# Patient Record
Sex: Female | Born: 1995 | Race: Black or African American | Hispanic: No | Marital: Single | Smoking: Current every day smoker
Health system: Southern US, Community
[De-identification: ages and names within clinical notes are randomized; demographics above are authoritative.]

## PROBLEM LIST (undated history)

## (undated) ENCOUNTER — Inpatient Hospital Stay (HOSPITAL_COMMUNITY): Payer: Self-pay

## (undated) DIAGNOSIS — B999 Unspecified infectious disease: Secondary | ICD-10-CM

## (undated) DIAGNOSIS — J45909 Unspecified asthma, uncomplicated: Secondary | ICD-10-CM

## (undated) DIAGNOSIS — A749 Chlamydial infection, unspecified: Secondary | ICD-10-CM

## (undated) HISTORY — PX: NO PAST SURGERIES: SHX2092

---
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
## 2014-01-07 ENCOUNTER — Encounter: Payer: Self-pay | Admitting: Family Medicine

## 2014-01-07 ENCOUNTER — Ambulatory Visit (INDEPENDENT_AMBULATORY_CARE_PROVIDER_SITE_OTHER): Payer: Medicaid Other | Admitting: Family Medicine

## 2014-01-07 VITALS — BP 109/64 | HR 99 | Temp 97.8°F | Wt 183.9 lb

## 2014-01-07 DIAGNOSIS — F1920 Other psychoactive substance dependence, uncomplicated: Secondary | ICD-10-CM

## 2014-01-07 DIAGNOSIS — Z3480 Encounter for supervision of other normal pregnancy, unspecified trimester: Secondary | ICD-10-CM

## 2014-01-07 DIAGNOSIS — O99320 Drug use complicating pregnancy, unspecified trimester: Secondary | ICD-10-CM

## 2014-01-07 DIAGNOSIS — F1210 Cannabis abuse, uncomplicated: Secondary | ICD-10-CM

## 2014-01-07 DIAGNOSIS — Z3491 Encounter for supervision of normal pregnancy, unspecified, first trimester: Secondary | ICD-10-CM

## 2014-01-07 LAB — POCT URINALYSIS DIP (DEVICE)
Bilirubin Urine: NEGATIVE
GLUCOSE, UA: NEGATIVE mg/dL
HGB URINE DIPSTICK: NEGATIVE
Ketones, ur: NEGATIVE mg/dL
Nitrite: NEGATIVE
Protein, ur: 30 mg/dL — AB
Specific Gravity, Urine: 1.02 (ref 1.005–1.030)
UROBILINOGEN UA: 1 mg/dL (ref 0.0–1.0)
pH: 5.5 (ref 5.0–8.0)

## 2014-01-07 NOTE — Progress Notes (Signed)
S: 18 yo G1 @ 9883w2d here for ROBV - reviewed US report- complete and normal - no vb, lof, ctx. +FM  O: see flowsheet  A/P - doing well  - bleeding and PTL precautions discussed - f/u 4weeks for 28 week visit and labs.

## 2014-01-07 NOTE — Patient Instructions (Signed)
Second Trimester of Pregnancy The second trimester is from week 13 through week 28, months 4 through 6. The second trimester is often a time when you feel your best. Your body has also adjusted to being pregnant, and you begin to feel better physically. Usually, morning sickness has lessened or quit completely, you may have more energy, and you may have an increase in appetite. The second trimester is also a time when the fetus is growing rapidly. At the end of the sixth month, the fetus is about 9 inches long and weighs about 1 pounds. You will likely begin to feel the baby move (quickening) between 18 and 20 weeks of the pregnancy. BODY CHANGES Your body goes through many changes during pregnancy. The changes vary from woman to woman.   Your weight will continue to increase. You will notice your lower abdomen bulging out.  You may begin to get stretch marks on your hips, abdomen, and breasts.  You may develop headaches that can be relieved by medicines approved by your caregiver.  You may urinate more often because the fetus is pressing on your bladder.  You may develop or continue to have heartburn as a result of your pregnancy.  You may develop constipation because certain hormones are causing the muscles that push waste through your intestines to slow down.  You may develop hemorrhoids or swollen, bulging veins (varicose veins).  You may have back pain because of the weight gain and pregnancy hormones relaxing your joints between the bones in your pelvis and as a result of a shift in weight and the muscles that support your balance.  Your breasts will continue to grow and be tender.  Your gums may bleed and may be sensitive to brushing and flossing.  Dark spots or blotches (chloasma, mask of pregnancy) may develop on your face. This will likely fade after the baby is born.  A dark line from your belly button to the pubic area (linea nigra) may appear. This will likely fade after the  baby is born. WHAT TO EXPECT AT YOUR PRENATAL VISITS During a routine prenatal visit:  You will be weighed to make sure you and the fetus are growing normally.  Your blood pressure will be taken.  Your abdomen will be measured to track your baby's growth.  The fetal heartbeat will be listened to.  Any test results from the previous visit will be discussed. Your caregiver may ask you:  How you are feeling.  If you are feeling the baby move.  If you have had any abnormal symptoms, such as leaking fluid, bleeding, severe headaches, or abdominal cramping.  If you have any questions. Other tests that may be performed during your second trimester include:  Blood tests that check for:  Low iron levels (anemia).  Gestational diabetes (between 24 and 28 weeks).  Rh antibodies.  Urine tests to check for infections, diabetes, or protein in the urine.  An ultrasound to confirm the proper growth and development of the baby.  An amniocentesis to check for possible genetic problems.  Fetal screens for spina bifida and Down syndrome. HOME CARE INSTRUCTIONS   Avoid all smoking, herbs, alcohol, and unprescribed drugs. These chemicals affect the formation and growth of the baby.  Follow your caregiver's instructions regarding medicine use. There are medicines that are either safe or unsafe to take during pregnancy.  Exercise only as directed by your caregiver. Experiencing uterine cramps is a good sign to stop exercising.  Continue to eat regular,   healthy meals.  Wear a good support bra for breast tenderness.  Do not use hot tubs, steam rooms, or saunas.  Wear your seat belt at all times when driving.  Avoid raw meat, uncooked cheese, cat litter boxes, and soil used by cats. These carry germs that can cause birth defects in the baby.  Take your prenatal vitamins.  Try taking a stool softener (if your caregiver approves) if you develop constipation. Eat more high-fiber foods,  such as fresh vegetables or fruit and whole grains. Drink plenty of fluids to keep your urine clear or pale yellow.  Take warm sitz baths to soothe any pain or discomfort caused by hemorrhoids. Use hemorrhoid cream if your caregiver approves.  If you develop varicose veins, wear support hose. Elevate your feet for 15 minutes, 3 4 times a day. Limit salt in your diet.  Avoid heavy lifting, wear low heel shoes, and practice good posture.  Rest with your legs elevated if you have leg cramps or low back pain.  Visit your dentist if you have not gone yet during your pregnancy. Use a soft toothbrush to brush your teeth and be gentle when you floss.  A sexual relationship may be continued unless your caregiver directs you otherwise.  Continue to go to all your prenatal visits as directed by your caregiver. SEEK MEDICAL CARE IF:   You have dizziness.  You have mild pelvic cramps, pelvic pressure, or nagging pain in the abdominal area.  You have persistent nausea, vomiting, or diarrhea.  You have a bad smelling vaginal discharge.  You have pain with urination. SEEK IMMEDIATE MEDICAL CARE IF:   You have a fever.  You are leaking fluid from your vagina.  You have spotting or bleeding from your vagina.  You have severe abdominal cramping or pain.  You have rapid weight gain or loss.  You have shortness of breath with chest pain.  You notice sudden or extreme swelling of your face, hands, ankles, feet, or legs.  You have not felt your baby move in over an hour.  You have severe headaches that do not go away with medicine.  You have vision changes. Document Released: 08/15/2001 Document Revised: 04/23/2013 Document Reviewed: 10/22/2012 [employer] Patient Information 2014 [employer].  

## 2014-01-13 ENCOUNTER — Other Ambulatory Visit: Payer: Medicaid Other

## 2014-01-13 ENCOUNTER — Telehealth: Payer: Self-pay | Admitting: Obstetrics & Gynecology

## 2014-01-13 NOTE — Telephone Encounter (Signed)
Called patient and left message about her appointment she missed, and that she should call us back to reschedule.

## 2014-01-17 ENCOUNTER — Encounter: Payer: Self-pay | Admitting: *Deleted

## 2014-02-04 ENCOUNTER — Encounter: Payer: Self-pay | Admitting: Family Medicine

## 2014-02-04 ENCOUNTER — Ambulatory Visit (INDEPENDENT_AMBULATORY_CARE_PROVIDER_SITE_OTHER): Payer: Medicaid Other | Admitting: Family Medicine

## 2014-02-04 VITALS — BP 118/69 | HR 96 | Temp 97.0°F | Wt 190.4 lb

## 2014-02-04 DIAGNOSIS — Z3480 Encounter for supervision of other normal pregnancy, unspecified trimester: Secondary | ICD-10-CM

## 2014-02-04 DIAGNOSIS — Z3491 Encounter for supervision of normal pregnancy, unspecified, first trimester: Secondary | ICD-10-CM

## 2014-02-04 DIAGNOSIS — Z3490 Encounter for supervision of normal pregnancy, unspecified, unspecified trimester: Secondary | ICD-10-CM

## 2014-02-04 DIAGNOSIS — Z23 Encounter for immunization: Secondary | ICD-10-CM

## 2014-02-04 MED ORDER — TETANUS-DIPHTH-ACELL PERTUSSIS 5-2.5-18.5 LF-MCG/0.5 IM SUSP: 0.5000 mL | Freq: Once | INTRAMUSCULAR | Status: DC

## 2014-02-04 MED ORDER — BENZOYL PEROXIDE 2.5 % EX CREA: 1.0000 "application " | TOPICAL_CREAM | Freq: Every day | CUTANEOUS | Status: DC

## 2014-02-04 NOTE — Progress Notes (Signed)
+  FM, no LOF, no vb, no ctx Back and abdominal soreness, not contx. Pt attributes to sleeping position, improves with movement  Carla Rollins is a 18 y.o. G1P0000 at [redacted]w[redacted]d by early [place] here for ROB visit.  Discussed with Patient:  -Plans to breast feed.  All questions answered. -Continue prenatal vitamins. - Reviewed genetics screen (Quad screen  -Reviewed fetal kick counts (Pt to perform daily at a time when the baby is active, lie laterally with both hands on belly in quiet room and count all movements (hiccups, shoulder rolls, obvious kicks, etc); pt is to report to clinic or MAU for less than 10 movements felt in a one hour time period-pt told as soon as she counts 10 movements the count is complete.)  - Routine precautions discussed (depression, infection s/s).   Patient provided with all pertinent phone numbers for emergencies. - RTC for any VB, regular, painful cramps/ctxs occurring at a rate of >2/10 min, fever (100.5 or higher), n/v/d, any pain that is unresolving or worsening, LOF, decreased fetal movement, CP, SOB, edema  Problems: Patient Active Problem List   Diagnosis Date Noted  . Marijuana abuse 10/17/2013  . Supervision of normal pregnancy in first trimester 10/09/2013  . PYELONEPHRITIS 12/08/2009  . OVERWEIGHT 07/27/2009  . 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 ALLERGIC RHINITIS WITH CONJUNCTIVITIS 07/27/2009  . ASTHMA 07/27/2009  . MENORRHAGIA 07/27/2009  . ECZEMA 07/27/2009    To Do: 1. Glucose tolerance test ordered.  Patient will draw in clinic.  Will f/u test and amend plan based on results. 2. CBC, labs and antibody screen ordered. 3.  Benzyl Peroxide for Acne  [ ]  Vaccines: recd [ ]  BCM: mirena  Edu: [x ] PTL precautions; [ ]  BF class; [ ]  childbirth class; [ ]   BF counseling;

## 2014-02-04 NOTE — Progress Notes (Signed)
Pt reports pain in abd/back for the past 3 days

## 2014-02-04 NOTE — Patient Instructions (Signed)
Third Trimester of Pregnancy The third trimester is from week 29 through week 42, months 7 through 9. The third trimester is a time when the fetus is growing rapidly. At the end of the ninth month, the fetus is about 20 inches in length and weighs 6 10 pounds.  BODY CHANGES Your body goes through many changes during pregnancy. The changes vary from woman to woman.   Your weight will continue to increase. You can expect to gain 25 35 pounds (11 16 kg) by the end of the pregnancy.  You may begin to get stretch marks on your hips, abdomen, and breasts.  You may urinate more often because the fetus is moving lower into your pelvis and pressing on your bladder.  You may develop or continue to have heartburn as a result of your pregnancy.  You may develop constipation because certain hormones are causing the muscles that push waste through your intestines to slow down.  You may develop hemorrhoids or swollen, bulging veins (varicose veins).  You may have pelvic pain because of the weight gain and pregnancy hormones relaxing your joints between the bones in your pelvis. Back aches may result from over exertion of the muscles supporting your posture.  Your breasts will continue to grow and be tender. A yellow discharge may leak from your breasts called colostrum.  Your belly button may stick out.  You may feel short of breath because of your expanding uterus.  You may notice the fetus "dropping," or moving lower in your abdomen.  You may have a bloody mucus discharge. This usually occurs a few days to a week before labor begins.  Your cervix becomes thin and soft (effaced) near your due date. WHAT TO EXPECT AT YOUR PRENATAL EXAMS  You will have prenatal exams every 2 weeks until week 36. Then, you will have weekly prenatal exams. During a routine prenatal visit:  You will be weighed to make sure you and the fetus are growing normally.  Your blood pressure is taken.  Your abdomen will be  measured to track your baby's growth.  The fetal heartbeat will be listened to.  Any test results from the previous visit will be discussed.  You may have a cervical check near your due date to see if you have effaced. At around 36 weeks, your caregiver will check your cervix. At the same time, your caregiver will also perform a test on the secretions of the vaginal tissue. This test is to determine if a type of bacteria, Group B streptococcus, is present. Your caregiver will explain this further. Your caregiver may ask you:  What your birth plan is.  How you are feeling.  If you are feeling the baby move.  If you have had any abnormal symptoms, such as leaking fluid, bleeding, severe headaches, or abdominal cramping.  If you have any questions. Other tests or screenings that may be performed during your third trimester include:  Blood tests that check for low iron levels (anemia).  Fetal testing to check the health, activity level, and growth of the fetus. Testing is done if you have certain medical conditions or if there are problems during the pregnancy. FALSE LABOR You may feel small, irregular contractions that eventually go away. These are called Braxton Hicks contractions, or false labor. Contractions may last for hours, days, or even weeks before true labor sets in. If contractions come at regular intervals, intensify, or become painful, it is best to be seen by your caregiver.  SIGNS OF LABOR   Menstrual-like cramps.  Contractions that are 5 minutes apart or less.  Contractions that start on the top of the uterus and spread down to the lower abdomen and back.  A sense of increased pelvic pressure or back pain.  A watery or bloody mucus discharge that comes from the vagina. If you have any of these signs before the 37th week of pregnancy, call your caregiver right away. You need to go to the hospital to get checked immediately. HOME CARE INSTRUCTIONS   Avoid all  smoking, herbs, alcohol, and unprescribed drugs. These chemicals affect the formation and growth of the baby.  Follow your caregiver's instructions regarding medicine use. There are medicines that are either safe or unsafe to take during pregnancy.  Exercise only as directed by your caregiver. Experiencing uterine cramps is a good sign to stop exercising.  Continue to eat regular, healthy meals.  Wear a good support bra for breast tenderness.  Do not use hot tubs, steam rooms, or saunas.  Wear your seat belt at all times when driving.  Avoid raw meat, uncooked cheese, cat litter boxes, and soil used by cats. These carry germs that can cause birth defects in the baby.  Take your prenatal vitamins.  Try taking a stool softener (if your caregiver approves) if you develop constipation. Eat more high-fiber foods, such as fresh vegetables or fruit and whole grains. Drink plenty of fluids to keep your urine clear or pale yellow.  Take warm sitz baths to soothe any pain or discomfort caused by hemorrhoids. Use hemorrhoid cream if your caregiver approves.  If you develop varicose veins, wear support hose. Elevate your feet for 15 minutes, 3 4 times a day. Limit salt in your diet.  Avoid heavy lifting, wear low heal shoes, and practice good posture.  Rest a lot with your legs elevated if you have leg cramps or low back pain.  Visit your dentist if you have not gone during your pregnancy. Use a soft toothbrush to brush your teeth and be gentle when you floss.  A sexual relationship may be continued unless your caregiver directs you otherwise.  Do not travel far distances unless it is absolutely necessary and only with the approval of your caregiver.  Take prenatal classes to understand, practice, and ask questions about the labor and delivery.  Make a trial run to the hospital.  Pack your hospital bag.  Prepare the baby's nursery.  Continue to go to all your prenatal visits as directed  by your caregiver. SEEK MEDICAL CARE IF:  You are unsure if you are in labor or if your water has broken.  You have dizziness.  You have mild pelvic cramps, pelvic pressure, or nagging pain in your abdominal area.  You have persistent nausea, vomiting, or diarrhea.  You have a bad smelling vaginal discharge.  You have pain with urination. SEEK IMMEDIATE MEDICAL CARE IF:   You have a fever.  You are leaking fluid from your vagina.  You have spotting or bleeding from your vagina.  You have severe abdominal cramping or pain.  You have rapid weight loss or gain.  You have shortness of breath with chest pain.  You notice sudden or extreme swelling of your face, hands, ankles, feet, or legs.  You have not felt your baby move in over an hour.  You have severe headaches that do not go away with medicine.  You have vision changes. Document Released: 08/15/2001 Document Revised: 04/23/2013 Document Reviewed:  10/22/2012 Exit[employer] Patient Information 2014 [employer]. Benzoyl Peroxide skin cream, gel or lotion What is this medicine? BENZOYL PEROXIDE (BEN zoe ill per OX ide) is used on the skin to treat mild to moderate acne. This medicine may be used for other purposes; ask your health care provider or pharmacist if you have questions. COMMON BRAND NAME(S): Acne Medication, Acne-10, Acneclear, Benprox , Benzac AC, Benzac W, Benzac, Benzagel, BenzaShave, BenzEFoam Ultra , BenzEFoam, BenzePrO, Benziq LS, Benziq, BP Cleansing Lotion, BP Gel, BPO, Brevoxyl-4, Brevoxyl-8, Clearasil Ultra, Clearasil Vanishing, Clearasil, Clearplex , Clearplex X, Clearskin, Clinac BPO, [place], [place], [place], [place], [place], [place] Acne Wash Kit, [place]-8 Acne Wash Kit, NeoBenz Micro Cream Plus Pack, NeoBenz Micro SD, [employer], NeoBenz, OC8, [place], PanOxyl AQ, PanOxyl Aqua, PanOxyl, RE Benzoyl Peroxide , Riax, Seba, Seba-Gel, Soluclenz Rx , Theroxide, Triaz, Zoderm What should I tell my  health care provider before I take this medicine? They need to know if you have any of these conditions: -asthma -skin disease, abrasions, irritation or infection -sunburn -an unusual or allergic reaction to benzoic acid, cinnamon, parabens, sulfites, other medicines, foods, dyes, or preservatives -pregnant or trying to get pregnant -breast-feeding How should I use this medicine? This medicine is for external use only. Do not take by mouth. Follow the directions on the prescription label. Before using, wash affected area with a gentle cleanser and pat dry. Do not apply to raw or irritated skin. Apply enough medicine to cover the area and rub in gently. Avoid getting medicine in your eyes, lips, nose, mouth, or other sensitive areas. Do not wash treated areas of skin for at least 1 hour after using the medicine. If you experience very dry and peeling skin or skin irritation, talk to your doctor or health care professional. Talk to your pediatrician or health care professional regarding the use of this medicine in children. Special care may be needed. Overdosage: If you think you have taken too much of this medicine contact a poison control center or emergency room at once. NOTE: This medicine is only for you. Do not share this medicine with others. What if I miss a dose? If you miss a dose, use it as soon as you can. If it is almost time for your next dose, use only that dose. Do not use double or extra doses. What may interact with this medicine? -adapalene -isotretinoin -salicylic acid or sulfur containing products -topical antibiotics such as clindamycin or erythromycin -tretinoin This list may not describe all possible interactions. Give your health care provider a list of all the medicines, herbs, non-prescription drugs, or dietary supplements you use. Also tell them if you smoke, drink alcohol, or use illegal drugs. Some items may interact with your medicine. What should I watch for while  using this medicine? Your acne may get worse during the first few weeks of treatment, and then start to get better. It may take 8 to 12 weeks before you see the full effect. If you do not see any improvement within 4 to 6 weeks, call your doctor or health care professional. Once you see a decrease in your acne, you may need to continue to use this medicine to control it. Do not use products that may dry the skin like medicated cosmetics, products that contain alcohol, or abrasive soaps or cleaners. Do not use other acne or skin treatment on the same area that you use this medicine unless your doctor or health care professional tells you to. If you use  these together they can cause severe skin irritation. This medicine can make you more sensitive to the sun. Keep out of the sun. If you cannot avoid being in the sun, wear protective clothing and use sunscreen. Do not use sun lamps or tanning beds/booths. This medicine may bleach hair or colored fabrics. Avoid getting the medicine on your clothes. What side effects may I notice from receiving this medicine? Side effects that you should report to your doctor or health care professional as soon as possible: -allergic reactions like skin rash, itching or hives, swelling of the face, lips, or tongue -severe burning, itching, reddening, crusting, or swelling of the treated areas Side effects that usually do not require medical attention (report to your doctor or health care professional if they continue or are bothersome): -increased sensitivity to the sun -mild burning or stinging of the treated areas -red, inflamed, and irritated skin This list may not describe all possible side effects. Call your doctor for medical advice about side effects. You may report side effects to FDA at 1-800-FDA-1088. Where should I keep my medicine? Keep out of the reach of children. Store at room temperature between 15 and 30 degrees C (59 and 86 degrees F). Throw away any  unused medication after the expiration date. NOTE: This sheet is a summary. It may not cover all possible information. If you have questions about this medicine, talk to your doctor, pharmacist, or health care provider.  2014, [employer]. (2007-11-20 16:11:05)

## 2014-02-05 LAB — CBC
HCT: 28.5 % — ABNORMAL LOW (ref 36.0–46.0)
Hemoglobin: 9.7 g/dL — ABNORMAL LOW (ref 12.0–15.0)
MCH: 29.4 pg (ref 26.0–34.0)
MCHC: 34 g/dL (ref 30.0–36.0)
MCV: 86.4 fL (ref 78.0–100.0)
PLATELETS: 256 10*3/uL (ref 150–400)
RBC: 3.3 MIL/uL — ABNORMAL LOW (ref 3.87–5.11)
RDW: 13.1 % (ref 11.5–15.5)
WBC: 10.2 10*3/uL (ref 4.0–10.5)

## 2014-02-05 LAB — POCT URINALYSIS DIP (DEVICE)
Bilirubin Urine: NEGATIVE
Glucose, UA: NEGATIVE mg/dL
Hgb urine dipstick: NEGATIVE
Ketones, ur: NEGATIVE mg/dL
NITRITE: NEGATIVE
Protein, ur: NEGATIVE mg/dL
Specific Gravity, Urine: 1.02 (ref 1.005–1.030)
Urobilinogen, UA: 0.2 mg/dL (ref 0.0–1.0)
pH: 7 (ref 5.0–8.0)

## 2014-02-05 LAB — RPR

## 2014-02-05 LAB — HIV ANTIBODY (ROUTINE TESTING W REFLEX): HIV 1&2 Ab, 4th Generation: NONREACTIVE

## 2014-02-05 LAB — GLUCOSE TOLERANCE, 1 HOUR (50G) W/O FASTING: Glucose, 1 Hour GTT: 78 mg/dL (ref 70–140)

## 2014-02-17 ENCOUNTER — Encounter: Payer: Self-pay | Admitting: Obstetrics and Gynecology

## 2014-02-17 ENCOUNTER — Ambulatory Visit (INDEPENDENT_AMBULATORY_CARE_PROVIDER_SITE_OTHER): Payer: Medicaid Other | Admitting: Obstetrics and Gynecology

## 2014-02-17 VITALS — BP 124/64 | HR 100 | Temp 97.6°F | Wt 190.9 lb

## 2014-02-17 DIAGNOSIS — Z23 Encounter for immunization: Secondary | ICD-10-CM

## 2014-02-17 DIAGNOSIS — Z3480 Encounter for supervision of other normal pregnancy, unspecified trimester: Secondary | ICD-10-CM

## 2014-02-17 DIAGNOSIS — Z3491 Encounter for supervision of normal pregnancy, unspecified, first trimester: Secondary | ICD-10-CM

## 2014-02-17 LAB — POCT URINALYSIS DIP (DEVICE)
Glucose, UA: NEGATIVE mg/dL
Hgb urine dipstick: NEGATIVE
Ketones, ur: NEGATIVE mg/dL
LEUKOCYTES UA: NEGATIVE
Nitrite: NEGATIVE
PROTEIN: 30 mg/dL — AB
Specific Gravity, Urine: 1.025 (ref 1.005–1.030)
Urobilinogen, UA: 1 mg/dL (ref 0.0–1.0)
pH: 7 (ref 5.0–8.0)

## 2014-02-17 NOTE — Progress Notes (Signed)
Has ice PICA. Takes Gummy vits. hgb 9.7, MCV 86> add iron supplement bid. Proteinuria> No UTI sx; check urine C&S Has quit work. No concerns.  Discussed Nexplanon, breastfeeding, circ.  UDS done

## 2014-02-17 NOTE — Patient Instructions (Signed)
Breastfeeding Deciding to breastfeed is one of the best choices you can make for you and your baby. A change in hormones during pregnancy causes your breast tissue to grow and increases the number and size of your milk ducts. These hormones also allow proteins, sugars, and fats from your blood supply to make breast milk in your milk-producing glands. Hormones prevent breast milk from being released before your baby is born as well as prompt milk flow after birth. Once breastfeeding has begun, thoughts of your baby, as well as his or her sucking or crying, can stimulate the release of milk from your milk-producing glands.  BENEFITS OF BREASTFEEDING For Your Baby  Your first milk (colostrum) helps your baby's digestive system function better.   There are antibodies in your milk that help your baby fight off infections.   Your baby has a lower incidence of asthma, allergies, and sudden infant death syndrome.   The nutrients in breast milk are better for your baby than infant formulas and are designed uniquely for your baby's needs.   Breast milk improves your baby's brain development.   Your baby is less likely to develop other conditions, such as childhood obesity, asthma, or type 2 diabetes mellitus.  For You   Breastfeeding helps to create a very special bond between you and your baby.   Breastfeeding is convenient. Breast milk is always available at the correct temperature and costs nothing.   Breastfeeding helps to burn calories and helps you lose the weight gained during pregnancy.   Breastfeeding makes your uterus contract to its prepregnancy size faster and slows bleeding (lochia) after you give birth.   Breastfeeding helps to lower your risk of developing type 2 diabetes mellitus, osteoporosis, and breast or ovarian cancer later in life. SIGNS THAT YOUR BABY IS HUNGRY Early Signs of Hunger  Increased alertness or activity.  Stretching.  Movement of the head from  side to side.  Movement of the head and opening of the mouth when the corner of the mouth or cheek is stroked (rooting).  Increased sucking sounds, smacking lips, cooing, sighing, or squeaking.  Hand-to-mouth movements.  Increased sucking of fingers or hands. Late Signs of Hunger  Fussing.  Intermittent crying. Extreme Signs of Hunger Signs of extreme hunger will require calming and consoling before your baby will be able to breastfeed successfully. Do not wait for the following signs of extreme hunger to occur before you initiate breastfeeding:   Restlessness.  A loud, strong cry.   Screaming. BREASTFEEDING BASICS Breastfeeding Initiation  Find a comfortable place to sit or lie down, with your neck and back well supported.  Place a pillow or rolled up blanket under your baby to bring him or her to the level of your breast (if you are seated). Nursing pillows are specially designed to help support your arms and your baby while you breastfeed.  Make sure that your baby's abdomen is facing your abdomen.   Gently massage your breast. With your fingertips, massage from your chest wall toward your nipple in a circular motion. This encourages milk flow. You may need to continue this action during the feeding if your milk flows slowly.  Support your breast with 4 fingers underneath and your thumb above your nipple. Make sure your fingers are well away from your nipple and your baby's mouth.   Stroke your baby's lips gently with your finger or nipple.   When your baby's mouth is open wide enough, quickly bring your baby to your   breast, placing your entire nipple and as much of the colored area around your nipple (areola) as possible into your baby's mouth.   More areola should be visible above your baby's upper lip than below the lower lip.   Your baby's tongue should be between his or her lower gum and your breast.   Ensure that your baby's mouth is correctly positioned  around your nipple (latched). Your baby's lips should create a seal on your breast and be turned out (everted).  It is common for your baby to suck about 2 3 minutes in order to start the flow of breast milk. Latching Teaching your baby how to latch on to your breast properly is very important. An improper latch can cause nipple pain and decreased milk supply for you and poor weight gain in your baby. Also, if your baby is not latched onto your nipple properly, he or she may swallow some air during feeding. This can make your baby fussy. Burping your baby when you switch breasts during the feeding can help to get rid of the air. However, teaching your baby to latch on properly is still the best way to prevent fussiness from swallowing air while breastfeeding. Signs that your baby has successfully latched on to your nipple:    Silent tugging or silent sucking, without causing you pain.   Swallowing heard between every 3 4 sucks.    Muscle movement above and in front of his or her ears while sucking.  Signs that your baby has not successfully latched on to nipple:   Sucking sounds or smacking sounds from your baby while breastfeeding.  Nipple pain. If you think your baby has not latched on correctly, slip your finger into the corner of your baby's mouth to break the suction and place it between your baby's gums. Attempt breastfeeding initiation again. Signs of Successful Breastfeeding Signs from your baby:   A gradual decrease in the number of sucks or complete cessation of sucking.   Falling asleep.   Relaxation of his or her body.   Retention of a small amount of milk in his or her mouth.   Letting go of your breast by himself or herself. Signs from you:  Breasts that have increased in firmness, weight, and size 1 3 hours after feeding.   Breasts that are softer immediately after breastfeeding.  Increased milk volume, as well as a change in milk consistency and color by  the 5th day of breastfeeding.   Nipples that are not sore, cracked, or bleeding. Signs That Your Randel Books is Getting Enough Milk  Wetting at least 3 diapers in a 24-hour period. The urine should be clear and pale yellow by age 64411 days.  At least 3 stools in a 24-hour period by age 64411 days. The stool should be soft and yellow.  At least 3 stools in a 24-hour period by age 644 days. The stool should be seedy and yellow.  No loss of weight greater than 10% of birth weight during the first 22 days of age.  Average weight gain of 4 7 ounces (120 210 mL) per week after age 64 days.  Consistent daily weight gain by age 60 days, without weight loss after the age of 2 weeks. After a feeding, your baby may spit up a small amount. This is common. BREASTFEEDING FREQUENCY AND DURATION Frequent feeding will help you make more milk and can prevent sore nipples and breast engorgement. Breastfeed when you feel the need to reduce  the fullness of your breasts or when your baby shows signs of hunger. This is called "breastfeeding on demand." Avoid introducing a pacifier to your baby while you are working to establish breastfeeding (the first 4 6 weeks after your baby is born). After this time you may choose to use a pacifier. Research has shown that pacifier use during the first year of a baby's life decreases the risk of sudden infant death syndrome (SIDS). Allow your baby to feed on each breast as long as he or she wants. Breastfeed until your baby is finished feeding. When your baby unlatches or falls asleep while feeding from the first breast, offer the second breast. Because newborns are often sleepy in the first few weeks of life, you may need to awaken your baby to get him or her to feed. Breastfeeding times will vary from baby to baby. However, the following rules can serve as a guide to help you ensure that your baby is properly fed:  Newborns (babies 4 weeks of age or younger) may breastfeed every 1 3  hours.  Newborns should not go longer than 3 hours during the day or 5 hours during the night without breastfeeding.  You should breastfeed your baby a minimum of 8 times in a 24-hour period until you begin to introduce solid foods to your baby at around 6 months of age. BREAST MILK PUMPING Pumping and storing breast milk allows you to ensure that your baby is exclusively fed your breast milk, even at times when you are unable to breastfeed. This is especially important if you are going back to work while you are still breastfeeding or when you are not able to be present during feedings. Your lactation consultant can give you guidelines on how long it is safe to store breast milk.  A breast pump is a machine that allows you to pump milk from your breast into a sterile bottle. The pumped breast milk can then be stored in a refrigerator or freezer. Some breast pumps are operated by hand, while others use electricity. Ask your lactation consultant which type will work best for you. Breast pumps can be purchased, but some hospitals and breastfeeding support groups lease breast pumps on a monthly basis. A lactation consultant can teach you how to hand express breast milk, if you prefer not to use a pump.  CARING FOR YOUR BREASTS WHILE YOU BREASTFEED Nipples can become dry, cracked, and sore while breastfeeding. The following recommendations can help keep your breasts moisturized and healthy:  Avoid using soap on your nipples.   Wear a supportive bra. Although not required, special nursing bras and tank tops are designed to allow access to your breasts for breastfeeding without taking off your entire bra or top. Avoid wearing underwire style bras or extremely tight bras.  Air dry your nipples for 3 4minutes after each feeding.   Use only cotton bra pads to absorb leaked breast milk. Leaking of breast milk between feedings is normal.   Use lanolin on your nipples after breastfeeding. Lanolin helps to  maintain your skin's normal moisture barrier. If you use pure lanolin you do not need to wash it off before feeding your baby again. Pure lanolin is not toxic to your baby. You may also hand express a few drops of breast milk and gently massage that milk into your nipples and allow the milk to air dry. In the first few weeks after giving birth, some women experience extremely full breasts (engorgement). Engorgement can make   your breasts feel heavy, warm, and tender to the touch. Engorgement peaks within 3 5 days after you give birth. The following recommendations can help ease engorgement:  Completely empty your breasts while breastfeeding or pumping. You may want to start by applying warm, moist heat (in the shower or with warm water-soaked hand towels) just before feeding or pumping. This increases circulation and helps the milk flow. If your baby does not completely empty your breasts while breastfeeding, pump any extra milk after he or she is finished.  Wear a snug bra (nursing or regular) or tank top for 1 2 days to signal your body to slightly decrease milk production.  Apply ice packs to your breasts, unless this is too uncomfortable for you.  Make sure that your baby is latched on and positioned properly while breastfeeding. If engorgement persists after 48 hours of following these recommendations, contact your health care provider or a Advertising copywriterlactation consultant. OVERALL HEALTH CARE RECOMMENDATIONS WHILE BREASTFEEDING  Eat healthy foods. Alternate between meals and snacks, eating 3 of each per day. Because what you eat affects your breast milk, some of the foods may make your baby more irritable than usual. Avoid eating these foods if you are sure that they are negatively affecting your baby.  Drink milk, fruit juice, and water to satisfy your thirst (about 10 glasses a day).   Rest often, relax, and continue to take your prenatal vitamins to prevent fatigue, stress, and anemia.  Continue  breast self-awareness checks.  Avoid chewing and smoking tobacco.  Avoid alcohol and drug use. Some medicines that may be harmful to your baby can pass through breast milk. It is important to ask your health care provider before taking any medicine, including all over-the-counter and prescription medicine as well as vitamin and herbal supplements. It is possible to become pregnant while breastfeeding. If birth control is desired, ask your health care provider about options that will be safe for your baby. SEEK MEDICAL CARE IF:   You feel like you want to stop breastfeeding or have become frustrated with breastfeeding.  You have painful breasts or nipples.  Your nipples are cracked or bleeding.  Your breasts are red, tender, or warm.  You have a swollen area on either breast.  You have a fever or chills.  You have nausea or vomiting.  You have drainage other than breast milk from your nipples.  Your breasts do not become full before feedings by the 5th day after you give birth.  You feel sad and depressed.  Your baby is too sleepy to eat well.  Your baby is having trouble sleeping.   Your baby is wetting less than 3 diapers in a 24-hour period.  Your baby has less than 3 stools in a 24-hour period.  Your baby's skin or the white part of his or her eyes becomes yellow.   Your baby is not gaining weight by 35 days of age. SEEK IMMEDIATE MEDICAL CARE IF:   Your baby is overly tired (lethargic) and does not want to wake up and feed.  Your baby develops an unexplained fever. Document Released: 08/21/2005 Document Revised: 04/23/2013 Document Reviewed: 02/12/2013 [facility] Patient Information 2014 [place], [place]. Etonogestrel implant What is this medicine? ETONOGESTREL (et oh noe JES trel) is a contraceptive (birth control) device. It is used to prevent pregnancy. It can be used for up to 3 years. This medicine may be used for other purposes; ask your health care provider  or pharmacist if you have questions.  COMMON BRAND NAME(S): Implanon, Nexplanon  What should I tell my health care provider before I take this medicine? They need to know if you have any of these conditions: -abnormal vaginal bleeding -blood vessel disease or blood clots -cancer of the breast, cervix, or liver -depression -diabetes -gallbladder disease -headaches -heart disease or recent heart attack -high blood pressure -high cholesterol -kidney disease -liver disease -renal disease -seizures -tobacco smoker -an unusual or allergic reaction to etonogestrel, other hormones, anesthetics or antiseptics, medicines, foods, dyes, or preservatives -pregnant or trying to get pregnant -breast-feeding How should I use this medicine? This device is inserted just under the skin on the inner side of your upper arm by a health care professional. Talk to your pediatrician regarding the use of this medicine in children. Special care may be needed. Overdosage: If you think you've taken too much of this medicine contact a poison control center or emergency room at once. Overdosage: If you think you have taken too much of this medicine contact a poison control center or emergency room at once. NOTE: This medicine is only for you. Do not share this medicine with others. What if I miss a dose? This does not apply. What may interact with this medicine? Do not take this medicine with any of the following medications: -amprenavir -bosentan -fosamprenavir This medicine may also interact with the following medications: -barbiturate medicines for inducing sleep or treating seizures -certain medicines for fungal infections like ketoconazole and itraconazole -griseofulvin -medicines to treat seizures like carbamazepine, felbamate, oxcarbazepine, phenytoin, topiramate -modafinil -phenylbutazone -rifampin -some medicines to treat HIV infection like atazanavir, indinavir, lopinavir, nelfinavir,  tipranavir, ritonavir -St. John's wort This list may not describe all possible interactions. Give your health care provider a list of all the medicines, herbs, non-prescription drugs, or dietary supplements you use. Also tell them if you smoke, drink alcohol, or use illegal drugs. Some items may interact with your medicine. What should I watch for while using this medicine? This product does not protect you against HIV infection (AIDS) or other sexually transmitted diseases. You should be able to feel the implant by pressing your fingertips over the skin where it was inserted. Tell your doctor if you cannot feel the implant. What side effects may I notice from receiving this medicine? Side effects that you should report to your doctor or health care professional as soon as possible: -allergic reactions like skin rash, itching or hives, swelling of the face, lips, or tongue -breast lumps -changes in vision -confusion, trouble speaking or understanding -dark urine -depressed mood -general ill feeling or flu-like symptoms -light-colored stools -loss of appetite, nausea -right upper belly pain -severe headaches -severe pain, swelling, or tenderness in the abdomen -shortness of breath, chest pain, swelling in a leg -signs of pregnancy -sudden numbness or weakness of the face, arm or leg -trouble walking, dizziness, loss of balance or coordination -unusual vaginal bleeding, discharge -unusually weak or tired -yellowing of the eyes or skin Side effects that usually do not require medical attention (Report these to your doctor or health care professional if they continue or are bothersome.): -acne -breast pain -changes in weight -cough -fever or chills -headache -irregular menstrual bleeding -itching, burning, and vaginal discharge -pain or difficulty passing urine -sore throat This list may not describe all possible side effects. Call your doctor for medical advice about side effects.  You may report side effects to FDA at 1-800-FDA-1088. Where should I keep my medicine? This drug is given in a hospital or clinic  and will not be stored at home. NOTE: This sheet is a summary. It may not cover all possible information. If you have questions about this medicine, talk to your doctor, pharmacist, or health care provider.  2014, [employer]. (2012-02-26 15:37:45)

## 2014-02-19 ENCOUNTER — Encounter (HOSPITAL_COMMUNITY): Payer: Self-pay | Admitting: *Deleted

## 2014-02-19 ENCOUNTER — Inpatient Hospital Stay (HOSPITAL_COMMUNITY)
Admission: AD | Admit: 2014-02-19 | Discharge: 2014-02-19 | Disposition: A | Discharge status: Home / Self Care | Payer: Medicaid Other | Source: Ambulatory Visit | Attending: Obstetrics & Gynecology | Admitting: Obstetrics & Gynecology

## 2014-02-19 DIAGNOSIS — Z3491 Encounter for supervision of normal pregnancy, unspecified, first trimester: Secondary | ICD-10-CM

## 2014-02-19 DIAGNOSIS — Z833 Family history of diabetes mellitus: Secondary | ICD-10-CM | POA: Insufficient documentation

## 2014-02-19 DIAGNOSIS — O9989 Other specified diseases and conditions complicating pregnancy, childbirth and the puerperium: Principal | ICD-10-CM

## 2014-02-19 DIAGNOSIS — M549 Dorsalgia, unspecified: Secondary | ICD-10-CM | POA: Insufficient documentation

## 2014-02-19 DIAGNOSIS — O99891 Other specified diseases and conditions complicating pregnancy: Secondary | ICD-10-CM | POA: Insufficient documentation

## 2014-02-19 DIAGNOSIS — Z87891 Personal history of nicotine dependence: Secondary | ICD-10-CM | POA: Insufficient documentation

## 2014-02-19 LAB — URINALYSIS, ROUTINE W REFLEX MICROSCOPIC
BILIRUBIN URINE: NEGATIVE
Glucose, UA: NEGATIVE mg/dL
Hgb urine dipstick: NEGATIVE
Ketones, ur: 15 mg/dL — AB
Leukocytes, UA: NEGATIVE
NITRITE: NEGATIVE
PH: 6.5 (ref 5.0–8.0)
Protein, ur: 30 mg/dL — AB
SPECIFIC GRAVITY, URINE: 1.025 (ref 1.005–1.030)
Urobilinogen, UA: 2 mg/dL — ABNORMAL HIGH (ref 0.0–1.0)

## 2014-02-19 LAB — URINE CULTURE: Colony Count: 45000

## 2014-02-19 LAB — WET PREP, GENITAL
Clue Cells Wet Prep HPF POC: NONE SEEN
Trich, Wet Prep: NONE SEEN
Yeast Wet Prep HPF POC: NONE SEEN

## 2014-02-19 LAB — URINE MICROSCOPIC-ADD ON

## 2014-02-19 LAB — FETAL FIBRONECTIN: Fetal Fibronectin: NEGATIVE

## 2014-02-19 MED ORDER — CYCLOBENZAPRINE HCL 5 MG PO TABS
5.0000 mg | ORAL_TABLET | Freq: Three times a day (TID) | ORAL | Status: DC | PRN
Start: 2014-02-19 — End: 2014-04-23

## 2014-02-19 NOTE — MAU Note (Signed)
C/o back pain that started @ 1200 today; denies any spotting; no vaginal discharge;

## 2014-02-19 NOTE — MAU Provider Note (Signed)
Chief Complaint:  Back Pain   Carla Rollins is a 18 y.o.  G1P0000 with IUP at 7460w3d presenting for Back Pain Pain started in her lower back since this morning. The pain is crampy with no radiation up her back or down into buttocks. She hasn't taken any medication for her pain. The pain has been constant. She denies any trauma to her back. She hasn't done anything in recent days to suspect any injury. She denies any fever, nausea or vomiting. Voiding has been normal with normal bowel movements. She hasn't had pain like this previously in her pregnancy.   Denies any LOF or VB with good FM.   Menstrual History: OB History   Grav Para Term Preterm Abortions TAB SAB Ect Mult Living   1 0 0 0 0 0 0 0 0 0       Patient's last menstrual period was 07/05/2013.      Past Medical History  Diagnosis Date  . Asthma     Past Surgical History  Procedure Laterality Date  . No past surgeries      Family History  Problem Relation Age of Onset  . Diabetes Maternal Grandmother   . Cancer Maternal Grandfather     History  Substance Use Topics  . Smoking status: Former Smoker -- 1 years    Types: Cigarettes  . Smokeless tobacco: Never Used  . Alcohol Use: No     Comment: not during pregnancy     No Known Allergies  Facility-administered medications prior to admission  Medication Dose Route Frequency Jemya Depierro Last Rate Last Dose  . Tdap (BOOSTRIX) injection 0.5 mL  0.5 mL Intramuscular Once Minta BalsamMichael R Odom, MD       Prescriptions prior to admission  Medication Sig Dispense Refill  . acetaminophen (TYLENOL) 325 MG tablet Take 650 mg by mouth every 6 (six) hours as needed for moderate pain.      . cyclobenzaprine (FLEXERIL) 5 MG tablet Take 1 tablet (5 mg total) by mouth at bedtime as needed for muscle spasms.  30 tablet  0  . Prenatal Multivit-Min-Fe-FA (PRENATAL VITAMINS) 0.8 MG tablet Take 1 tablet by mouth daily.  30 tablet  12    Review of Systems - Negative except for what is  mentioned in HPI.  Physical Exam  Blood pressure 119/64, pulse 100, temperature 98.5 F (36.9 C), temperature source Oral, resp. rate 18, height 5\' 7"  (1.702 m), weight 86.546 kg (190 lb 12.8 oz), last menstrual period 07/05/2013. GENERAL: Well-developed, well-nourished female in no acute distress.  ABDOMEN: Soft, nontender, nondistended, gravid.  Back: No CVAT, TTP at Sacrum. EXTREMITIES: Nontender, no edema, 2+ distal pulses. Cervical Exam: Dilatation 0cm   Effacement Thick  Station -3   FHT:  Baseline rate 140 bpm   Variability moderate  Accelerations present   Decelerations none Contractions: none   Labs: Results for orders placed during the hospital encounter of 02/19/14 (from the past 24 hour(s))  URINALYSIS, ROUTINE W REFLEX MICROSCOPIC   Collection Time    02/19/14  2:30 PM      Result Value Ref Range   Color, Urine YELLOW  YELLOW   APPearance CLEAR  CLEAR   Specific Gravity, Urine 1.025  1.005 - 1.030   pH 6.5  5.0 - 8.0   Glucose, UA NEGATIVE  NEGATIVE mg/dL   Hgb urine dipstick NEGATIVE  NEGATIVE   Bilirubin Urine NEGATIVE  NEGATIVE   Ketones, ur 15 (*) NEGATIVE mg/dL   Protein, ur 30 (*) NEGATIVE  mg/dL   Urobilinogen, UA 2.0 (*) 0.0 - 1.0 mg/dL   Nitrite NEGATIVE  NEGATIVE   Leukocytes, UA NEGATIVE  NEGATIVE  URINE MICROSCOPIC-ADD ON   Collection Time    02/19/14  2:30 PM      Result Value Ref Range   Squamous Epithelial / LPF MANY (*) RARE   WBC, UA 3-6  <3 WBC/hpf   RBC / HPF 0-2  <3 RBC/hpf   Bacteria, UA FEW (*) RARE   Urine-Other MUCOUS PRESENT    WET PREP, GENITAL   Collection Time    02/19/14  4:00 PM      Result Value Ref Range   Yeast Wet Prep HPF POC NONE SEEN  NONE SEEN   Trich, Wet Prep NONE SEEN  NONE SEEN   Clue Cells Wet Prep HPF POC NONE SEEN  NONE SEEN   WBC, Wet Prep HPF POC FEW (*) NONE SEEN  FETAL FIBRONECTIN   Collection Time    02/19/14  4:00 PM      Result Value Ref Range   Fetal Fibronectin NEGATIVE  NEGATIVE    Imaging  Studies:  No results found.  Assessment: Carla Rollins is  18 y.o. G1P0000 at 5763w3d presents with Back Pain .  Plan: Discharge to home.   #back Pain: MSK most likely in nature. Fetal fibronectin negative and wet prep showing no signs of infection.  - f/u as scheduled.  - flexeril 5mg  PRN  #FWB: cat 1  Myra RudeSchmitz, Jeremy E 6/18/20155:05 PM   I spoke with and examined patient and agree with resident's note and plan of care.  Tawana ScaleMichael Ryan Odom, MD OB Fellow 02/19/2014 5:20 PM

## 2014-02-19 NOTE — MAU Note (Signed)
Pt reports she lost her mucus plug yesterday and si having menstrual like cramping in her back.

## 2014-02-19 NOTE — Discharge Instructions (Signed)
Third Trimester of Pregnancy The third trimester is from week 29 through week 42, months 7 through 9. The third trimester is a time when the fetus is growing rapidly. At the end of the ninth month, the fetus is about 20 inches in length and weighs 6-10 pounds.  BODY CHANGES Your body goes through many changes during pregnancy. The changes vary from woman to woman.   Your weight will continue to increase. You can expect to gain 25-35 pounds (11-16 kg) by the end of the pregnancy.  You may begin to get stretch marks on your hips, abdomen, and breasts.  You may urinate more often because the fetus is moving lower into your pelvis and pressing on your bladder.  You may develop or continue to have heartburn as a result of your pregnancy.  You may develop constipation because certain hormones are causing the muscles that push waste through your intestines to slow down.  You may develop hemorrhoids or swollen, bulging veins (varicose veins).  You may have pelvic pain because of the weight gain and pregnancy hormones relaxing your joints between the bones in your pelvis. Backaches may result from overexertion of the muscles supporting your posture.  You may have changes in your hair. These can include thickening of your hair, rapid growth, and changes in texture. Some women also have hair loss during or after pregnancy, or hair that feels dry or thin. Your hair will most likely return to normal after your baby is born.  Your breasts will continue to grow and be tender. A yellow discharge may leak from your breasts called colostrum.  Your belly button may stick out.  You may feel short of breath because of your expanding uterus.  You may notice the fetus "dropping," or moving lower in your abdomen.  You may have a bloody mucus discharge. This usually occurs a few days to a week before labor begins.  Your cervix becomes thin and soft (effaced) near your due date. WHAT TO EXPECT AT YOUR PRENATAL  EXAMS  You will have prenatal exams every 2 weeks until week 36. Then, you will have weekly prenatal exams. During a routine prenatal visit:  You will be weighed to make sure you and the fetus are growing normally.  Your blood pressure is taken.  Your abdomen will be measured to track your baby's growth.  The fetal heartbeat will be listened to.  Any test results from the previous visit will be discussed.  You may have a cervical check near your due date to see if you have effaced. At around 36 weeks, your caregiver will check your cervix. At the same time, your caregiver will also perform a test on the secretions of the vaginal tissue. This test is to determine if a type of bacteria, Group B streptococcus, is present. Your caregiver will explain this further. Your caregiver may ask you:  What your birth plan is.  How you are feeling.  If you are feeling the baby move.  If you have had any abnormal symptoms, such as leaking fluid, bleeding, severe headaches, or abdominal cramping.  If you have any questions. Other tests or screenings that may be performed during your third trimester include:  Blood tests that check for low iron levels (anemia).  Fetal testing to check the health, activity level, and growth of the fetus. Testing is done if you have certain medical conditions or if there are problems during the pregnancy. FALSE LABOR You may feel small, irregular contractions that   eventually go away. These are called Braxton Hicks contractions, or false labor. Contractions may last for hours, days, or even weeks before true labor sets in. If contractions come at regular intervals, intensify, or become painful, it is best to be seen by your caregiver.  SIGNS OF LABOR   Menstrual-like cramps.  Contractions that are 5 minutes apart or less.  Contractions that start on the top of the uterus and spread down to the lower abdomen and back.  A sense of increased pelvic pressure or back  pain.  A watery or bloody mucus discharge that comes from the vagina. If you have any of these signs before the 37th week of pregnancy, call your caregiver right away. You need to go to the hospital to get checked immediately. HOME CARE INSTRUCTIONS   Avoid all smoking, herbs, alcohol, and unprescribed drugs. These chemicals affect the formation and growth of the baby.  Follow your caregiver's instructions regarding medicine use. There are medicines that are either safe or unsafe to take during pregnancy.  Exercise only as directed by your caregiver. Experiencing uterine cramps is a good sign to stop exercising.  Continue to eat regular, healthy meals.  Wear a good support bra for breast tenderness.  Do not use hot tubs, steam rooms, or saunas.  Wear your seat belt at all times when driving.  Avoid raw meat, uncooked cheese, cat litter boxes, and soil used by cats. These carry germs that can cause birth defects in the baby.  Take your prenatal vitamins.  Try taking a stool softener (if your caregiver approves) if you develop constipation. Eat more high-fiber foods, such as fresh vegetables or fruit and whole grains. Drink plenty of fluids to keep your urine clear or pale yellow.  Take warm sitz baths to soothe any pain or discomfort caused by hemorrhoids. Use hemorrhoid cream if your caregiver approves.  If you develop varicose veins, wear support hose. Elevate your feet for 15 minutes, 3-4 times a day. Limit salt in your diet.  Avoid heavy lifting, wear low heal shoes, and practice good posture.  Rest a lot with your legs elevated if you have leg cramps or low back pain.  Visit your dentist if you have not gone during your pregnancy. Use a soft toothbrush to brush your teeth and be gentle when you floss.  A sexual relationship may be continued unless your caregiver directs you otherwise.  Do not travel far distances unless it is absolutely necessary and only with the approval  of your caregiver.  Take prenatal classes to understand, practice, and ask questions about the labor and delivery.  Make a trial run to the hospital.  Pack your hospital bag.  Prepare the baby's nursery.  Continue to go to all your prenatal visits as directed by your caregiver. SEEK MEDICAL CARE IF:  You are unsure if you are in labor or if your water has broken.  You have dizziness.  You have mild pelvic cramps, pelvic pressure, or nagging pain in your abdominal area.  You have persistent nausea, vomiting, or diarrhea.  You have a bad smelling vaginal discharge.  You have pain with urination. SEEK IMMEDIATE MEDICAL CARE IF:   You have a fever.  You are leaking fluid from your vagina.  You have spotting or bleeding from your vagina.  You have severe abdominal cramping or pain.  You have rapid weight loss or gain.  You have shortness of breath with chest pain.  You notice sudden or extreme swelling   of your face, hands, ankles, feet, or legs.  You have not felt your baby move in over an hour.  You have severe headaches that do not go away with medicine.  You have vision changes. Document Released: 08/15/2001 Document Revised: 08/26/2013 Document Reviewed: 10/22/2012 [employer] Patient Information 2015 [employer]. This information is not intended to replace advice given to you by your health care provider. Make sure you discuss any questions you have with your health care provider.  

## 2014-03-03 ENCOUNTER — Ambulatory Visit (INDEPENDENT_AMBULATORY_CARE_PROVIDER_SITE_OTHER): Payer: Medicaid Other | Admitting: Obstetrics and Gynecology

## 2014-03-03 VITALS — BP 107/65 | HR 94 | Temp 98.0°F | Wt 187.6 lb

## 2014-03-03 DIAGNOSIS — Z3480 Encounter for supervision of other normal pregnancy, unspecified trimester: Secondary | ICD-10-CM

## 2014-03-03 DIAGNOSIS — F1210 Cannabis abuse, uncomplicated: Secondary | ICD-10-CM

## 2014-03-03 DIAGNOSIS — Z3491 Encounter for supervision of normal pregnancy, unspecified, first trimester: Secondary | ICD-10-CM

## 2014-03-03 LAB — POCT URINALYSIS DIP (DEVICE)
BILIRUBIN URINE: NEGATIVE
GLUCOSE, UA: NEGATIVE mg/dL
Hgb urine dipstick: NEGATIVE
KETONES UR: NEGATIVE mg/dL
Leukocytes, UA: NEGATIVE
Nitrite: NEGATIVE
Protein, ur: 30 mg/dL — AB
Urobilinogen, UA: 2 mg/dL — ABNORMAL HIGH (ref 0.0–1.0)
pH: 6 (ref 5.0–8.0)

## 2014-03-03 NOTE — Patient Instructions (Signed)
Second Trimester of Pregnancy The second trimester is from week 13 through week 28, months 4 through 6. The second trimester is often a time when you feel your best. Your body has also adjusted to being pregnant, and you begin to feel better physically. Usually, morning sickness has lessened or quit completely, you may have more energy, and you may have an increase in appetite. The second trimester is also a time when the fetus is growing rapidly. At the end of the sixth month, the fetus is about 9 inches long and weighs about 1 pounds. You will likely begin to feel the baby move (quickening) between 18 and 20 weeks of the pregnancy. BODY CHANGES Your body goes through many changes during pregnancy. The changes vary from woman to woman.   Your weight will continue to increase. You will notice your lower abdomen bulging out.  You may begin to get stretch marks on your hips, abdomen, and breasts.  You may develop headaches that can be relieved by medicines approved by your health care provider.  You may urinate more often because the fetus is pressing on your bladder.  You may develop or continue to have heartburn as a result of your pregnancy.  You may develop constipation because certain hormones are causing the muscles that push waste through your intestines to slow down.  You may develop hemorrhoids or swollen, bulging veins (varicose veins).  You may have back pain because of the weight gain and pregnancy hormones relaxing your joints between the bones in your pelvis and as a result of a shift in weight and the muscles that support your balance.  Your breasts will continue to grow and be tender.  Your gums may bleed and may be sensitive to brushing and flossing.  Dark spots or blotches (chloasma, mask of pregnancy) may develop on your face. This will likely fade after the baby is born.  A dark line from your belly button to the pubic area (linea nigra) may appear. This will likely fade  after the baby is born.  You may have changes in your hair. These can include thickening of your hair, rapid growth, and changes in texture. Some women also have hair loss during or after pregnancy, or hair that feels dry or thin. Your hair will most likely return to normal after your baby is born. WHAT TO EXPECT AT YOUR PRENATAL VISITS During a routine prenatal visit:  You will be weighed to make sure you and the fetus are growing normally.  Your blood pressure will be taken.  Your abdomen will be measured to track your baby's growth.  The fetal heartbeat will be listened to.  Any test results from the previous visit will be discussed. Your health care provider may ask you:  How you are feeling.  If you are feeling the baby move.  If you have had any abnormal symptoms, such as leaking fluid, bleeding, severe headaches, or abdominal cramping.  If you have any questions. Other tests that may be performed during your second trimester include:  Blood tests that check for:  Low iron levels (anemia).  Gestational diabetes (between 24 and 28 weeks).  Rh antibodies.  Urine tests to check for infections, diabetes, or protein in the urine.  An ultrasound to confirm the proper growth and development of the baby.  An amniocentesis to check for possible genetic problems.  Fetal screens for spina bifida and Down syndrome. HOME CARE INSTRUCTIONS   Avoid all smoking, herbs, alcohol, and unprescribed   drugs. These chemicals affect the formation and growth of the baby.  Follow your health care provider's instructions regarding medicine use. There are medicines that are either safe or unsafe to take during pregnancy.  Exercise only as directed by your health care provider. Experiencing uterine cramps is a good sign to stop exercising.  Continue to eat regular, healthy meals.  Wear a good support bra for breast tenderness.  Do not use hot tubs, steam rooms, or saunas.  Wear your  seat belt at all times when driving.  Avoid raw meat, uncooked cheese, cat litter boxes, and soil used by cats. These carry germs that can cause birth defects in the baby.  Take your prenatal vitamins.  Try taking a stool softener (if your health care provider approves) if you develop constipation. Eat more high-fiber foods, such as fresh vegetables or fruit and whole grains. Drink plenty of fluids to keep your urine clear or pale yellow.  Take warm sitz baths to soothe any pain or discomfort caused by hemorrhoids. Use hemorrhoid cream if your health care provider approves.  If you develop varicose veins, wear support hose. Elevate your feet for 15 minutes, 3-4 times a day. Limit salt in your diet.  Avoid heavy lifting, wear low heel shoes, and practice good posture.  Rest with your legs elevated if you have leg cramps or low back pain.  Visit your dentist if you have not gone yet during your pregnancy. Use a soft toothbrush to brush your teeth and be gentle when you floss.  A sexual relationship may be continued unless your health care provider directs you otherwise.  Continue to go to all your prenatal visits as directed by your health care provider. SEEK MEDICAL CARE IF:   You have dizziness.  You have mild pelvic cramps, pelvic pressure, or nagging pain in the abdominal area.  You have persistent nausea, vomiting, or diarrhea.  You have a bad smelling vaginal discharge.  You have pain with urination. SEEK IMMEDIATE MEDICAL CARE IF:   You have a fever.  You are leaking fluid from your vagina.  You have spotting or bleeding from your vagina.  You have severe abdominal cramping or pain.  You have rapid weight gain or loss.  You have shortness of breath with chest pain.  You notice sudden or extreme swelling of your face, hands, ankles, feet, or legs.  You have not felt your baby move in over an hour.  You have severe headaches that do not go away with  medicine.  You have vision changes. Document Released: 08/15/2001 Document Revised: 08/26/2013 Document Reviewed: 10/22/2012 [employer] Patient Information 2015 [employer]. This information is not intended to replace advice given to you by your health care provider. Make sure you discuss any questions you have with your health care provider.  

## 2014-03-03 NOTE — Progress Notes (Signed)
Urine culture was neg. Just recently stopped marijuanaUDS . Will repeat.

## 2014-03-25 ENCOUNTER — Encounter: Payer: Self-pay | Admitting: Advanced Practice Midwife

## 2014-03-25 ENCOUNTER — Ambulatory Visit (INDEPENDENT_AMBULATORY_CARE_PROVIDER_SITE_OTHER): Payer: Medicaid Other | Admitting: Advanced Practice Midwife

## 2014-03-25 VITALS — BP 122/81 | HR 94 | Temp 97.7°F | Wt 196.2 lb

## 2014-03-25 DIAGNOSIS — Z3480 Encounter for supervision of other normal pregnancy, unspecified trimester: Secondary | ICD-10-CM

## 2014-03-25 DIAGNOSIS — Z3493 Encounter for supervision of normal pregnancy, unspecified, third trimester: Secondary | ICD-10-CM

## 2014-03-25 LAB — POCT URINALYSIS DIP (DEVICE)
BILIRUBIN URINE: NEGATIVE
GLUCOSE, UA: NEGATIVE mg/dL
HGB URINE DIPSTICK: NEGATIVE
KETONES UR: NEGATIVE mg/dL
Nitrite: NEGATIVE
Protein, ur: 30 mg/dL — AB
SPECIFIC GRAVITY, URINE: 1.02 (ref 1.005–1.030)
Urobilinogen, UA: 1 mg/dL (ref 0.0–1.0)
pH: 6 (ref 5.0–8.0)

## 2014-03-25 NOTE — Progress Notes (Signed)
Reports intermittent pelvic pain/some irregular contractions.

## 2014-03-25 NOTE — Progress Notes (Signed)
Patient is doing well.  She reports occasional "sharp pains on her sides".  She states these occur once or twice when she sleeps.  They never last more than a few minutes.   No vaginal bleeding, no vaginal discharge, +FM, occ belly pains consistent with Braxton Hicks contractions.

## 2014-03-25 NOTE — Patient Instructions (Signed)
It was a pleasure seeing you today!  Information regarding what we discussed is included in this packet.  Please feel free to call our office if any questions or concerns arise.  Third Trimester of Pregnancy The third trimester is from week 29 through week 42, months 7 through 9. This trimester is when your unborn baby (fetus) is growing very fast. At the end of the ninth month, the unborn baby is about 20 inches in length. It weighs about 6-10 pounds.  HOME CARE   Avoid all smoking, herbs, and alcohol. Avoid drugs not approved by your doctor.  Only take medicine as told by your doctor. Some medicines are safe and some are not during pregnancy.  Exercise only as told by your doctor. Stop exercising if you start having cramps.  Eat regular, healthy meals.  Wear a good support bra if your breasts are tender.  Do not use hot tubs, steam rooms, or saunas.  Wear your seat belt when driving.  Avoid raw meat, uncooked cheese, and liter boxes and soil used by cats.  Take your prenatal vitamins.  Try taking medicine that helps you poop (stool softener) as needed, and if your doctor approves. Eat more fiber by eating fresh fruit, vegetables, and whole grains. Drink enough fluids to keep your pee (urine) clear or pale yellow.  Take warm water baths (sitz baths) to soothe pain or discomfort caused by hemorrhoids. Use hemorrhoid cream if your doctor approves.  If you have puffy, bulging veins (varicose veins), wear support hose. Raise (elevate) your feet for 15 minutes, 3-4 times a day. Limit salt in your diet.  Avoid heavy lifting, wear low heels, and sit up straight.  Rest with your legs raised if you have leg cramps or low back pain.  Visit your dentist if you have not gone during your pregnancy. Use a soft toothbrush to brush your teeth. Be gentle when you floss.  You can have sex (intercourse) unless your doctor tells you not to.  Do not travel far distances unless you must. Only do so  with your doctor's approval.  Take prenatal classes.  Practice driving to the hospital.  Pack your hospital bag.  Prepare the baby's room.  Go to your doctor visits. GET HELP IF:  You are not sure if you are in labor or if your water has broken.  You are dizzy.  You have mild cramps or pressure in your lower belly (abdominal).  You have a nagging pain in your belly area.  You continue to feel sick to your stomach (nauseous), throw up (vomit), or have watery poop (diarrhea).  You have bad smelling fluid coming from your vagina.  You have pain with peeing (urination). GET HELP RIGHT AWAY IF:   You have a fever.  You are leaking fluid from your vagina.  You are spotting or bleeding from your vagina.  You have severe belly cramping or pain.  You lose or gain weight rapidly.  You have trouble catching your breath and have chest pain.  You notice sudden or extreme puffiness (swelling) of your face, hands, ankles, feet, or legs.  You have not felt the baby move in over an hour.  You have severe headaches that do not go away with medicine.  You have vision changes. Document Released: 11/15/2009 Document Revised: 12/16/2012 Document Reviewed: 10/22/2012 [employer] Patient Information 2015 [employer]. This information is not intended to replace advice given to you by your health care provider. Make sure you discuss any   you have with your health care provider.

## 2014-03-25 NOTE — Progress Notes (Signed)
Doing well. Reviewed signs to report. Agree with note. Cultures next visit

## 2014-04-06 ENCOUNTER — Other Ambulatory Visit: Payer: Self-pay | Admitting: Obstetrics & Gynecology

## 2014-04-06 ENCOUNTER — Ambulatory Visit (INDEPENDENT_AMBULATORY_CARE_PROVIDER_SITE_OTHER): Payer: Medicaid Other | Admitting: Obstetrics & Gynecology

## 2014-04-06 VITALS — BP 116/63 | HR 99 | Temp 98.3°F | Wt 196.0 lb

## 2014-04-06 DIAGNOSIS — Z3491 Encounter for supervision of normal pregnancy, unspecified, first trimester: Secondary | ICD-10-CM

## 2014-04-06 DIAGNOSIS — Z3483 Encounter for supervision of other normal pregnancy, third trimester: Secondary | ICD-10-CM

## 2014-04-06 DIAGNOSIS — Z3480 Encounter for supervision of other normal pregnancy, unspecified trimester: Secondary | ICD-10-CM

## 2014-04-06 DIAGNOSIS — O368190 Decreased fetal movements, unspecified trimester, not applicable or unspecified: Secondary | ICD-10-CM

## 2014-04-06 LAB — OB RESULTS CONSOLE GC/CHLAMYDIA
Chlamydia: NEGATIVE
Gonorrhea: NEGATIVE

## 2014-04-06 LAB — POCT URINALYSIS DIP (DEVICE)
Bilirubin Urine: NEGATIVE
GLUCOSE, UA: NEGATIVE mg/dL
KETONES UR: NEGATIVE mg/dL
Nitrite: NEGATIVE
Protein, ur: NEGATIVE mg/dL
SPECIFIC GRAVITY, URINE: 1.025 (ref 1.005–1.030)
Urobilinogen, UA: 1 mg/dL (ref 0.0–1.0)
pH: 5.5 (ref 5.0–8.0)

## 2014-04-06 LAB — OB RESULTS CONSOLE GBS: GBS: POSITIVE

## 2014-04-06 NOTE — Patient Instructions (Signed)

## 2014-04-06 NOTE — Progress Notes (Signed)
Pt felt good FM during NST - discussed normal patterns of FM for this EGA.  Pt advised to go to [facility] if FM decreases from current pattern. She voiced understanding.

## 2014-04-06 NOTE — Progress Notes (Signed)
Doing well and no UC, ROM, only not sleeping well. Decreased movement, NST ordered.

## 2014-04-06 NOTE — Progress Notes (Signed)
States feels baby moving off and on throught out the day, but not as much as before.

## 2014-04-07 LAB — GC/CHLAMYDIA PROBE AMP
CT Probe RNA: NEGATIVE
GC PROBE AMP APTIMA: NEGATIVE

## 2014-04-07 LAB — CULTURE, BETA STREP (GROUP B ONLY)

## 2014-04-14 ENCOUNTER — Ambulatory Visit (INDEPENDENT_AMBULATORY_CARE_PROVIDER_SITE_OTHER): Payer: Medicaid Other | Admitting: Obstetrics and Gynecology

## 2014-04-14 ENCOUNTER — Encounter: Payer: Self-pay | Admitting: Obstetrics and Gynecology

## 2014-04-14 VITALS — BP 125/63 | HR 89 | Temp 98.0°F | Wt 195.2 lb

## 2014-04-14 DIAGNOSIS — Z3491 Encounter for supervision of normal pregnancy, unspecified, first trimester: Secondary | ICD-10-CM

## 2014-04-14 DIAGNOSIS — E663 Overweight: Secondary | ICD-10-CM

## 2014-04-14 DIAGNOSIS — Z3480 Encounter for supervision of other normal pregnancy, unspecified trimester: Secondary | ICD-10-CM

## 2014-04-14 LAB — POCT URINALYSIS DIP (DEVICE)
Bilirubin Urine: NEGATIVE
GLUCOSE, UA: NEGATIVE mg/dL
Hgb urine dipstick: NEGATIVE
KETONES UR: NEGATIVE mg/dL
LEUKOCYTES UA: NEGATIVE
Nitrite: NEGATIVE
Protein, ur: NEGATIVE mg/dL
SPECIFIC GRAVITY, URINE: 1.01 (ref 1.005–1.030)
Urobilinogen, UA: 0.2 mg/dL (ref 0.0–1.0)
pH: 7 (ref 5.0–8.0)

## 2014-04-14 NOTE — Progress Notes (Signed)
Patient reports lower back pressure and pain that feels like contractions

## 2014-04-14 NOTE — Progress Notes (Signed)
Patient is doing well without complaints. FM/labor precautions reviewed 

## 2014-04-23 ENCOUNTER — Encounter (HOSPITAL_COMMUNITY): Payer: Self-pay | Admitting: *Deleted

## 2014-04-23 ENCOUNTER — Inpatient Hospital Stay (HOSPITAL_COMMUNITY)
Admission: AD | Admit: 2014-04-23 | Discharge: 2014-04-23 | Disposition: A | Discharge status: Home / Self Care | Payer: Medicaid Other | Source: Ambulatory Visit | Attending: Family Medicine | Admitting: Family Medicine

## 2014-04-23 DIAGNOSIS — O26893 Other specified pregnancy related conditions, third trimester: Secondary | ICD-10-CM

## 2014-04-23 DIAGNOSIS — M545 Low back pain, unspecified: Secondary | ICD-10-CM | POA: Diagnosis not present

## 2014-04-23 DIAGNOSIS — Z87891 Personal history of nicotine dependence: Secondary | ICD-10-CM | POA: Insufficient documentation

## 2014-04-23 DIAGNOSIS — R109 Unspecified abdominal pain: Secondary | ICD-10-CM | POA: Insufficient documentation

## 2014-04-23 DIAGNOSIS — IMO0001 Reserved for inherently not codable concepts without codable children: Secondary | ICD-10-CM | POA: Insufficient documentation

## 2014-04-23 DIAGNOSIS — O9989 Other specified diseases and conditions complicating pregnancy, childbirth and the puerperium: Principal | ICD-10-CM

## 2014-04-23 DIAGNOSIS — O26899 Other specified pregnancy related conditions, unspecified trimester: Secondary | ICD-10-CM

## 2014-04-23 DIAGNOSIS — O99891 Other specified diseases and conditions complicating pregnancy: Secondary | ICD-10-CM | POA: Insufficient documentation

## 2014-04-23 MED ORDER — CYCLOBENZAPRINE HCL 5 MG PO TABS: 5.0000 mg | ORAL_TABLET | Freq: Three times a day (TID) | ORAL | Status: DC | PRN

## 2014-04-23 NOTE — Discharge Instructions (Signed)
Abdominal Pain During Pregnancy Abdominal pain is common in pregnancy. Most of the time, it does not cause harm. There are many causes of abdominal pain. Some causes are more serious than others. Some of the causes of abdominal pain in pregnancy are easily diagnosed. Occasionally, the diagnosis takes time to understand. Other times, the cause is not determined. Abdominal pain can be a sign that something is very wrong with the pregnancy, or the pain may have nothing to do with the pregnancy at all. For this reason, always tell your health care provider if you have any abdominal discomfort. HOME CARE INSTRUCTIONS  Monitor your abdominal pain for any changes. The following actions may help to alleviate any discomfort you are experiencing:  Do not have sexual intercourse or put anything in your vagina until your symptoms go away completely.  Get plenty of rest until your pain improves.  Drink clear fluids if you feel nauseous. Avoid solid food as long as you are uncomfortable or nauseous.  Only take over-the-counter or prescription medicine as directed by your health care provider.  Keep all follow-up appointments with your health care provider. SEEK IMMEDIATE MEDICAL CARE IF:  You are bleeding, leaking fluid, or passing tissue from the vagina.  You have increasing pain or cramping.  You have persistent vomiting.  You have painful or bloody urination.  You have a fever.  You notice a decrease in your baby's movements.  You have extreme weakness or feel faint.  You have shortness of breath, with or without abdominal pain.  You develop a severe headache with abdominal pain.  You have abnormal vaginal discharge with abdominal pain.  You have persistent diarrhea.  You have abdominal pain that continues even after rest, or gets worse. MAKE SURE YOU:   Understand these instructions.  Will watch your condition.  Will get help right away if you are not doing well or get  worse. Document Released: 08/21/2005 Document Revised: 06/11/2013 Document Reviewed: 03/20/2013 [employer] Patient Information 2015 [employer]. This information is not intended to replace advice given to you by your health care provider. Make sure you discuss any questions you have with your health care provider. Back Exercises Back exercises help treat and prevent back injuries. The goal of back exercises is to increase the strength of your abdominal and back muscles and the flexibility of your back. These exercises should be started when you no longer have back pain. Back exercises include:  Pelvic Tilt. Lie on your back with your knees bent. Tilt your pelvis until the lower part of your back is against the floor. Hold this position 5 to 10 sec and repeat 5 to 10 times.  Knee to Chest. Pull first 1 knee up against your chest and hold for 20 to 30 seconds, repeat this with the other knee, and then both knees. This may be done with the other leg straight or bent, whichever feels better.  Sit-Ups or Curl-Ups. Bend your knees 90 degrees. Start with tilting your pelvis, and do a partial, slow sit-up, lifting your trunk only 30 to 45 degrees off the floor. Take at least 2 to 3 seconds for each sit-up. Do not do sit-ups with your knees out straight. If partial sit-ups are difficult, simply do the above but with only tightening your abdominal muscles and holding it as directed.  Hip-Lift. Lie on your back with your knees flexed 90 degrees. Push down with your feet and shoulders as you raise your hips a couple inches off the   floor; hold for 10 seconds, repeat 5 to 10 times.  Back arches. Lie on your stomach, propping yourself up on bent elbows. Slowly press on your hands, causing an arch in your low back. Repeat 3 to 5 times. Any initial stiffness and discomfort should lessen with repetition over time.  Shoulder-Lifts. Lie face down with arms beside your body. Keep hips and torso pressed to floor as you  slowly lift your head and shoulders off the floor. Do not overdo your exercises, especially in the beginning. Exercises may cause you some mild back discomfort which lasts for a few minutes; however, if the pain is more severe, or lasts for more than 15 minutes, do not continue exercises until you see your caregiver. Improvement with exercise therapy for back problems is slow.  See your caregivers for assistance with developing a proper back exercise program. Document Released: 09/28/2004 Document Revised: 11/13/2011 Document Reviewed: 06/22/2011 [employer] Patient Information 2015 [employer]. This information is not intended to replace advice given to you by your health care provider. Make sure you discuss any questions you have with your health care provider.  

## 2014-04-23 NOTE — [facility] Note (Signed)
Urine in lab 

## 2014-04-23 NOTE — MAU Provider Note (Signed)
None    Chief Complaint:  Abdominal Pain and Back Pain  Carla Rollins is  18 y.o. G1P0000 at [redacted]w[redacted]d presents complaining of Abdominal Pain and Back Pain for the last 3 days. She notes that she "woke up wrong" 3 days ago and noted pain below her right breast. She feels like movement makes it worse and rest makes it better. She denies any pelvic pain or dysuria. No increased urinary frequency. No vaginal discharge. No increased BPs, no increased facial/hand swelling, no HAs, or dizziness.  Additionally, she's noted lower back pain for the last few weeks. No numbness/tingling of the LE, no weakness. No injuries or falls. 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 No fever/chills.  No LOF, vaginal bleeding, or contractions. Good fetal movement.    Obstetrical/Gynecological History: OB History   Grav Para Term Preterm Abortions TAB SAB Ect Mult Living   1 0 0 0 0 0 0 0 0 0      Past Medical History: Past Medical History  Diagnosis Date  . Asthma     Past Surgical History: Past Surgical History  Procedure Laterality Date  . No past surgeries      Family History: Family History  Problem Relation Age of Onset  . Diabetes Maternal Grandmother   . Cancer Maternal Grandfather     Social History: History  Substance Use Topics  . Smoking status: Former Smoker -- 1 years    Types: Cigarettes  . Smokeless tobacco: Never Used  . Alcohol Use: No     Comment: not during pregnancy    Allergies: No Known Allergies  Meds:  Prescriptions prior to admission  Medication Sig Dispense Refill  . acetaminophen (TYLENOL) 325 MG tablet Take 650 mg by mouth every 6 (six) hours as needed for moderate pain.      . Prenatal Vit-Fe Fumarate-FA (PRENATAL MULTIVITAMIN) TABS tablet Take 1 tablet by mouth daily at 12 noon.        Review of Systems -   Review of Systems  Constitutional: Negative for fever, chills, weight  loss, malaise/fatigue and diaphoresis.  HENT: Negative for hearing loss, ear pain, nosebleeds, congestion, sore throat, neck pain, tinnitus and ear discharge.   Eyes: Negative for blurred vision, double vision, photophobia, pain, discharge and redness.  Respiratory: Negative for cough, hemoptysis, sputum production, shortness of breath, wheezing and stridor.   Cardiovascular: Negative for chest pain, palpitations, orthopnea,  leg swelling  Gastrointestinal: Negative for abdominal pain heartburn, nausea, vomiting, diarrhea, constipation, blood in stool Genitourinary: Negative for dysuria, urgency, frequency, hematuria and flank pain.  Musculoskeletal: Negative for myalgias, joint pain and falls.  Skin: Negative for itching and rash.  Neurological: Negative for dizziness, tingling, tremors, sensory change, speech change, focal weakness, seizures, loss of consciousness, weakness and headaches.  Endo/Heme/Allergies: Negative for environmental allergies and polydipsia. Does not bruise/bleed easily.  Psychiatric/Behavioral: Negative for depression, suicidal ideas, hallucinations, memory loss and substance abuse. The patient is not nervous/anxious and does not have insomnia.      Physical Exam  Blood pressure 128/59, pulse 85, temperature 99 F (37.2 C), temperature source Oral, resp. rate 18, height 5' 4.5" (1.638 m), weight 90.266 kg (199 lb), last menstrual period 07/05/2013. GENERAL: Well-developed, well-nourished female in no acute distress.  LUNGS: Clear to auscultation bilaterally.  HEART: Regular rate and rhythm. ABDOMEN: Soft, nondistended, some tenderness below the right breast. gravid. No rebound/guarding. No CVA tenderness  SPINE: No drop-offs. No tenderness over the spinal processes. Tender in paraspinal muscles, L>R EXTREMITIES: Nontender, no edema, 2+ distal pulses. DTR's 2+ CERVICAL EXAM: Dilatation 1cm   Effacement thick   Station -3   Presentation: Unknown FHT:  Baseline rate  140 bpm   Variability moderate  Accelerations present   Decelerations none Contractions: Irregular    Labs: Last U/A 8/11 neg for LE, nitrites, or protein  Imaging Studies:  No results found.  Assessment: Carla Rollins is  18 y.o. G1P0000 at 5679w3d presents with lumbar paraspinal muscle tenderness and pain under her right breast, both of which seem musculoskeletal in nature. No urinary symptoms concerning for UTI/pyelo. History and PE unconcerning for labor   Plan: - Flexeril 5mg  for MSK pain  - Discussed possibility of sitting at work to rest the back. - Labor precautions and kick counts discussed.  Joanna PuffDorsey, Crystal S 8/20/20156:02 PM  Evaluation and management procedures were performed by Resident physician under my supervision/collaboration. Chart reviewed, patient examined by me and I agree with management and plan.

## 2014-04-23 NOTE — MAU Note (Signed)
RUQ pain past 3 days.  Low back started hurting today.  Denies any problems with preg.

## 2014-04-30 ENCOUNTER — Ambulatory Visit (INDEPENDENT_AMBULATORY_CARE_PROVIDER_SITE_OTHER): Payer: Medicaid Other | Admitting: Family

## 2014-04-30 VITALS — BP 129/58 | HR 98 | Temp 97.9°F | Wt 197.5 lb

## 2014-04-30 DIAGNOSIS — Z3480 Encounter for supervision of other normal pregnancy, unspecified trimester: Secondary | ICD-10-CM

## 2014-04-30 DIAGNOSIS — O480 Post-term pregnancy: Secondary | ICD-10-CM

## 2014-04-30 DIAGNOSIS — Z3491 Encounter for supervision of normal pregnancy, unspecified, first trimester: Secondary | ICD-10-CM

## 2014-04-30 LAB — POCT URINALYSIS DIP (DEVICE)
GLUCOSE, UA: NEGATIVE mg/dL
HGB URINE DIPSTICK: NEGATIVE
NITRITE: NEGATIVE
Protein, ur: 30 mg/dL — AB
SPECIFIC GRAVITY, URINE: 1.015 (ref 1.005–1.030)
Urobilinogen, UA: 1 mg/dL (ref 0.0–1.0)
pH: 6.5 (ref 5.0–8.0)

## 2014-04-30 LAB — US OB FOLLOW UP

## 2014-04-30 NOTE — Progress Notes (Signed)
C/o of pelvic pressure and lower back discomfort with irregular contractions.

## 2014-04-30 NOTE — Progress Notes (Signed)
Received order from [facility] to schedule 41w induction; IOL scheduled for 8/31

## 2014-04-30 NOTE — Progress Notes (Signed)
Reports intermittent contractions.  Good fetal movement, mainly at night.  NST-R

## 2014-05-03 ENCOUNTER — Inpatient Hospital Stay (HOSPITAL_COMMUNITY)
Admission: AD | Admit: 2014-05-03 | Discharge: 2014-05-07 | DRG: 775 | Disposition: A | Discharge status: Home / Self Care | Payer: Medicaid Other | Source: Ambulatory Visit | Attending: Obstetrics & Gynecology | Admitting: Obstetrics & Gynecology

## 2014-05-03 ENCOUNTER — Encounter (HOSPITAL_COMMUNITY): Payer: Self-pay | Admitting: *Deleted

## 2014-05-03 DIAGNOSIS — F1210 Cannabis abuse, uncomplicated: Secondary | ICD-10-CM | POA: Diagnosis present

## 2014-05-03 DIAGNOSIS — D649 Anemia, unspecified: Secondary | ICD-10-CM | POA: Diagnosis present

## 2014-05-03 DIAGNOSIS — O9902 Anemia complicating childbirth: Secondary | ICD-10-CM | POA: Diagnosis present

## 2014-05-03 DIAGNOSIS — O99344 Other mental disorders complicating childbirth: Secondary | ICD-10-CM | POA: Diagnosis present

## 2014-05-03 DIAGNOSIS — Z87891 Personal history of nicotine dependence: Secondary | ICD-10-CM

## 2014-05-03 DIAGNOSIS — O480 Post-term pregnancy: Principal | ICD-10-CM | POA: Diagnosis present

## 2014-05-03 DIAGNOSIS — Z22330 Carrier of Group B streptococcus: Secondary | ICD-10-CM

## 2014-05-03 DIAGNOSIS — O99892 Other specified diseases and conditions complicating childbirth: Secondary | ICD-10-CM | POA: Diagnosis present

## 2014-05-03 DIAGNOSIS — K219 Gastro-esophageal reflux disease without esophagitis: Secondary | ICD-10-CM | POA: Diagnosis present

## 2014-05-03 DIAGNOSIS — O9989 Other specified diseases and conditions complicating pregnancy, childbirth and the puerperium: Secondary | ICD-10-CM

## 2014-05-03 DIAGNOSIS — Z3491 Encounter for supervision of normal pregnancy, unspecified, first trimester: Secondary | ICD-10-CM

## 2014-05-03 DIAGNOSIS — Z833 Family history of diabetes mellitus: Secondary | ICD-10-CM

## 2014-05-03 HISTORY — DX: Unspecified infectious disease: B99.9

## 2014-05-03 HISTORY — DX: Chlamydial infection, unspecified: A74.9

## 2014-05-03 NOTE — MAU Note (Signed)
Pt reports contractions, denies bleeding or ROM.  

## 2014-05-04 ENCOUNTER — Inpatient Hospital Stay (HOSPITAL_COMMUNITY): Payer: Medicaid Other | Attending: Obstetrics and Gynecology

## 2014-05-04 ENCOUNTER — Encounter (HOSPITAL_COMMUNITY): Payer: Self-pay | Admitting: *Deleted

## 2014-05-04 DIAGNOSIS — O99344 Other mental disorders complicating childbirth: Secondary | ICD-10-CM | POA: Diagnosis present

## 2014-05-04 DIAGNOSIS — F1210 Cannabis abuse, uncomplicated: Secondary | ICD-10-CM | POA: Diagnosis present

## 2014-05-04 DIAGNOSIS — O99892 Other specified diseases and conditions complicating childbirth: Secondary | ICD-10-CM | POA: Diagnosis present

## 2014-05-04 DIAGNOSIS — O480 Post-term pregnancy: Secondary | ICD-10-CM | POA: Diagnosis present

## 2014-05-04 DIAGNOSIS — Z22330 Carrier of Group B streptococcus: Secondary | ICD-10-CM | POA: Diagnosis not present

## 2014-05-04 DIAGNOSIS — O9902 Anemia complicating childbirth: Secondary | ICD-10-CM | POA: Diagnosis present

## 2014-05-04 DIAGNOSIS — K219 Gastro-esophageal reflux disease without esophagitis: Secondary | ICD-10-CM | POA: Diagnosis present

## 2014-05-04 DIAGNOSIS — Z87891 Personal history of nicotine dependence: Secondary | ICD-10-CM | POA: Diagnosis not present

## 2014-05-04 DIAGNOSIS — Z833 Family history of diabetes mellitus: Secondary | ICD-10-CM | POA: Diagnosis not present

## 2014-05-04 DIAGNOSIS — O479 False labor, unspecified: Secondary | ICD-10-CM | POA: Diagnosis present

## 2014-05-04 DIAGNOSIS — E663 Overweight: Secondary | ICD-10-CM | POA: Diagnosis not present

## 2014-05-04 DIAGNOSIS — D649 Anemia, unspecified: Secondary | ICD-10-CM | POA: Diagnosis present

## 2014-05-04 LAB — CBC
HEMATOCRIT: 30.6 % — AB (ref 36.0–46.0)
HEMOGLOBIN: 10.1 g/dL — AB (ref 12.0–15.0)
MCH: 26.7 pg (ref 26.0–34.0)
MCHC: 33 g/dL (ref 30.0–36.0)
MCV: 81 fL (ref 78.0–100.0)
Platelets: 200 10*3/uL (ref 150–400)
RBC: 3.78 MIL/uL — ABNORMAL LOW (ref 3.87–5.11)
RDW: 14.7 % (ref 11.5–15.5)
WBC: 11.2 10*3/uL — ABNORMAL HIGH (ref 4.0–10.5)

## 2014-05-04 LAB — RAPID URINE DRUG SCREEN, HOSP PERFORMED
Amphetamines: NOT DETECTED
BARBITURATES: NOT DETECTED
BENZODIAZEPINES: NOT DETECTED
COCAINE: NOT DETECTED
OPIATES: NOT DETECTED
Tetrahydrocannabinol: NOT DETECTED

## 2014-05-04 LAB — SAMPLE TO BLOOD BANK

## 2014-05-04 LAB — RPR

## 2014-05-04 MED ORDER — LACTATED RINGERS IV SOLN: 500.0000 mL | INTRAVENOUS | Status: DC | PRN

## 2014-05-04 MED ORDER — LIDOCAINE HCL (PF) 1 % IJ SOLN
30.0000 mL | INTRAMUSCULAR | Status: DC | PRN
  Filled 2014-05-04: qty 30

## 2014-05-04 MED ORDER — TERBUTALINE SULFATE 1 MG/ML IJ SOLN: 0.2500 mg | Freq: Once | INTRAMUSCULAR | Status: AC | PRN

## 2014-05-04 MED ORDER — PENICILLIN G POTASSIUM 5000000 UNITS IJ SOLR
5.0000 10*6.[IU] | Freq: Once | INTRAMUSCULAR | Status: AC
  Administered 2014-05-04: 5 10*6.[IU] via INTRAVENOUS
  Filled 2014-05-04: qty 5

## 2014-05-04 MED ORDER — CITRIC ACID-SODIUM CITRATE 334-500 MG/5ML PO SOLN: 30.0000 mL | ORAL | Status: DC | PRN

## 2014-05-04 MED ORDER — OXYTOCIN 40 UNITS IN LACTATED RINGERS INFUSION - SIMPLE MED: 62.5000 mL/h | INTRAVENOUS | Status: DC

## 2014-05-04 MED ORDER — ONDANSETRON HCL 4 MG/2ML IJ SOLN
4.0000 mg | Freq: Four times a day (QID) | INTRAMUSCULAR | Status: DC | PRN
  Administered 2014-05-05: 4 mg via INTRAVENOUS
  Filled 2014-05-04: qty 2

## 2014-05-04 MED ORDER — FENTANYL CITRATE 0.05 MG/ML IJ SOLN
100.0000 ug | INTRAMUSCULAR | Status: DC | PRN
  Administered 2014-05-04 – 2014-05-05 (×3): 100 ug via INTRAVENOUS
  Filled 2014-05-04 (×4): qty 2

## 2014-05-04 MED ORDER — PENICILLIN G POTASSIUM 5000000 UNITS IJ SOLR
2.5000 10*6.[IU] | INTRAVENOUS | Status: DC
  Administered 2014-05-05 (×4): 2.5 10*6.[IU] via INTRAVENOUS
  Filled 2014-05-04 (×8): qty 2.5

## 2014-05-04 MED ORDER — OXYCODONE-ACETAMINOPHEN 5-325 MG PO TABS: 1.0000 | ORAL_TABLET | ORAL | Status: DC | PRN

## 2014-05-04 MED ORDER — OXYTOCIN 40 UNITS IN LACTATED RINGERS INFUSION - SIMPLE MED
1.0000 m[IU]/min | INTRAVENOUS | Status: DC
  Administered 2014-05-04: 2 m[IU]/min via INTRAVENOUS
  Administered 2014-05-05: 4 m[IU]/min via INTRAVENOUS
  Administered 2014-05-05: 6 m[IU]/min via INTRAVENOUS
  Filled 2014-05-04: qty 1000

## 2014-05-04 MED ORDER — IBUPROFEN 600 MG PO TABS: 600.0000 mg | ORAL_TABLET | Freq: Four times a day (QID) | ORAL | Status: DC | PRN

## 2014-05-04 MED ORDER — ACETAMINOPHEN 325 MG PO TABS
650.0000 mg | ORAL_TABLET | ORAL | Status: DC | PRN
Start: 2014-05-04 — End: 2014-05-05

## 2014-05-04 MED ORDER — OXYTOCIN BOLUS FROM INFUSION: 500.0000 mL | INTRAVENOUS | Status: DC

## 2014-05-04 MED ORDER — MISOPROSTOL 200 MCG PO TABS
50.0000 ug | ORAL_TABLET | ORAL | Status: DC | PRN
  Administered 2014-05-04 (×3): 50 ug via ORAL
  Filled 2014-05-04 (×3): qty 0.5

## 2014-05-04 MED ORDER — LACTATED RINGERS IV SOLN
INTRAVENOUS | Status: DC
  Administered 2014-05-04 – 2014-05-05 (×4): via INTRAVENOUS

## 2014-05-04 NOTE — Progress Notes (Addendum)
LABOR PROGRESS NOTE  Carla Rollins is a 18 y.o. G1P0000 at [redacted]w[redacted]d  admitted for induction of labor due to Post dates. Due date 05/03/2014.  Subjective: Contractions becoming more painful  Objective: BP 113/82  Pulse 77  Temp(Src) 98.6 F (37 C) (Oral)  Resp 20  Ht 5' 4.5" (1.638 m)  Wt 201 lb (91.173 kg)  BMI 33.98 kg/m2  SpO2 100%  LMP 07/05/2013 or  Filed Vitals:   05/04/14 0730 05/04/14 0953 05/04/14 1117 05/04/14 1423  BP:   115/75 113/82  Pulse:   91 77  Temp:    98.6 F (37 C)  TempSrc:    Oral  Resp: Height:      Weight:      SpO2:           FHT:  FHR: 130 bpm, variability: moderate,  accelerations:  Present,  decelerations:  Absent UC:   irregular, every 3-5 minutes SVE:   Dilation: 1.5 Effacement (%): 60 Station: -2 Exam by:: dr Ane Payment  Dilation: 1.5 Effacement (%): 60 Cervical Position: Posterior Station: -2 Presentation: Vertex Exam by:: dr Ane Payment   Labs: Lab Results  Component Value Date   WBC 11.2* 05/04/2014   HGB 10.1* 05/04/2014   HCT 30.6* 05/04/2014   MCV 81.0 05/04/2014   PLT 200 05/04/2014    Assessment / Plan: Induction of labor due to postterm  Labor: Status post cytotec x 2. SVE 1-2/60/-2. FB placed.  Fetal  you and and and andWellbeing:  Category I Pain Control:  Desires epidural once more uncomfortable.  Anticipated MOD:  NSVD  William Dalton, MD 05/04/2014, 4:51 PM

## 2014-05-04 NOTE — H&P (Signed)
Carla Rollins is a 18 y.o. female G1P0000 with IUP at [redacted]w[redacted]d by 8 wk [place] presenting due to reported irregular contractions.  Pt scheduled for IOL at 0700 for postdates.  She notes some ctx and good FM, and denies LOF or VB.    [facility] at [facility] since 11 wks  Prenatal History/Complications: THC use  Past Medical History: Past Medical History  Diagnosis Date  . Asthma   . Infection   . Chlamydia 2013    Past Surgical History: Past Surgical History  Procedure Laterality Date  . No past surgeries      Obstetrical History: OB History   Grav Para Term Preterm Abortions TAB SAB Ect Mult Living        Social History: History   Social History  . Marital Status: Single    Spouse Name: N/A    Number of Children: N/A  . Years of Education: N/A   Social History Main Topics  . Smoking status: Former Smoker -- 1 years    Types: Cigarettes  . Smokeless tobacco: Never Used  . Alcohol Use: No     Comment: not during pregnancy  . Drug Use: No  . Sexual Activity: Yes    Birth Control/ Protection: None     Comment: preg   Other Topics Concern  . None   Social History Narrative  . None    Family History: Family History  Problem Relation Age of Onset  . Diabetes Maternal Grandmother   . Cancer Maternal Grandfather     Allergies: No Known Allergies  Prescriptions prior to admission  Medication Sig Dispense Refill  . acetaminophen (TYLENOL) 325 MG tablet Take 650 mg by mouth every 6 (six) hours as needed for moderate pain.      . cyclobenzaprine (FLEXERIL) 5 MG tablet Take 1 tablet (5 mg total) by mouth 3 (three) times daily as needed for muscle spasms.  15 tablet  0  . Prenatal Vit-Fe Fumarate-FA (PRENATAL MULTIVITAMIN) TABS tablet Take 1 tablet by mouth daily at 12 noon.        Review of Systems: Negative unless otherwise stated in History above  Physicial Blood pressure 134/73, pulse 87, temperature 98.7 F (37.1 C), temperature source Oral, resp.  rate 18, height 5' 4.5" (1.638 m), weight 91.173 kg (201 lb), last menstrual period 07/05/2013, SpO2 100.00%. General appearance: alert, cooperative and no distress Lungs: clear to auscultation bilaterally Heart: regular rate and rhythm Abdomen: soft, non-tender; bowel sounds normal Extremities: Homans sign is negative, no sign of DVT Presentation: cephalic Fetal monitoringBaseline: 135 bpm, Variability: Good {> 6 bpm), Accelerations: Reactive and Decelerations: Absent Uterine activityIntensity:  Irregular  Dilation: 1 Effacement (%): 50  Prenatal labs: ABO, Rh: A/POS/-- (02/05 1101) Antibody: NEG (02/05 1101) Rubella:   RPR: NON REAC (06/03 1215)  HBsAg: NEGATIVE (02/05 1101)  HIV: NONREACTIVE (06/03 1215)  GBS:    GTT: wnl  Prenatal Transfer Tool  Maternal Diabetes: No Genetic Screening: Declined Maternal Ultrasounds/Referrals: Normal Fetal Ultrasounds or other Referrals:  None Maternal Substance Abuse:  Yes:  Type: Marijuana Significant Maternal Medications:  None Significant Maternal Lab Results: Lab values include: Group B Strep positive  No results found for this or any previous visit (from the past 24 hour(s)).  Assessment: Carla Rollins is a 18 y.o. G1P0000 at [redacted]w[redacted]d by here for IOL for PD by 8 wk [place].   #Labor: Admit to birthing suite for induction; Start PO Cytotec, consider  FB  #Pain: Labor support; Pt desires epidural when needed #FWB: Cat 1 #ID:  GBS +; Will Start PCN when in active labor #Feeding: Bottle #MOC:Nexplanon #Circ:  Yes - as out patient  Wenda Low MD [facility] PGY-2 05/04/2014, 12:29 AM  I examined pt and agree with documentation above and resident plan of care. Eino Farber Kennith Gain, CNM

## 2014-05-04 NOTE — Progress Notes (Signed)
Carla Rollins is a 18 y.o. G1P0000 at [redacted]w[redacted]d by8 wk [place] presenting for IOL due to PD.   Subjective: Pt sleeping; Doing well  Objective: BP 126/69  Pulse 86  Temp(Src) 98.6 F (37 C) (Oral)  Resp 18  Ht 5' 4.5" (1.638 m)  Wt 91.173 kg (201 lb)  BMI 33.98 kg/m2  SpO2 100%  LMP 07/05/2013     FHT:  FHR: 135 bpm, variability: moderate,  accelerations:  Present,  decelerations:  Absent UC:   regular, every 4-5 minutes SVE:   Dilation: 1.5 Effacement (%): 50 Station: -2 Exam by:: Melvern Banker, RN  Labs: Lab Results  Component Value Date   WBC 11.2* 05/04/2014   HGB 10.1* 05/04/2014   HCT 30.6* 05/04/2014   MCV 81.0 05/04/2014   PLT 200 05/04/2014   Assessment / Plan: IOL for PD  Labor: Progressing normally and Cytotec PO x 2 Preeclampsia:  no signs or symptoms of toxicity Fetal Wellbeing:  Category I Pain Control:  Labor support without medications I/D:  GBS +; Will start PCN when in active labor Anticipated MOD:  NSVD  Wenda Low 05/04/2014, 6:44 AM

## 2014-05-04 NOTE — Progress Notes (Signed)
LABOR PROGRESS NOTE  Carla Rollins is a 18 y.o. G1P0000 at [redacted]w[redacted]d  admitted for induction of labor due to Post dates. Due date 04/27/2014.  Subjective: Painful contractions.   Objective: BP 128/79  Pulse 86  Temp(Src) 98.6 F (37 C) (Oral)  Resp 18  Ht 5' 4.5" (1.638 m)  Wt 201 lb (91.173 kg)  BMI 33.98 kg/m2  SpO2 100%  LMP 07/05/2013 or  Filed Vitals:   05/04/14 1117 05/04/14 1423 05/04/14 1837 05/04/14 1923  BP: 115/75 113/82 127/77 128/79  Pulse: 91 77 74 86  Temp:  98.6 F (37 C) 98.4 F (36.9 C) 98.6 F (37 C)  TempSrc:  Oral Oral Oral  Resp:  Height:      Weight:      SpO2:           FHT:  FHR: 130 bpm, variability: moderate,  accelerations:  Present,  decelerations:  Absent UC:   irregular, every 5-7 minutes SVE:   Dilation: 4 Effacement (%): 60 Station: -3 Exam by:: Dr. Maurice Small  Dilation: 4 Effacement (%): 60 Cervical Position: Posterior Station: -3 Presentation: Vertex Exam by:: Dr. Maurice Small   Labs: Lab Results  Component Value Date   WBC 11.2* 05/04/2014   HGB 10.1* 05/04/2014   HCT 30.6* 05/04/2014   MCV 81.0 05/04/2014   PLT 200 05/04/2014    Assessment / Plan: Induction of labor due to postterm.   Labor: Cytotec x 2. Foley bulb out at 2300. Start pitocin and increase per protocol.  GBS Positive: Start PCN with pitocin.  Fetal Wellbeing:  Category I Pain Control:  Epidural Anticipated MOD:  NSVD  William Dalton, MD 05/04/2014, 11:05 PM

## 2014-05-05 ENCOUNTER — Encounter (HOSPITAL_COMMUNITY): Payer: Medicaid Other | Admitting: Anesthesiology

## 2014-05-05 ENCOUNTER — Inpatient Hospital Stay (HOSPITAL_COMMUNITY): Payer: Medicaid Other | Admitting: Anesthesiology

## 2014-05-05 ENCOUNTER — Encounter (HOSPITAL_COMMUNITY): Payer: Self-pay | Admitting: Anesthesiology

## 2014-05-05 DIAGNOSIS — F1210 Cannabis abuse, uncomplicated: Secondary | ICD-10-CM

## 2014-05-05 DIAGNOSIS — O480 Post-term pregnancy: Secondary | ICD-10-CM

## 2014-05-05 DIAGNOSIS — E663 Overweight: Secondary | ICD-10-CM

## 2014-05-05 LAB — CBC
HEMATOCRIT: 29.2 % — AB (ref 36.0–46.0)
Hemoglobin: 9.3 g/dL — ABNORMAL LOW (ref 12.0–15.0)
MCH: 25.7 pg — ABNORMAL LOW (ref 26.0–34.0)
MCHC: 31.8 g/dL (ref 30.0–36.0)
MCV: 80.7 fL (ref 78.0–100.0)
PLATELETS: 200 10*3/uL (ref 150–400)
RBC: 3.62 MIL/uL — ABNORMAL LOW (ref 3.87–5.11)
RDW: 14.7 % (ref 11.5–15.5)
WBC: 13 10*3/uL — AB (ref 4.0–10.5)

## 2014-05-05 MED ORDER — FENTANYL 2.5 MCG/ML BUPIVACAINE 1/10 % EPIDURAL INFUSION (WH - ANES)
14.0000 mL/h | INTRAMUSCULAR | Status: DC | PRN
  Administered 2014-05-05: 14 mL/h via EPIDURAL
  Filled 2014-05-05: qty 125

## 2014-05-05 MED ORDER — LIDOCAINE HCL (PF) 1 % IJ SOLN
INTRAMUSCULAR | Status: DC | PRN
  Administered 2014-05-05 (×2): 4 mL

## 2014-05-05 MED ORDER — MISOPROSTOL 200 MCG PO TABS
ORAL_TABLET | ORAL | Status: AC
  Administered 2014-05-05: 800 ug via RECTAL
  Filled 2014-05-05: qty 4

## 2014-05-05 MED ORDER — OXYCODONE-ACETAMINOPHEN 5-325 MG PO TABS
1.0000 | ORAL_TABLET | ORAL | Status: DC | PRN
  Administered 2014-05-05 – 2014-05-06 (×5): 1 via ORAL
  Filled 2014-05-05 (×6): qty 1

## 2014-05-05 MED ORDER — LACTATED RINGERS IV SOLN: 500.0000 mL | Freq: Once | INTRAVENOUS | Status: DC

## 2014-05-05 MED ORDER — EPHEDRINE 5 MG/ML INJ
10.0000 mg | INTRAVENOUS | Status: DC | PRN
  Filled 2014-05-05: qty 2

## 2014-05-05 MED ORDER — LANOLIN HYDROUS EX OINT: TOPICAL_OINTMENT | CUTANEOUS | Status: DC | PRN

## 2014-05-05 MED ORDER — SIMETHICONE 80 MG PO CHEW: 80.0000 mg | CHEWABLE_TABLET | ORAL | Status: DC | PRN

## 2014-05-05 MED ORDER — DIBUCAINE 1 % RE OINT: 1.0000 "application " | TOPICAL_OINTMENT | RECTAL | Status: DC | PRN

## 2014-05-05 MED ORDER — TETANUS-DIPHTH-ACELL PERTUSSIS 5-2.5-18.5 LF-MCG/0.5 IM SUSP
0.5000 mL | Freq: Once | INTRAMUSCULAR | Status: DC
  Filled 2014-05-05: qty 0.5

## 2014-05-05 MED ORDER — ONDANSETRON HCL 4 MG PO TABS: 4.0000 mg | ORAL_TABLET | ORAL | Status: DC | PRN

## 2014-05-05 MED ORDER — IBUPROFEN 600 MG PO TABS
600.0000 mg | ORAL_TABLET | Freq: Four times a day (QID) | ORAL | Status: DC
  Administered 2014-05-05 – 2014-05-07 (×9): 600 mg via ORAL
  Filled 2014-05-05 (×9): qty 1

## 2014-05-05 MED ORDER — PHENYLEPHRINE 40 MCG/ML (10ML) SYRINGE FOR IV PUSH (FOR BLOOD PRESSURE SUPPORT)
PREFILLED_SYRINGE | INTRAVENOUS | Status: AC
  Filled 2014-05-05: qty 10

## 2014-05-05 MED ORDER — ONDANSETRON HCL 4 MG/2ML IJ SOLN: 4.0000 mg | INTRAMUSCULAR | Status: DC | PRN

## 2014-05-05 MED ORDER — SENNOSIDES-DOCUSATE SODIUM 8.6-50 MG PO TABS
2.0000 | ORAL_TABLET | ORAL | Status: DC
  Administered 2014-05-05 – 2014-05-07 (×2): 2 via ORAL
  Filled 2014-05-05 (×2): qty 2

## 2014-05-05 MED ORDER — PRENATAL MULTIVITAMIN CH
1.0000 | ORAL_TABLET | Freq: Every day | ORAL | Status: DC
  Administered 2014-05-06 – 2014-05-07 (×2): 1 via ORAL
  Filled 2014-05-05 (×2): qty 1

## 2014-05-05 MED ORDER — WITCH HAZEL-GLYCERIN EX PADS: 1.0000 "application " | MEDICATED_PAD | CUTANEOUS | Status: DC | PRN

## 2014-05-05 MED ORDER — BENZOCAINE-MENTHOL 20-0.5 % EX AERO
1.0000 "application " | INHALATION_SPRAY | CUTANEOUS | Status: DC | PRN
  Administered 2014-05-07: 1 via TOPICAL
  Filled 2014-05-05: qty 56

## 2014-05-05 MED ORDER — DIPHENHYDRAMINE HCL 25 MG PO CAPS: 25.0000 mg | ORAL_CAPSULE | Freq: Four times a day (QID) | ORAL | Status: DC | PRN

## 2014-05-05 MED ORDER — OXYCODONE-ACETAMINOPHEN 5-325 MG PO TABS
2.0000 | ORAL_TABLET | ORAL | Status: DC | PRN
  Administered 2014-05-07: 2 via ORAL
  Filled 2014-05-05: qty 2

## 2014-05-05 MED ORDER — FENTANYL 2.5 MCG/ML BUPIVACAINE 1/10 % EPIDURAL INFUSION (WH - ANES)
INTRAMUSCULAR | Status: AC
  Filled 2014-05-05: qty 125

## 2014-05-05 MED ORDER — FENTANYL 2.5 MCG/ML BUPIVACAINE 1/10 % EPIDURAL INFUSION (WH - ANES)
INTRAMUSCULAR | Status: DC | PRN
  Administered 2014-05-05: 14 mL/h via EPIDURAL

## 2014-05-05 MED ORDER — DIPHENHYDRAMINE HCL 50 MG/ML IJ SOLN: 12.5000 mg | INTRAMUSCULAR | Status: DC | PRN

## 2014-05-05 MED ORDER — MISOPROSTOL 200 MCG PO TABS
800.0000 ug | ORAL_TABLET | Freq: Once | ORAL | Status: AC
  Administered 2014-05-05: 800 ug via RECTAL

## 2014-05-05 MED ORDER — LACTATED RINGERS IV SOLN
INTRAVENOUS | Status: DC
  Administered 2014-05-05: 10:00:00 via INTRAUTERINE

## 2014-05-05 MED ORDER — ZOLPIDEM TARTRATE 5 MG PO TABS: 5.0000 mg | ORAL_TABLET | Freq: Every evening | ORAL | Status: DC | PRN

## 2014-05-05 MED ORDER — PHENYLEPHRINE 40 MCG/ML (10ML) SYRINGE FOR IV PUSH (FOR BLOOD PRESSURE SUPPORT)
80.0000 ug | PREFILLED_SYRINGE | INTRAVENOUS | Status: DC | PRN
  Filled 2014-05-05: qty 2

## 2014-05-05 NOTE — Progress Notes (Signed)
LABOR PROGRESS NOTE  Carla Rollins is a 18 y.o. G1P0000 at [redacted]w[redacted]d  admitted for induction of labor due to prolonged pregnancy.  Subjective: Comfortable, feels some contractions even with epidural  Objective: BP 107/59  Pulse 87  Temp(Src) 98.9 F (37.2 C) (Oral)  Resp 18  Ht 5' 4.5" (1.638 m)  Wt 201 lb (91.173 kg)  BMI 33.98 kg/m2  SpO2 99%  LMP 07/05/2013 or  Filed Vitals:   05/05/14 0700 05/05/14 0701 05/05/14 0731 05/05/14 0801  BP: 122/70  100/54 107/59  Pulse: 73 72 84 87  Temp: 98.9 F (37.2 C)     TempSrc: Oral     Resp: Height:      Weight:      SpO2:  99%         FHT:  FHR: 140 bpm, variability: moderate,  accelerations:  Present,  decelerations:  Present questionable lates, few earlies UC:   regular, every 2 minutes SVE:   Dilation: 6 Effacement (%): 90 Station: 0 Exam by:: Camelia Eng, RN  Dilation: 6 Effacement (%): 90 Cervical Position: Posterior Station: 0 Presentation: Vertex Exam by:: Camelia Eng, RN  Pitocin @ 8 mu/min  Labs: Lab Results  Component Value Date   WBC 13.0* 05/05/2014   HGB 9.3* 05/05/2014   HCT 29.2* 05/05/2014   MCV 80.7 05/05/2014   PLT 200 05/05/2014    Assessment / Plan: Induction of labor due to postterm,  progressing well on pitocin  Labor: Progressing normally, AROM this check 0900 clear, IUPC also placed Fetal Wellbeing:  Category II Pain Control:  Epidural Anticipated MOD:  NSVD  Jaileigh Weimer ROCIO, MD 05/05/2014, 9:06 AM

## 2014-05-05 NOTE — Anesthesia Procedure Notes (Signed)
Epidural Patient location during procedure: OB Start time: 05/05/2014 6:19 AM  Staffing Anesthesiologist: Lashea Goda A. Performed by: anesthesiologist   Preanesthetic Checklist Completed: patient identified, site marked, surgical consent, pre-op evaluation, timeout performed, IV checked, risks and benefits discussed and monitors and equipment checked  Epidural Patient position: sitting Prep: site prepped and draped and DuraPrep Patient monitoring: continuous pulse ox and blood pressure Approach: midline Location: L3-L4 Injection technique: LOR air  Needle:  Needle type: Tuohy  Needle gauge: 17 G Needle length: 9 cm and 9 Needle insertion depth: 6 cm Catheter type: closed end flexible Catheter size: 19 Gauge Catheter at skin depth: 11 cm Test dose: negative and Other  Assessment Events: blood not aspirated, injection not painful, no injection resistance, negative IV test and no paresthesia  Additional Notes Patient identified. Risks and benefits discussed including failed block, incomplete  Pain control, post dural puncture headache, nerve damage, paralysis, blood pressure Changes, nausea, vomiting, reactions to medications-both toxic and allergic and post Partum back pain. All questions were answered. Patient expressed understanding and wished to proceed. Sterile technique was used throughout procedure. Epidural site was Dressed with sterile barrier dressing. No paresthesias, signs of intravascular injection Or signs of intrathecal spread were encountered.  Patient was more comfortable after the epidural was dosed. Please see RN's note for documentation of vital signs and FHR which are stable.

## 2014-05-05 NOTE — Progress Notes (Signed)
LABOR PROGRESS NOTE  Carla Rollins is a 18 y.o. G1P0000 at [redacted]w[redacted]d  admitted for induction of labor due to Post dates. Due date 04/27/2014.  Subjective: Painfully contracting. Wants epidural.  Objective: BP 128/74  Pulse 80  Temp(Src) 98.5 F (36.9 C) (Oral)  Resp 18  Ht 5' 4.5" (1.638 m)  Wt 201 lb (91.173 kg)  BMI 33.98 kg/m2  SpO2 100%  LMP 07/05/2013 or  Filed Vitals:   05/05/14 0330 05/05/14 0400 05/05/14 0430 05/05/14 0500  BP: 118/67 129/79 128/72 128/74  Pulse: 86 95 77 80  Temp:      TempSrc:      Resp:      Height:      Weight:      SpO2:           FHT:  FHR: 135 bpm, variability: moderate,  accelerations:  Present,  decelerations:  Absent UC:   regular, every 2-4 minutes SVE:   Dilation: 5 Effacement (%): 80 Station: -1 Exam by:: Preet Perrier  Dilation: 5 Effacement (%): 80 Cervical Position: Posterior Station: -1 Presentation: Vertex Exam by:: Valincia Touch  Pitocin @ 8 mu/min  Labs: Lab Results  Component Value Date   WBC 13.0* 05/05/2014   HGB 9.3* 05/05/2014   HCT 29.2* 05/05/2014   MCV 80.7 05/05/2014   PLT 200 05/05/2014    Assessment / Plan: Induction of labor due to postterm,  progressing well on pitocin  Labor: Progressing on Pitocin, will continue to increase then AROM Fetal Wellbeing:  Category I Pain Control:  Epidural Anticipated MOD:  NSVD  William Dalton, MD 05/05/2014, 5:54 AM

## 2014-05-05 NOTE — Lactation Note (Signed)
This note was copied from the chart of Boy Jasimine Simms. Lactation Consultation Note  Patient Name: Boy Shantika Bermea [id number] Date: 05/05/2014 Reason for consult: Other (Comment) (charting for exclusion)   Maternal Data Formula Feeding for Exclusion: Yes Reason for exclusion: Mother's choice to formula feed on admision  Feeding    LATCH Score/Interventions                      Lactation Tools Discussed/Used     Consult Status Consult Status: Complete    Lynda Rainwater 05/05/2014, 4:30 PM

## 2014-05-05 NOTE — Progress Notes (Signed)
LABOR PROGRESS NOTE  Carla Rollins is a 18 y.o. G1P0000 at [redacted]w[redacted]d  admitted for induction of labor due to prolonged pregnancy.  Subjective: Comfortable, feels some contractions even with epidural  Objective: BP 97/51  Pulse 87  Temp(Src) 98.5 F (36.9 C) (Oral)  Resp 18  Ht 5' 4.5" (1.638 m)  Wt 201 lb (91.173 kg)  BMI 33.98 kg/m2  SpO2 99%  LMP 07/05/2013 or  Filed Vitals:   05/05/14 0801 05/05/14 0831 05/05/14 0901 05/05/14 0931  BP: 107/59 119/63 125/69 97/51  Pulse: 87 73 84 87  Temp:   98.5 F (36.9 C)   TempSrc:   Oral   Resp: Height:      Weight:      SpO2:           FHT:  FHR: 140 bpm, variability: moderate,  accelerations:  Present,  decelerations:  Present few lates, few earlies UC:   regular, every 2 minutes SVE:   Dilation: 8 Effacement (%): 90 Station: 0 Exam by:: Dr. Loreta Ave  Dilation: 8 Effacement (%): 90 Cervical Position: Posterior Station: 0 Presentation: Vertex Exam by:: Dr. Loreta Ave  Pitocin @ 12 mu/min  Labs: Lab Results  Component Value Date   WBC 13.0* 05/05/2014   HGB 9.3* 05/05/2014   HCT 29.2* 05/05/2014   MCV 80.7 05/05/2014   PLT 200 05/05/2014    Assessment / Plan: Induction of labor due to postterm,  progressing well on pitocin  Labor: Progressing normally, discontinue pitocin for 1 hour Fetal Wellbeing:  Category II, amnioinfusion, position changes. Pain Control:  Epidural Anticipated MOD:  NSVD  [name] ROCIO, MD 05/05/2014, 10:10 AM

## 2014-05-05 NOTE — Anesthesia Preprocedure Evaluation (Signed)
Anesthesia Evaluation  Patient identified by MRN, date of birth, ID band Patient awake    Reviewed: Allergy & Precautions, H&P , Patient's Chart, lab work & pertinent test results  Airway Mallampati: III TM Distance: >3 FB Neck ROM: Full    Dental no notable dental hx. (+) Teeth Intact   Pulmonary asthma , former smoker,  breath sounds clear to auscultation  Pulmonary exam normal       Cardiovascular negative cardio ROS  Rhythm:Regular Rate:Normal     Neuro/Psych negative neurological ROS  negative psych ROS   GI/Hepatic GERD-  ,(+)     substance abuse  marijuana use,   Endo/Other  Obesity  Renal/GU Hxo recent acute pyelonephritis  negative genitourinary   Musculoskeletal eczema   Abdominal (+) + obese,   Peds  Hematology  (+) anemia ,   Anesthesia Other Findings   Reproductive/Obstetrics                           Anesthesia Physical Anesthesia Plan  ASA: II  Anesthesia Plan: Epidural   Post-op Pain Management:    Induction:   Airway Management Planned: Natural Airway  Additional Equipment:   Intra-op Plan:   Post-operative Plan:   Informed Consent: I have reviewed the patients History and Physical, chart, labs and discussed the procedure including the risks, benefits and alternatives for the proposed anesthesia with the patient or authorized representative who has indicated his/her understanding and acceptance.   Dental advisory given  Plan Discussed with: Anesthesiologist  Anesthesia Plan Comments:         Anesthesia Quick Evaluation

## 2014-05-06 LAB — CBC
HEMATOCRIT: 26.3 % — AB (ref 36.0–46.0)
Hemoglobin: 8.4 g/dL — ABNORMAL LOW (ref 12.0–15.0)
MCH: 25.8 pg — ABNORMAL LOW (ref 26.0–34.0)
MCHC: 31.9 g/dL (ref 30.0–36.0)
MCV: 80.9 fL (ref 78.0–100.0)
PLATELETS: 165 10*3/uL (ref 150–400)
RBC: 3.25 MIL/uL — ABNORMAL LOW (ref 3.87–5.11)
RDW: 14.7 % (ref 11.5–15.5)
WBC: 12.8 10*3/uL — ABNORMAL HIGH (ref 4.0–10.5)

## 2014-05-06 MED ORDER — PNEUMOCOCCAL VAC POLYVALENT 25 MCG/0.5ML IJ INJ
0.5000 mL | INJECTION | INTRAMUSCULAR | Status: AC
Start: 2014-05-07 — End: 2014-05-07
  Administered 2014-05-07: 0.5 mL via INTRAMUSCULAR
  Filled 2014-05-06: qty 0.5

## 2014-05-06 NOTE — Anesthesia Postprocedure Evaluation (Signed)
Anesthesia Post Note  Patient: Carla Rollins  Procedure(s) Performed: * No procedures listed *  Anesthesia type: Epidural  Patient location: Mother/Baby  Post pain: Pain level controlled  Post assessment: Post-op Vital signs reviewed  Last Vitals:  Filed Vitals:   05/06/14 0619  BP: 132/76  Pulse: 77  Temp: 36.7 C  Resp: 18    Post vital signs: Reviewed  Level of consciousness:alert  Complications: No apparent anesthesia complications

## 2014-05-06 NOTE — Progress Notes (Signed)
Ur chart review completed.  

## 2014-05-06 NOTE — Progress Notes (Signed)
Post Partum Day 1 Subjective: no complaints, up ad lib, voiding, tolerating PO and + flatus  Objective: Blood pressure 132/76, pulse 77, temperature 98 F (36.7 C), temperature source Oral, resp. rate 18, height 5' 4.5" (1.638 m), weight 91.173 kg (201 lb), last menstrual period 07/05/2013, SpO2 99.00%, unknown if currently breastfeeding.  Physical Exam:  General: alert, cooperative and no distress Lochia: little to none Uterine Fundus: firm, below the umbilicus Incision: N/A DVT Evaluation: No evidence of DVT seen on physical exam. Negative Homan's sign. No cords or calf tenderness. No significant calf/ankle edema.   Recent Labs  05/04/14 0105 05/05/14 0500  HGB 10.1* 9.3*  HCT 30.6* 29.2*    Assessment/Plan: Plan for discharge tomorrow and Contraception : Nexplanon, Circ Out pt, Bottle feed (formula)   LOS: 3 days   Azucena Cecil, PA-S2 05/06/2014, 6:46 AM   I have seen this patient and agree with the above PA student's note.  LEFTWICH-KIRBY, Nakima Fluegge Certified Nurse-Midwife

## 2014-05-06 NOTE — Progress Notes (Signed)
Clinical Social Work Department PSYCHOSOCIAL ASSESSMENT - MATERNAL/CHILD 05/06/2014  Patient:  Carla Rollins, Carla Rollins  Account Number:  [id number]  [place] Date:  05/03/2014  Ardine Eng Name:   Stevan Born   Clinical Social Worker:  Lucita Ferrara, CLINICAL SOCIAL WORKER   Date/Time:  05/06/2014 10:00 AM  Date Referred:  05/05/2014   Referral source  Central Nursery     Referred reason  Substance Abuse   Other referral source:    I:  FAMILY / [place] legal guardian:  PARENT  Guardian - Name Guardian - Age Egan [address] [place], [place] [address]  Amil Amen  Separate residence   Other household support members/support persons Name Relationship DOB   MOTHER    SISTER 29 years old   Other support:   Per MOB, she lives with her mother and her sister and feels well supported.  FOB stated that he lives with his family, and also expressed that they are supportive.  Per MOB and FOB, all in the family are excited for the birth of the baby and have offered to assistant them as they transition to becoming first time parents.  MOB and FOB stated that they are currently in a relationship, and have had an on/off relationship for the past 5 years.    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 FOB shared that he is also employed.  MOB discussed strong desire to return to work once she is able to do so since she enjoys working.   Financial resources:  Medicaid If Medicaid - County:  [place] Other  [place] / Grade:  N/A Music therapist / Child Services Coordination / Early Interventions:   MOB and FOB declined offer for [facility] referral since they believe they have strong family support.  Cultural issues impacting care:   None reported.    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  MOB had positive UDS for THC in February.  MOB UDS negative upon admission.  Baby's UDS negative, meconium is pending.    V  SOCIAL WORK ASSESSMENT CSW met with MOB and FOB in order to complete the assessment. Consult ordered due to MOB presenting with history of THC use and positive UDS for [facility] in February.  FOB participated minimally but was pleasant when CSW attempted to engage him.  He was observed to primarily being attentive to the newborn.  MOB was easily engaged and receptive to completing the assessment.  MOB presented with full range in affect and presented with appropriate mood for the setting.  MOB acknowledged reason for CSW consult and was open to discussing her THC use.  CSW did not observed or note any acute mental health or psychosocial stressors.   CSW explored initial thoughts and feelings as they learned that they became pregnant, and current thoughts and feelings as they adjust to becoming first time parents.  MOB shared that she was "in shock" when she first learned that she was pregnant, but discussed how it transitioned to excitement due to the  strong support of their families.  She did not express anxiety as she transitions to becoming a parent, but acknowledged that she may experience increase in anxiety due to the role transition.  MOB did express some stress associated with not being able to work during final stages of her pregnancy due to physical discomfort, including some feelings of frustration since she felt like she could not do what she wanted to do.  CSW validated these frustrations, but MOB shared that she feels better now and is looking forward to bonding with her baby before returning to  work.  MOB stated that the family has secured all basic needs for the baby, and all have offered to support them.   MOB receptive to education on postpartum depression.  MOB denied previous mental history and denied acute psychosocial stressors that would increase risk.  MOB stated that she is willing to notify medical providers if she experiences symptoms.    CSW inquired about substance use.  She admits to smoking THC during her pregnancy, but denied belief that it was associated with problematic use. MOB verbalized understanding of drug screen policy and potential CPS involvement if meconium is pending.  Per MOB, she met with a Education officer, museum during her prenatal visits who also provided her with information related to [facility] during pregnancy.  MOB denied anxiety related to potential CPS involvement since she expressed confidence that the meconium drug screen will be negative.   No barriers to discharge.   VI SOCIAL WORK PLAN Social Work Therapist, art  No Further Intervention Required / No Barriers to Discharge   Type of pt/family education:   Postpartum depression and anxiety  Hospital drug screen policy   If child protective services report - county:   If child protective services report - date:   Information/referral to community resources comment:   Other social work plan:   CSW to provide ongoing emotional support PRN.  CSW to monitor meconium drug screen and will CPS report if positive.

## 2014-05-06 NOTE — Progress Notes (Signed)
CSW met with MOB and FOB to complete assessment.  Full documentation to follow.  No barriers to discharge.

## 2014-05-07 MED ORDER — IBUPROFEN 600 MG PO TABS: 600.0000 mg | ORAL_TABLET | Freq: Four times a day (QID) | ORAL | Status: DC | PRN

## 2014-05-07 NOTE — Progress Notes (Signed)
Patient reports asthma as child, no current asthma meds.

## 2014-05-07 NOTE — Discharge Summary (Signed)
Obstetric Discharge Summary Reason for Admission: induction of labor Prenatal Procedures: none Intrapartum Procedures: spontaneous vaginal delivery Postpartum Procedures: none Complications-Operative and Postpartum: 1st degree perineal laceration not requiring repair Hemoglobin  Date Value Ref Range Status  05/06/2014 8.4* 12.0 - 15.0 g/dL Final     HCT  Date Value Ref Range Status  05/06/2014 26.3* 36.0 - 46.0 % Final    Physical Exam:  General: alert, cooperative and no distress Lochia: appropriate Uterine Fundus: firm Incision: N/A DVT Evaluation: No evidence of DVT seen on physical exam. Negative Homan's sign. No cords or calf tenderness. No significant calf/ankle edema.  Discharge Diagnoses: Post-date pregnancy and IOL with NSVD  Delivery Note  At 2:33 PM a viable female was delivered via Vaginal, Spontaneous Delivery (Presentation: Right Occiput Anterior). APGAR: 8, ; weight .  Placenta status: Intact, Spontaneous. Cord: 3 vessels with the following complications: None.  Anesthesia: Epidural  Episiotomy: None  Lacerations: 1st degree;Perineal, small, hemostatic, no repair required  Suture Repair: n/a  Est. Blood Loss (mL): 450, cytotec given PR  Mom to postpartum. Baby to Couplet care / Skin to Skin.      Discharge Information: Date: 05/07/2014 Activity: pelvic rest Diet: routine Medications: PNV, Ibuprofen, Colace and Iron Condition: stable Instructions: refer to practice specific booklet Discharge to: home Follow-up Information   Call [facility]. (6 week appt)    Specialty:  Obstetrics and Gynecology   Contact information:   [address] [place] [address] [phone]      Newborn Data: Live born female  Birth Weight: 7 lb 7 oz (3374 g) APGAR: 8,   Home with mother.  Carla Rollins 05/07/2014, 11:06 AM  I was present for the exam and agree with above.  OP cir Bottle Nexplanon Increase dietary iron  [place],  CNM 05/07/2014 11:07 AM

## 2014-05-07 NOTE — Discharge Instructions (Signed)
Vaginal Delivery, Care After °Refer to this sheet in the next few weeks. These discharge instructions provide you with information on caring for yourself after delivery. Your caregiver may also give you specific instructions. Your treatment has been planned according to the most current medical practices available, but problems sometimes occur. Call your caregiver if you have any problems or questions after you go home. °HOME CARE INSTRUCTIONS °· Take over-the-counter or prescription medicines only as directed by your caregiver or pharmacist. °· Do not drink alcohol, especially if you are breastfeeding or taking medicine to relieve pain. °· Do not chew or smoke tobacco. °· Do not use illegal drugs. °· Continue to use good perineal care. Good perineal care includes: °· Wiping your perineum from front to back. °· Keeping your perineum clean. °· Do not use tampons or douche until your caregiver says it is okay. °· Shower, wash your hair, and take tub baths as directed by your caregiver. °· Wear a well-fitting bra that provides breast support. °· Eat healthy foods. °· Drink enough fluids to keep your urine clear or pale yellow. °· Eat high-fiber foods such as whole grain cereals and breads, brown rice, beans, and fresh fruits and vegetables every day. These foods may help prevent or relieve constipation. °· Follow your caregiver's recommendations regarding resumption of activities such as climbing stairs, driving, lifting, exercising, or traveling. °· Talk to your caregiver about resuming sexual activities. Resumption of sexual activities is dependent upon your risk of infection, your rate of healing, and your comfort and desire to resume sexual activity. °· Try to have someone help you with your household activities and your newborn for at least a few days after you leave the hospital. °· Rest as much as possible. Try to rest or take a nap when your newborn is sleeping. °· Increase your activities gradually. °· Keep  all of your scheduled postpartum appointments. It is very important to keep your scheduled follow-up appointments. At these appointments, your caregiver will be checking to make sure that you are healing physically and emotionally. °SEEK MEDICAL CARE IF:  °· You are passing large clots from your vagina. Save any clots to show your caregiver. °· You have a foul smelling discharge from your vagina. °· You have trouble urinating. °· You are urinating frequently. °· You have pain when you urinate. °· You have a change in your bowel movements. °· You have increasing redness, pain, or swelling near your vaginal incision (episiotomy) or vaginal tear. °· You have pus draining from your episiotomy or vaginal tear. °· Your episiotomy or vaginal tear is separating. °· You have painful, hard, or reddened breasts. °· You have a severe headache. °· You have blurred vision or see spots. °· You feel sad or depressed. °· You have thoughts of hurting yourself or your newborn. °· You have questions about your care, the care of your newborn, or medicines. °· You are dizzy or light-headed. °· You have a rash. °· You have nausea or vomiting. °· You were breastfeeding and have not had a menstrual period within 12 weeks after you stopped breastfeeding. °· You are not breastfeeding and have not had a menstrual period by the 12th week after delivery. °· You have a fever. °SEEK IMMEDIATE MEDICAL CARE IF:  °· You have persistent pain. °· You have chest pain. °· You have shortness of breath. °· You faint. °· You have leg pain. °· You have stomach pain. °· Your vaginal bleeding saturates two or more sanitary pads   in 1 hour. °MAKE SURE YOU:  °· Understand these instructions. °· Will watch your condition. °· Will get help right away if you are not doing well or get worse. °Document Released: 08/18/2000 Document Revised: 01/05/2014 Document Reviewed: 04/17/2012 °ExitCare® Patient Information ©2015 [employer]. This information is not intended to  replace advice given to you by your health care provider. Make sure you discuss any questions you have with your health care provider. ° °Contraception Choices °Contraception (birth control) is the use of any methods or devices to prevent pregnancy. Below are some methods to help avoid pregnancy. °HORMONAL METHODS  °· Contraceptive implant. This is a thin, plastic tube containing progesterone hormone. It does not contain estrogen hormone. Your health care provider inserts the tube in the inner part of the upper arm. The tube can remain in place for up to 3 years. After 3 years, the implant must be removed. The implant prevents the ovaries from releasing an egg (ovulation), thickens the cervical mucus to prevent sperm from entering the uterus, and thins the lining of the inside of the uterus. °· Progesterone-only injections. These injections are given every 3 months by your health care provider to prevent pregnancy. This synthetic progesterone hormone stops the ovaries from releasing eggs. It also thickens cervical mucus and changes the uterine lining. This makes it harder for sperm to survive in the uterus. °· Birth control pills. These pills contain estrogen and progesterone hormone. They work by preventing the ovaries from releasing eggs (ovulation). They also cause the cervical mucus to thicken, preventing the sperm from entering the uterus. Birth control pills are prescribed by a health care provider. Birth control pills can also be used to treat heavy periods. °· Minipill. This type of birth control pill contains only the progesterone hormone. They are taken every day of each month and must be prescribed by your health care provider. °· Birth control patch. The patch contains hormones similar to those in birth control pills. It must be changed once a week and is prescribed by a health care provider. °· Vaginal ring. The ring contains hormones similar to those in birth control pills. It is left in the vagina for  3 weeks, removed for 1 week, and then a new one is put back in place. The patient must be comfortable inserting and removing the ring from the vagina. A health care provider's prescription is necessary. °· Emergency contraception. Emergency contraceptives prevent pregnancy after unprotected sexual intercourse. This pill can be taken right after sex or up to 5 days after unprotected sex. It is most effective the sooner you take the pills after having sexual intercourse. Most emergency contraceptive pills are available without a prescription. Check with your pharmacist. Do not use emergency contraception as your only form of birth control. °BARRIER METHODS  °· Female condom. This is a thin sheath (latex or rubber) that is worn over the penis during sexual intercourse. It can be used with spermicide to increase effectiveness. °· Female condom. This is a soft, loose-fitting sheath that is put into the vagina before sexual intercourse. °· Diaphragm. This is a soft, latex, dome-shaped barrier that must be fitted by a health care provider. It is inserted into the vagina, along with a spermicidal jelly. It is inserted before intercourse. The diaphragm should be left in the vagina for 6 to 8 hours after intercourse. °· Cervical cap. This is a round, soft, latex or plastic cup that fits over the cervix and must be fitted by a health care provider.   The cap can be left in place for up to 48 hours after intercourse. °· Sponge. This is a soft, circular piece of polyurethane foam. The sponge has spermicide in it. It is inserted into the vagina after wetting it and before sexual intercourse. °· Spermicides. These are chemicals that kill or block sperm from entering the cervix and uterus. They come in the form of creams, jellies, suppositories, foam, or tablets. They do not require a prescription. They are inserted into the vagina with an applicator before having sexual intercourse. The process must be repeated every time you have  sexual intercourse. °INTRAUTERINE CONTRACEPTION °· Intrauterine device (IUD). This is a T-shaped device that is put in a woman's uterus during a menstrual period to prevent pregnancy. There are 2 types: °¨ Copper IUD. This type of IUD is wrapped in copper wire and is placed inside the uterus. Copper makes the uterus and fallopian tubes produce a fluid that kills sperm. It can stay in place for 10 years. °¨ Hormone IUD. This type of IUD contains the hormone progestin (synthetic progesterone). The hormone thickens the cervical mucus and prevents sperm from entering the uterus, and it also thins the uterine lining to prevent implantation of a fertilized egg. The hormone can weaken or kill the sperm that get into the uterus. It can stay in place for 3-5 years, depending on which type of IUD is used. °PERMANENT METHODS OF CONTRACEPTION °· Female tubal ligation. This is when the woman's fallopian tubes are surgically sealed, tied, or blocked to prevent the egg from traveling to the uterus. °· Hysteroscopic sterilization. This involves placing a small coil or insert into each fallopian tube. Your doctor uses a technique called hysteroscopy to do the procedure. The device causes scar tissue to form. This results in permanent blockage of the fallopian tubes, so the sperm cannot fertilize the egg. It takes about 3 months after the procedure for the tubes to become blocked. You must use another form of birth control for these 3 months. °· Female sterilization. This is when the female has the tubes that carry sperm tied off (vasectomy). This blocks sperm from entering the vagina during sexual intercourse. After the procedure, the man can still ejaculate fluid (semen). °NATURAL PLANNING METHODS °· Natural family planning. This is not having sexual intercourse or using a barrier method (condom, diaphragm, cervical cap) on days the woman could become pregnant. °· Calendar method. This is keeping track of the length of each menstrual  cycle and identifying when you are fertile. °· Ovulation method. This is avoiding sexual intercourse during ovulation. °· Symptothermal method. This is avoiding sexual intercourse during ovulation, using a thermometer and ovulation symptoms. °· Post-ovulation method. This is timing sexual intercourse after you have ovulated. °Regardless of which type or method of contraception you choose, it is important that you use condoms to protect against the transmission of sexually transmitted infections (STIs). Talk with your health care provider about which form of contraception is most appropriate for you. °Document Released: 08/21/2005 Document Revised: 08/26/2013 Document Reviewed: 02/13/2013 °ExitCare® Patient Information ©2015 [employer]. This information is not intended to replace advice given to you by your health care provider. Make sure you discuss any questions you have with your health care provider. ° °

## 2014-05-24 IMAGING — US US OB COMP LESS 14 WK
1 series · 14 of 28 positions shown · non-contrast
Comparison: None.

CLINICAL DATA: Pregnant, beta HCG 865, right abdominal pain

EXAM:
OBSTETRIC <14 WK ULTRASOUND
TECHNIQUE: Transabdominal ultrasound was performed for evaluation of the
gestation as well as the maternal uterus and adnexal regions.

[Series 1: us ob comp less 14 wks · 14 of 36 slices shown]
[im 2/36]
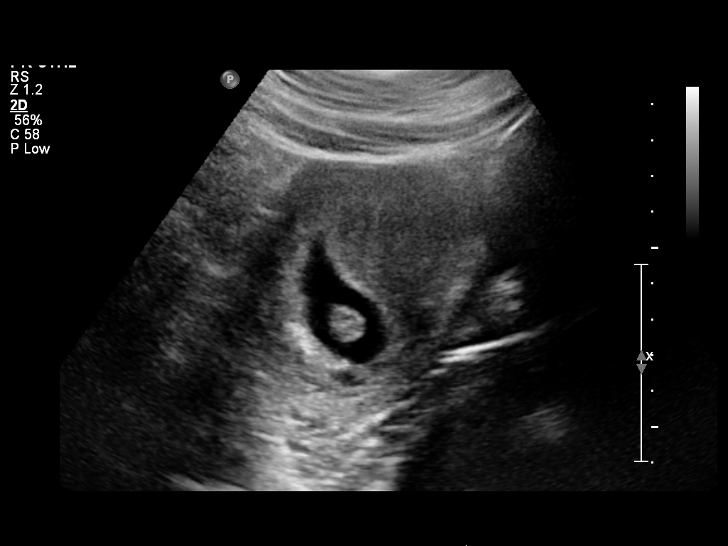
[im 4/36]
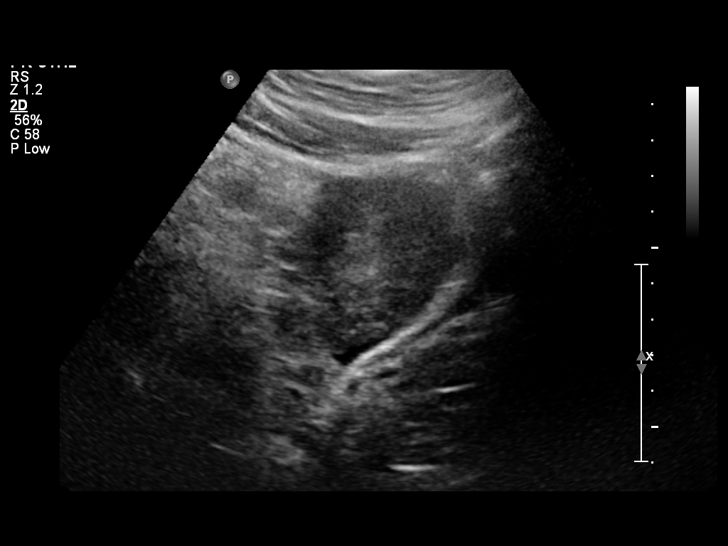
[im 7/36]
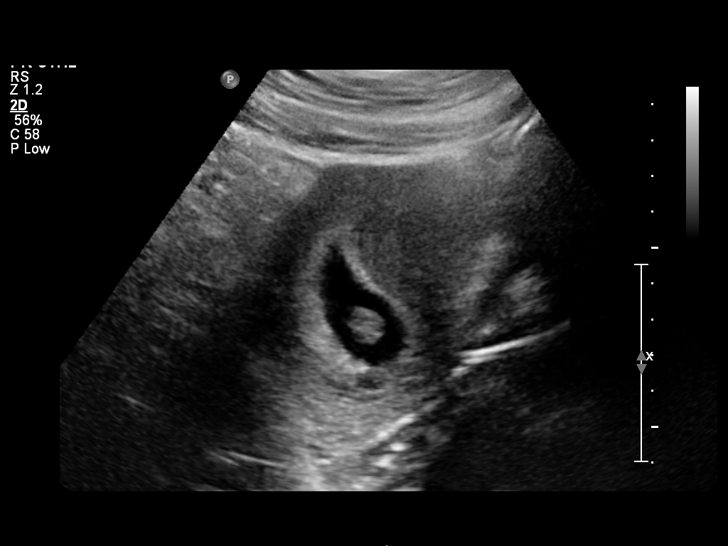
[im 10/36]
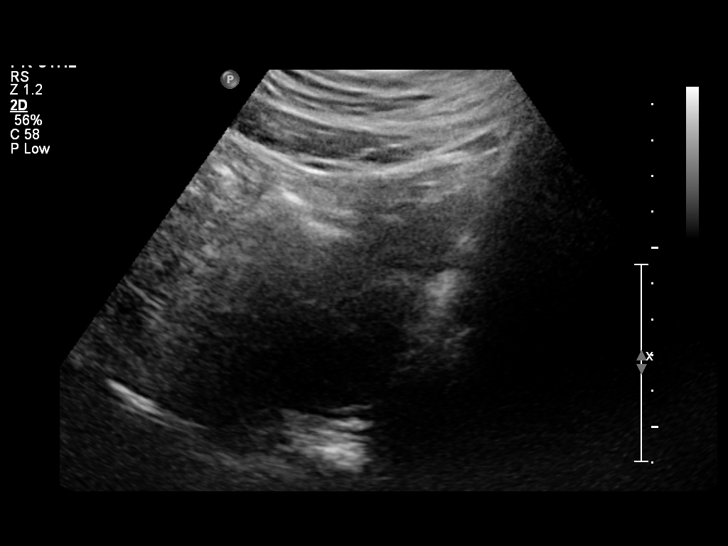
[im 12/36]
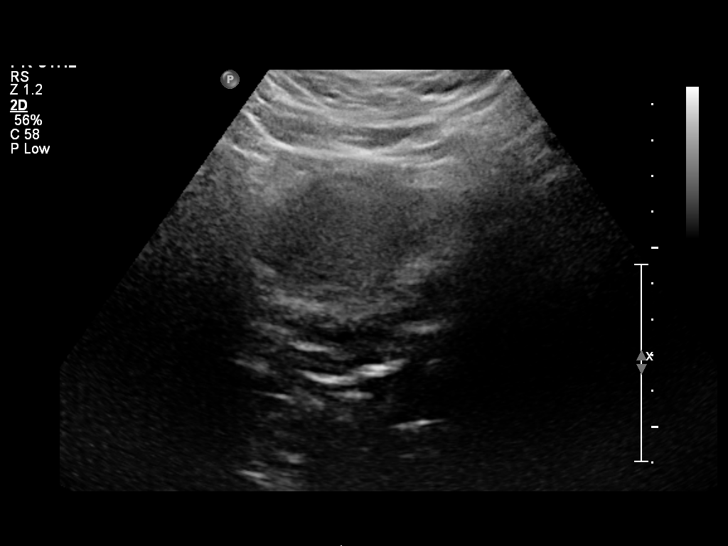
[im 15/36]
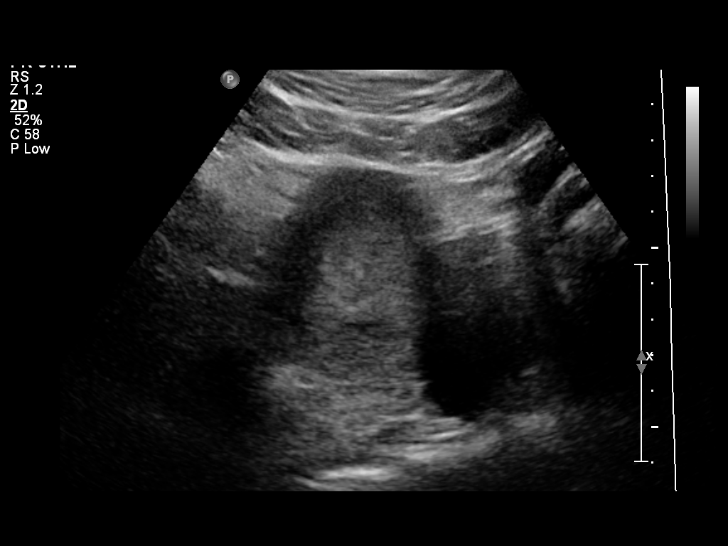
[im 17/36]
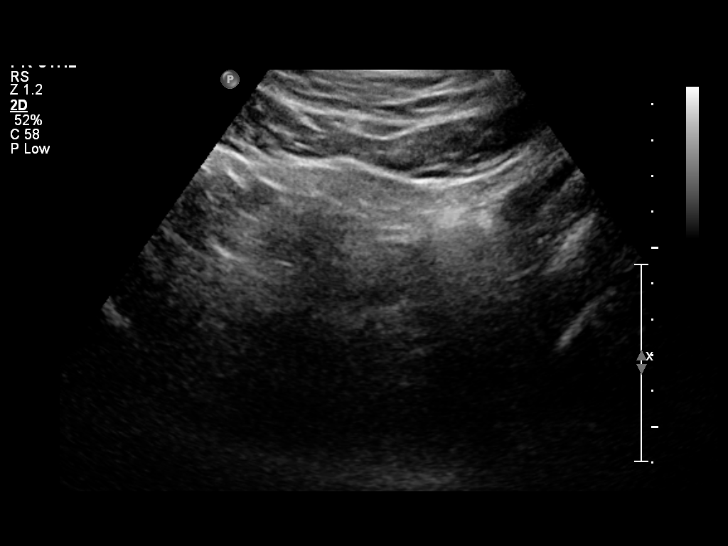
[im 20/36]
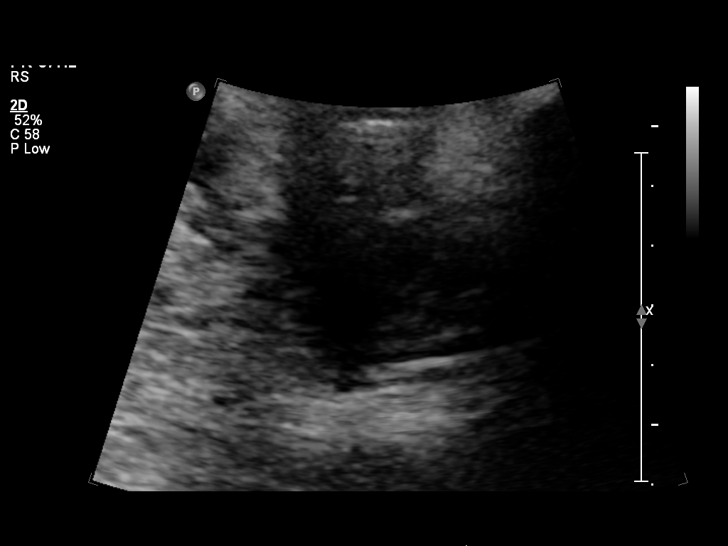
[im 23/36]
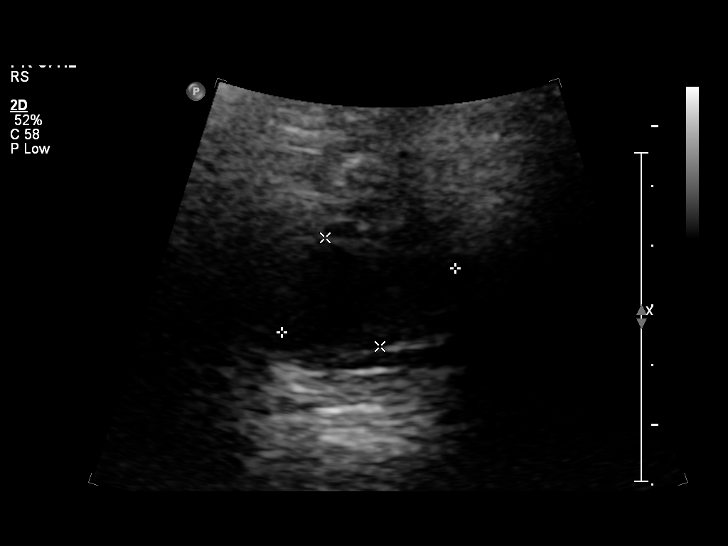
[im 25/36]
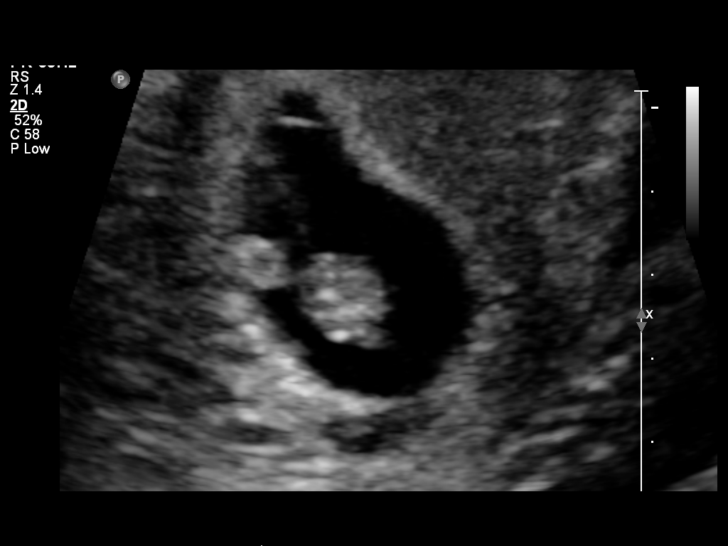
[im 28/36]
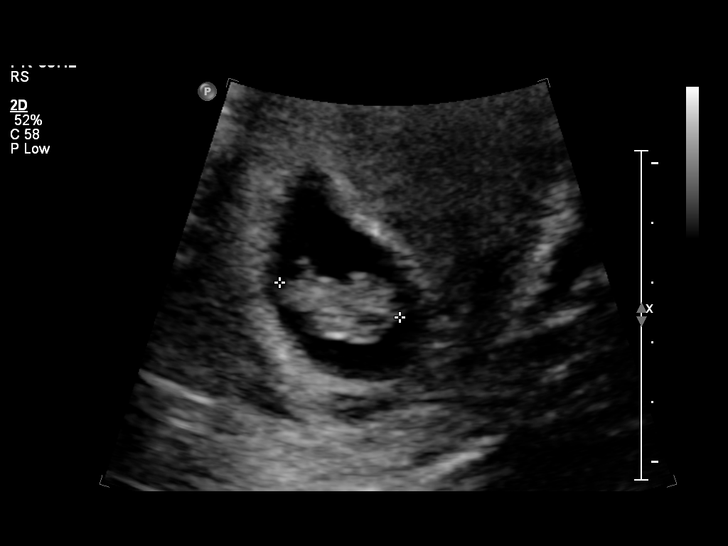
[im 30/36]
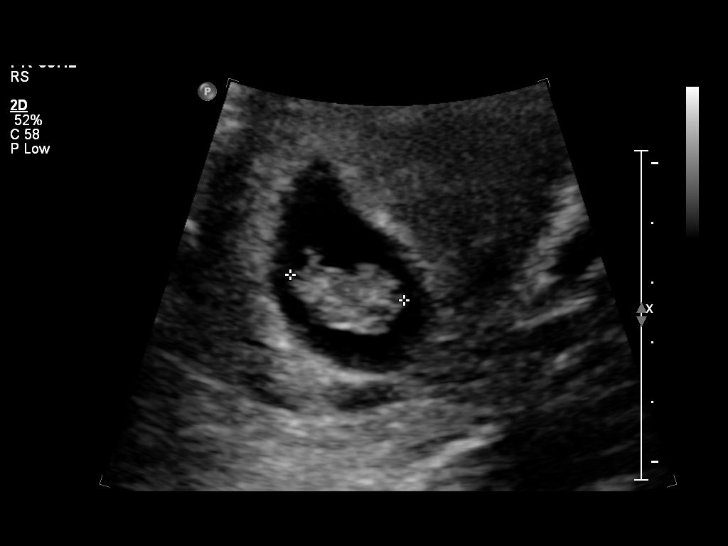
[im 33/36]
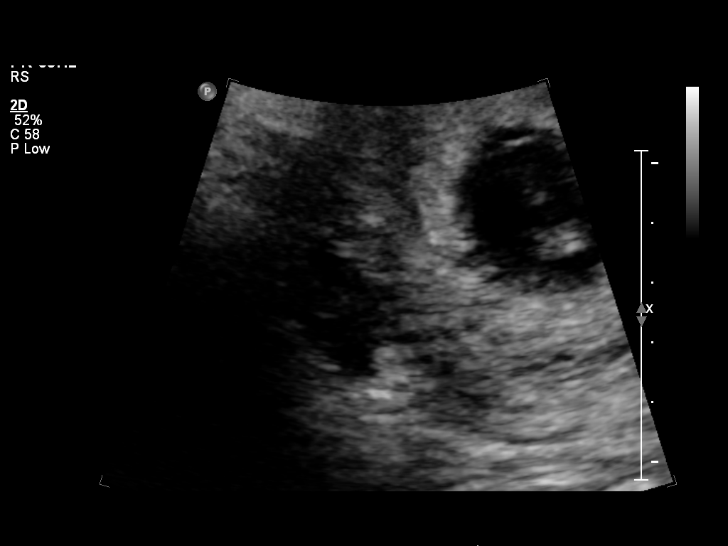
[im 36/36]
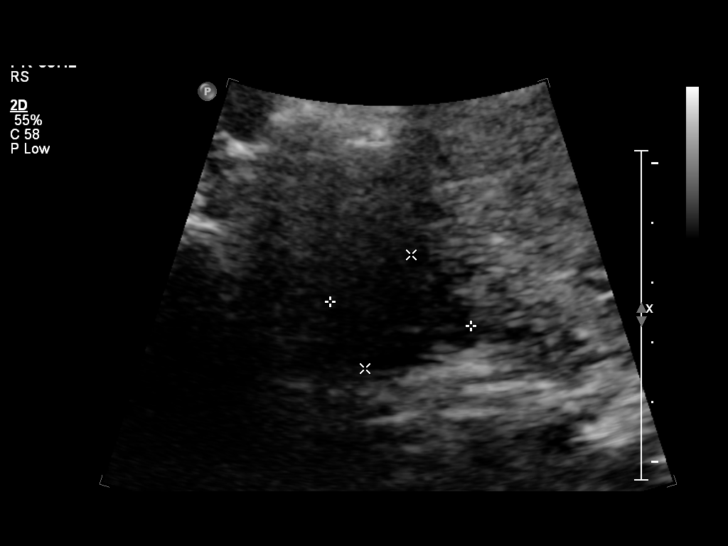

[14 of 28 positions shown; findings below may reference images not displayed]

FINDINGS: Intrauterine gestational sac: Visualized/normal in shape.

Yolk sac:  Present

Embryo:  Present

Cardiac Activity: Present

Heart Rate: 160 bpm

CRL:   20.6  mm   8 w 5 d                  US EDC: 04/27/2014

Maternal uterus/adnexae: No subchorionic hemorrhage.

Right ovary is within normal limits, measuring 2.1 x 2.4 x 1.4 cm.

Left ovary is within normal limits, measuring 2.0 x 3.1 x 2.5 cm.

No free fluid.
IMPRESSION: Single live intrauterine gestation with estimated gestational age 8
weeks 5 days by crown-rump length.

## 2014-06-11 ENCOUNTER — Ambulatory Visit: Payer: Medicaid Other | Admitting: Obstetrics & Gynecology

## 2014-06-23 IMAGING — US US MFM FETAL NUCHAL TRANSLUCENCY
1 series · 13 of 27 positions shown · non-contrast
Comparison: none

[Series 1: us mfm fetal nuchal translucency · 0.19mm/px · 13 of 27 slices shown]
[im 2/27]
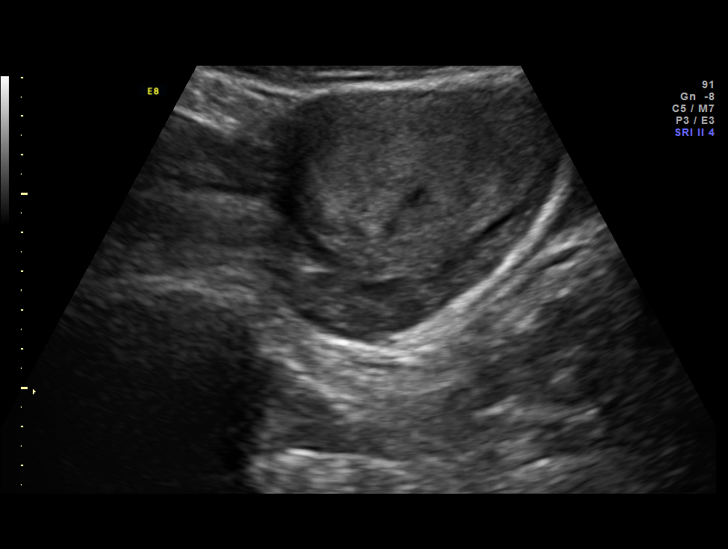
[im 4/27]
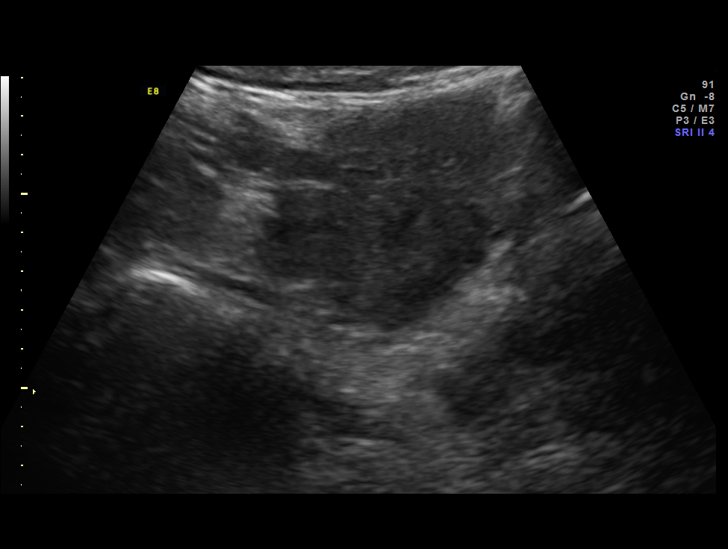
[im 6/27]
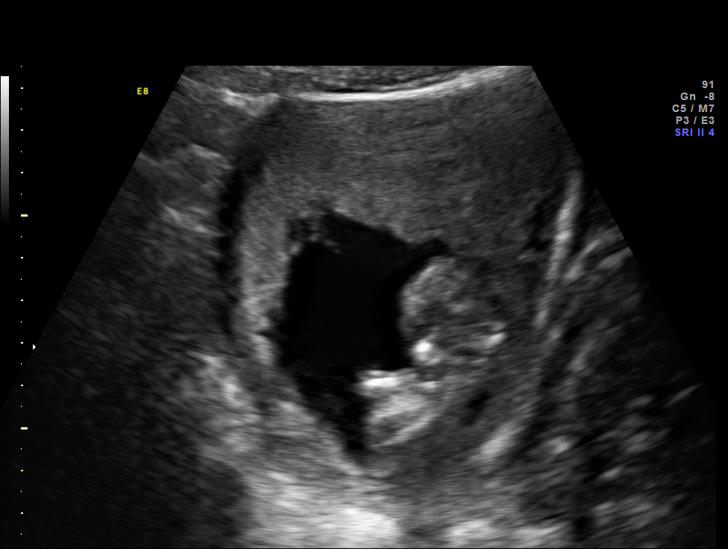
[im 8/27]
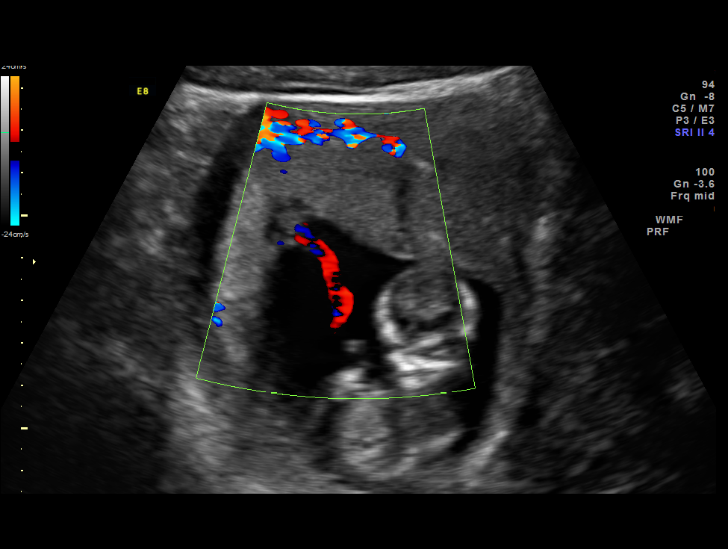
[im 10/27]
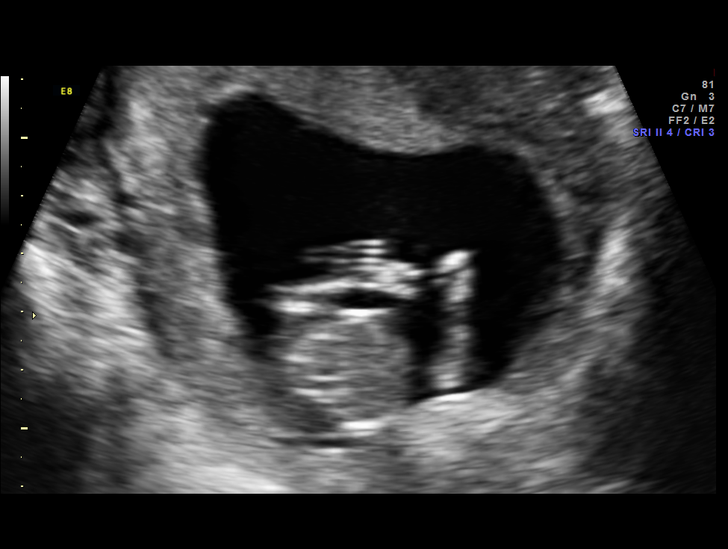
[im 12/27]
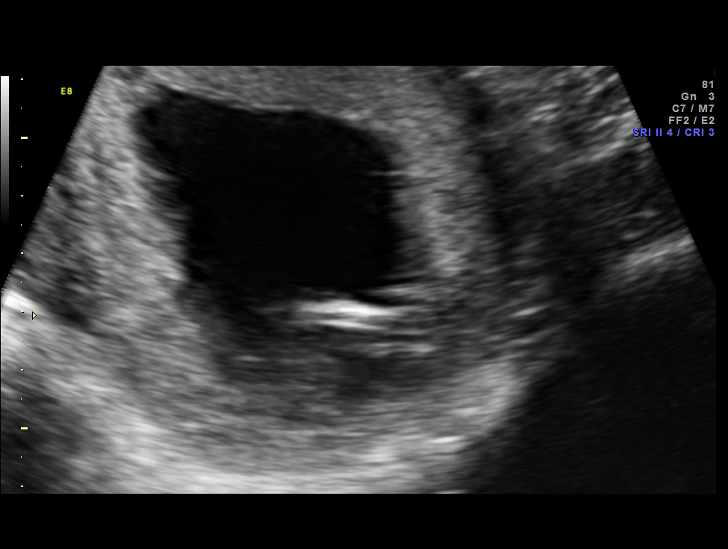
[im 14/27]
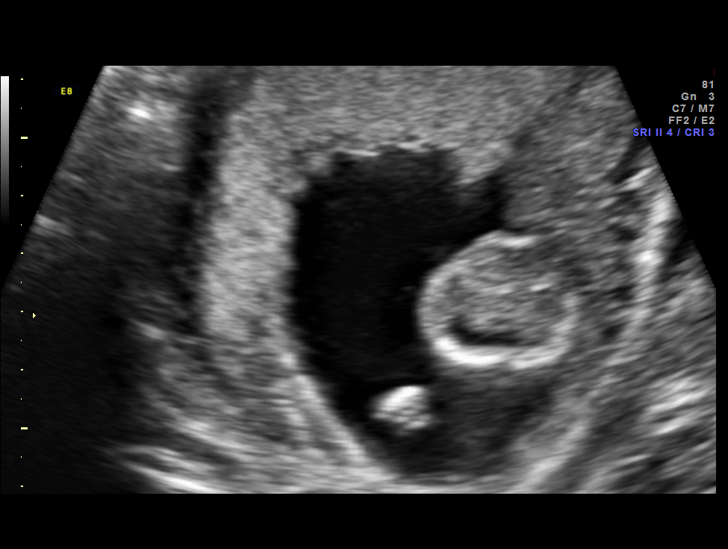
[im 16/27]
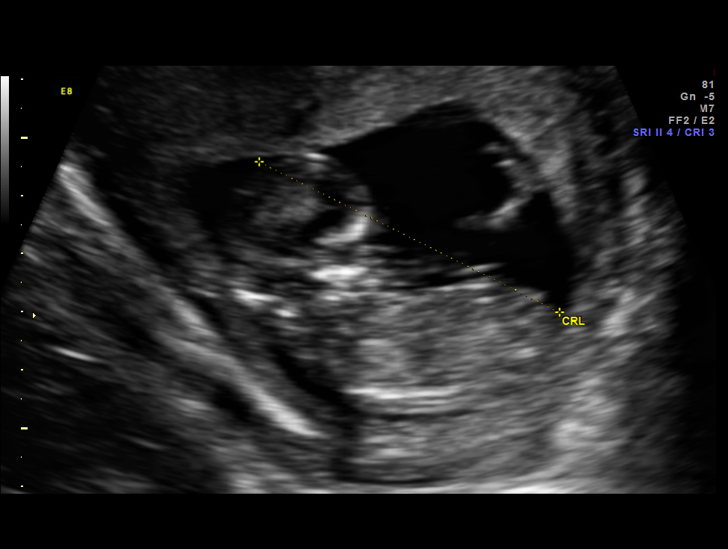
[im 18/27]
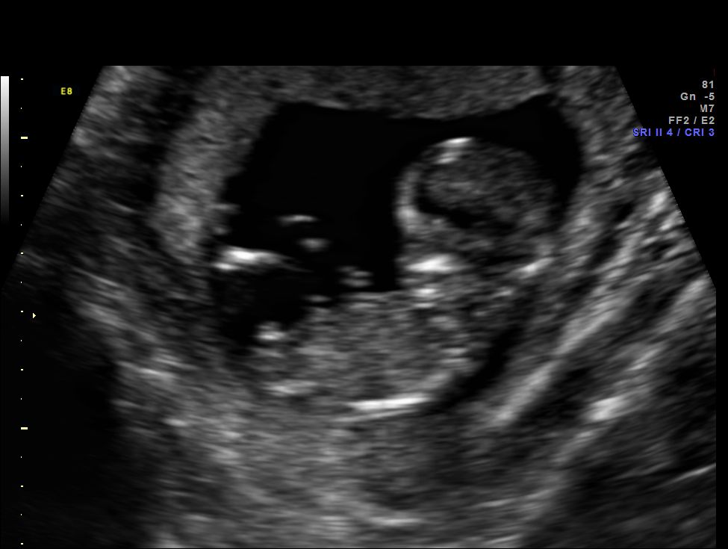
[im 20/27]
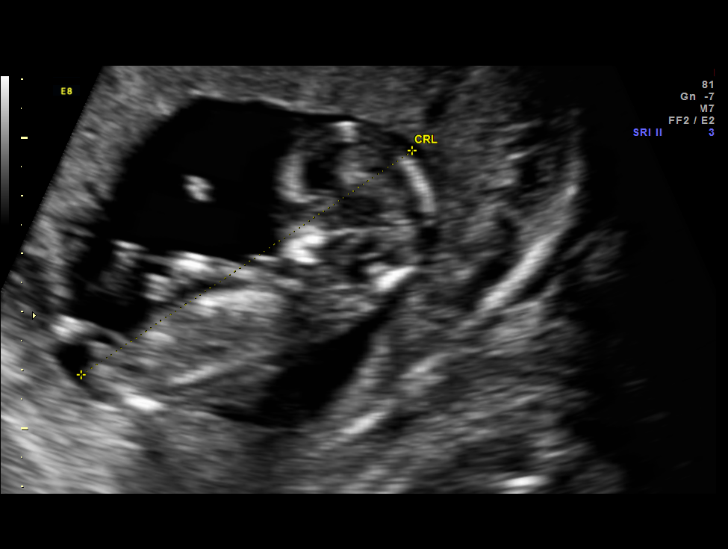
[im 22/27]
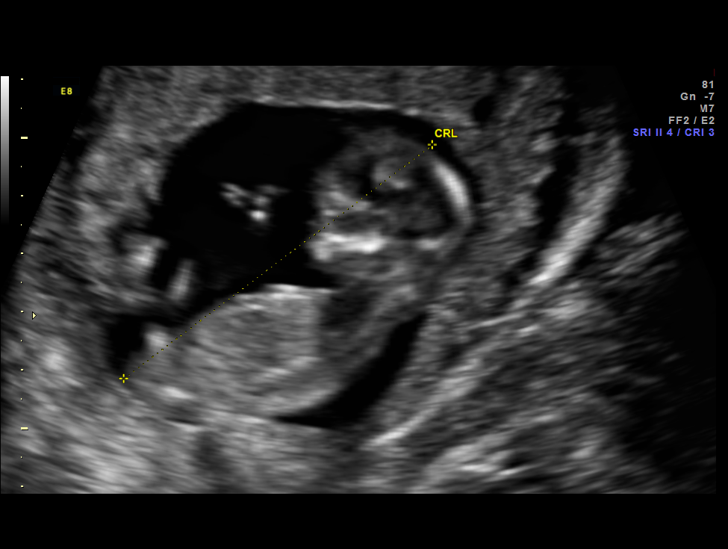
[im 24/27]
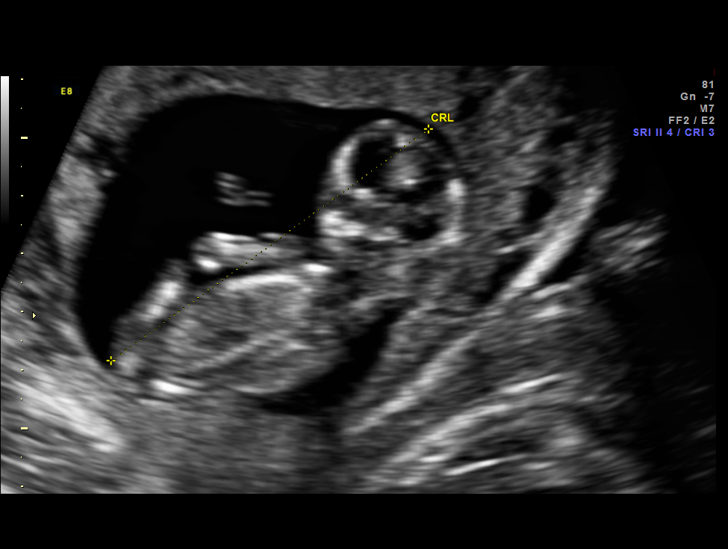
[im 26/27]
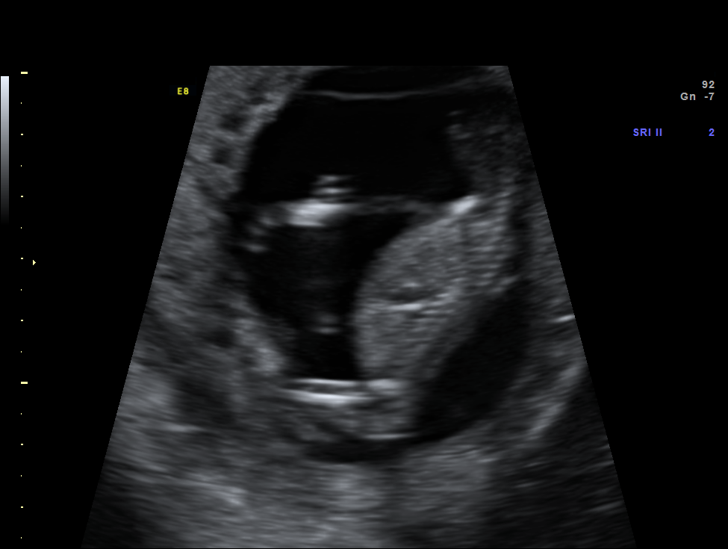

[13 of 27 positions shown; findings below may reference images not displayed]

OBSTETRICS REPORT
                      (Signed Final 10/20/2013 [DATE])

Service(s) Provided

Indications

 First trimester aneuploidy screen (NT)
Fetal Evaluation

 Num Of Fetuses:    1
 Fetal Heart Rate:  173                          bpm
 Cardiac Activity:  Observed
 Presentation:      Variable
 Placenta:          Anterior, above cervical os
 P. Cord            Visualized
 Insertion:

 Amniotic Fluid
 AFI FV:      Subjectively within normal limits
Gestational Age

 LMP:           15w 2d        Date:  07/05/13                 EDD:   04/11/14
 Best:          13w 0d     Det. By:  Early Ultrasound         EDD:   04/27/14
                                     (09/20/13)
1st Trimester Genetic Sonogram Screening

 CRL:            68.8  mm    G. Age:   13w 0d                 EDD:   04/27/14
Cervix Uterus Adnexa

 Cervix:       Normal appearance by transabdominal scan. Appears
               closed, without funnelling.
 Left Ovary:    Not visualized.
 Right Ovary:   Within normal limits.
Impression

 IUP at 13+0 weeks
 No gross abnormalities identified
 NT measurement could not be obtained and nasal bone
 could not be assessed today due to fetal position
 Normal amniotic fluid volume
 Measurements consistent with prior US
Recommendations

 Pt would like quad screen at next OBV
 Offer anatomy U/S by 18 weeks

 questions or concerns.

## 2014-07-06 ENCOUNTER — Encounter (HOSPITAL_COMMUNITY): Payer: Self-pay | Admitting: Anesthesiology

## 2014-07-08 ENCOUNTER — Ambulatory Visit (INDEPENDENT_AMBULATORY_CARE_PROVIDER_SITE_OTHER): Payer: Medicaid Other | Admitting: Pediatrics

## 2014-07-08 ENCOUNTER — Encounter: Payer: Self-pay | Admitting: Pediatrics

## 2014-07-08 VITALS — BP 118/66 | Ht 67.0 in | Wt 190.2 lb

## 2014-07-08 DIAGNOSIS — Z3049 Encounter for surveillance of other contraceptives: Secondary | ICD-10-CM | POA: Diagnosis not present

## 2014-07-08 DIAGNOSIS — Z30017 Encounter for initial prescription of implantable subdermal contraceptive: Secondary | ICD-10-CM

## 2014-07-08 LAB — POCT URINE PREGNANCY: Preg Test, Ur: NEGATIVE

## 2014-07-08 NOTE — Progress Notes (Signed)
History was provided by the patient.  Carla Rollins is a 18 y.o. female who is here for contraception management.  She has a 382 month old child and has requested long term implantable birth control.  She reports having both protected and unprotected sex as recently as 2 weeks ago.  She has her period currently.  Prior periods were regular with normal flow and moderate cramping.  This is her first period since the birth of her son. Last STI screening was 04/2014 and was normal.   The following portions of the patient's history were reviewed and updated as appropriate: allergies, current medications, past family history, past medical history, past social history, past surgical history and problem list.  Physical Exam:  BP 118/66 mmHg  Ht 5\' 7"  (1.702 m)  Wt 190 lb 3.2 oz (86.274 kg)  BMI 29.78 kg/m2  LMP 07/05/2014  Blood pressure percentiles are 72% systolic and 52% diastolic based on 2000 NHANES data.  Patient's last menstrual period was 07/05/2014.    General:   alert and no distress     Skin:   significant acne on face, several tattoos  Eyes:   sclerae white  Nose: clear, no discharge  Extremities:   extremities normal, atraumatic, no cyanosis or edema  Neuro:  muscle tone and strength normal and symmetric    Assessment/Plan: 1. Insertion of implantable subdermal contraceptive  Nexplanon Insertion  No contraindications for placement.  No liver disease, no unexplained vaginal bleeding, no h/o breast cancer, no h/o blood clots.  No LMP recorded.  UHCG: negative  Last Unprotected sex:  2 weeks ago, currently menstruating   Risks & benefits of Nexplanon discussed The nexplanon device was purchased and supplied by [facility]. Packaging instructions supplied to patient Consent form signed  The patient denies any allergies to anesthetics or antiseptics.  Procedure: Pt was placed in supine position. Left arm was flexed at the elbow and externally rotated so that her wrist was  parallel to her ear The medial epicondyle of the left arm was identified The insertions site was marked 8 cm proximal to the medial epicondyle The insertion site was cleaned with Betadine The area surrounding the insertion site was covered with a sterile drape 1% lidocaine was injected just under the skin at the insertion site extending 4 cm proximally. The sterile preloaded disposable Nexaplanon applicator was removed from the sterile packaging The applicator needle was inserted at a 30 degree angle at 8 cm proximal to the medial epicondyle as marked The applicator was lowered to a horizontal position and advanced just under the skin for the full length of the needle The slider on the applicator was retracted fully while the applicator remained in the same position, then the applicator was removed. The implant was confirmed via palpation as being in position The implant position was demonstrated to the patient Pressure dressing was applied to the patient.  The patient was instructed to removed the pressure dressing in 24 hrs.  The patient was advised to move slowly from a supine to an upright position  The patient denied any concerns or complaints  The patient was instructed to schedule a follow-up appt in 1 month and to call sooner if any concerns.  The patient acknowledged agreement and understanding of the plan.   Shelly Rubensteinioffredi,  [place], MD  07/08/2014

## 2014-07-08 NOTE — Patient Instructions (Signed)
Follow-up with Dr. Perry in 1 month. Schedule this appointment before you leave clinic today.  Congratulations on getting your Nexplanon placement!  Below is some important information about Nexplanon.  First remember that Nexplanon does not prevent sexually transmitted infections.  Condoms will help prevent sexually transmitted infections. The Nexplanon starts working 7 days after it was inserted.  There is a risk of getting pregnant if you have unprotected sex in those first 7 days after placement of the Nexplanon.  The Nexplanon lasts for 3 years but can be removed at any time.  You can become pregnant as early as 1 week after removal.  You can have a new Nexplanon put in after the old one is removed if you like.  It is not known whether Nexplanon is as effective in women who are very overweight because the studies did not include many overweight women.  Nexplanon interacts with some medications, including barbiturates, bosentan, carbamazepine, felbamate, griseofulvin, oxcarbazepine, phenytoin, rifampin, St. John's wort, topiramate, HIV medicines.  Please alert your doctor if you are on any of these medicines.  Always tell other healthcare providers that you have a Nexplanon in your arm.  The Nexplanon was placed just under the skin.  Leave the outside bandage on for 24 hours.  Leave the smaller bandage on for 3-5 days or until it falls off on its own.  Keep the area clean and dry for 3-5 days. There is usually bruising or swelling at the insertion site for a few days to a week after placement.  If you see redness or pus draining from the insertion site, call us immediately.  Keep your user card with the date the implant was placed and the date the implant is to be removed.  The most common side effect is a change in your menstrual bleeding pattern.   This bleeding is generally not harmful to you but can be annoying.  Call or come in to see us if you have any concerns about the bleeding or if  you have any side effects or questions.    We will call you in 1 week to check in and we would like you to return to the clinic for a follow-up visit in 1 month.  You can call [facility] 24 hours a day with any questions or concerns.  There is always a nurse or doctor available to take your call.  Call 9-1-1 if you have a life-threatening emergency.  For anything else, please call us at [phone] before heading to the ER.  

## 2014-07-13 NOTE — Progress Notes (Signed)
Attending Co-Signature.  I saw and evaluated the patient, performing the key elements of the service.  I developed the management plan that is described in the resident's note, and I agree with the content.  I supervised the procedure which occurred without complications.  Cain SievePERRY, Copper Basnett FA[place], MD [facility] Specialist

## 2014-07-16 ENCOUNTER — Telehealth: Payer: Self-pay

## 2014-07-16 ENCOUNTER — Encounter: Payer: Self-pay | Admitting: Obstetrics & Gynecology

## 2014-07-16 ENCOUNTER — Ambulatory Visit: Payer: Medicaid Other | Admitting: Obstetrics & Gynecology

## 2014-07-16 NOTE — Telephone Encounter (Signed)
Patient missed PP appointment today. Called patient. No answer. Left message stating we are sorry you missed your appointment, call clinic to reschedule.

## 2014-08-04 ENCOUNTER — Encounter: Payer: Self-pay | Admitting: Obstetrics & Gynecology

## 2014-08-06 ENCOUNTER — Ambulatory Visit: Payer: Medicaid Other | Admitting: Pediatrics

## 2014-10-24 ENCOUNTER — Emergency Department (HOSPITAL_COMMUNITY)
Admission: EM | Admit: 2014-10-24 | Discharge: 2014-10-24 | Disposition: A | Discharge status: Home / Self Care | Payer: Medicaid Other | Attending: Emergency Medicine | Admitting: Emergency Medicine

## 2014-10-24 ENCOUNTER — Encounter (HOSPITAL_COMMUNITY): Payer: Self-pay | Admitting: *Deleted

## 2014-10-24 DIAGNOSIS — Z79899 Other long term (current) drug therapy: Secondary | ICD-10-CM | POA: Insufficient documentation

## 2014-10-24 DIAGNOSIS — Z8619 Personal history of other infectious and parasitic diseases: Secondary | ICD-10-CM | POA: Insufficient documentation

## 2014-10-24 DIAGNOSIS — Z87891 Personal history of nicotine dependence: Secondary | ICD-10-CM | POA: Diagnosis not present

## 2014-10-24 DIAGNOSIS — M545 Low back pain: Secondary | ICD-10-CM | POA: Insufficient documentation

## 2014-10-24 DIAGNOSIS — D72829 Elevated white blood cell count, unspecified: Secondary | ICD-10-CM | POA: Insufficient documentation

## 2014-10-24 DIAGNOSIS — Z3202 Encounter for pregnancy test, result negative: Secondary | ICD-10-CM | POA: Insufficient documentation

## 2014-10-24 DIAGNOSIS — R6883 Chills (without fever): Secondary | ICD-10-CM | POA: Insufficient documentation

## 2014-10-24 DIAGNOSIS — J45909 Unspecified asthma, uncomplicated: Secondary | ICD-10-CM | POA: Diagnosis not present

## 2014-10-24 DIAGNOSIS — R109 Unspecified abdominal pain: Secondary | ICD-10-CM | POA: Diagnosis present

## 2014-10-24 DIAGNOSIS — R197 Diarrhea, unspecified: Secondary | ICD-10-CM | POA: Insufficient documentation

## 2014-10-24 DIAGNOSIS — R112 Nausea with vomiting, unspecified: Secondary | ICD-10-CM | POA: Diagnosis not present

## 2014-10-24 DIAGNOSIS — R1013 Epigastric pain: Secondary | ICD-10-CM | POA: Diagnosis not present

## 2014-10-24 DIAGNOSIS — E876 Hypokalemia: Secondary | ICD-10-CM | POA: Diagnosis not present

## 2014-10-24 LAB — COMPREHENSIVE METABOLIC PANEL
ALBUMIN: 4.1 g/dL (ref 3.5–5.2)
ALT: 18 U/L (ref 0–35)
AST: 25 U/L (ref 0–37)
Alkaline Phosphatase: 62 U/L (ref 39–117)
Anion gap: 10 (ref 5–15)
BUN: 13 mg/dL (ref 6–23)
CO2: 19 mmol/L (ref 19–32)
CREATININE: 0.8 mg/dL (ref 0.50–1.10)
Calcium: 8.9 mg/dL (ref 8.4–10.5)
Chloride: 107 mmol/L (ref 96–112)
GFR calc Af Amer: >90 mL/min (ref >90)
GFR calc non Af Amer: >90 mL/min (ref >90)
Glucose, Bld: 105 mg/dL — ABNORMAL HIGH (ref 70–99)
Potassium: 3.4 mmol/L — ABNORMAL LOW (ref 3.5–5.1)
SODIUM: 136 mmol/L (ref 135–145)
TOTAL PROTEIN: 7.5 g/dL (ref 6.0–8.3)
Total Bilirubin: 0.5 mg/dL (ref 0.3–1.2)

## 2014-10-24 LAB — URINE MICROSCOPIC-ADD ON

## 2014-10-24 LAB — POC URINE PREG, ED: Preg Test, Ur: NEGATIVE

## 2014-10-24 LAB — URINALYSIS, ROUTINE W REFLEX MICROSCOPIC
BILIRUBIN URINE: NEGATIVE
Glucose, UA: NEGATIVE mg/dL
Ketones, ur: NEGATIVE mg/dL
Leukocytes, UA: NEGATIVE
Nitrite: NEGATIVE
PROTEIN: 30 mg/dL — AB
Specific Gravity, Urine: 1.038 — ABNORMAL HIGH (ref 1.005–1.030)
UROBILINOGEN UA: 1 mg/dL (ref 0.0–1.0)
pH: 6 (ref 5.0–8.0)

## 2014-10-24 LAB — CBC WITH DIFFERENTIAL/PLATELET
Basophils Absolute: 0 10*3/uL (ref 0.0–0.1)
Basophils Relative: 0 % (ref 0–1)
EOS ABS: 0.2 10*3/uL (ref 0.0–0.7)
Eosinophils Relative: 1 % (ref 0–5)
HCT: 37.3 % (ref 36.0–46.0)
HEMOGLOBIN: 12.4 g/dL (ref 12.0–15.0)
LYMPHS ABS: 2.5 10*3/uL (ref 0.7–4.0)
Lymphocytes Relative: 18 % (ref 12–46)
MCH: 28.1 pg (ref 26.0–34.0)
MCHC: 33.2 g/dL (ref 30.0–36.0)
MCV: 84.6 fL (ref 78.0–100.0)
Monocytes Absolute: 0.8 10*3/uL (ref 0.1–1.0)
Monocytes Relative: 6 % (ref 3–12)
NEUTROS ABS: 10.5 10*3/uL — AB (ref 1.7–7.7)
Neutrophils Relative %: 75 % (ref 43–77)
PLATELETS: 305 10*3/uL (ref 150–400)
RBC: 4.41 MIL/uL (ref 3.87–5.11)
RDW: 13.8 % (ref 11.5–15.5)
WBC: 14 10*3/uL — ABNORMAL HIGH (ref 4.0–10.5)

## 2014-10-24 LAB — LIPASE, BLOOD: Lipase: 24 U/L (ref 11–59)

## 2014-10-24 MED ORDER — ONDANSETRON HCL 4 MG/2ML IJ SOLN
4.0000 mg | Freq: Once | INTRAMUSCULAR | Status: AC
  Administered 2014-10-24: 4 mg via INTRAVENOUS

## 2014-10-24 MED ORDER — MORPHINE SULFATE 4 MG/ML IJ SOLN
4.0000 mg | Freq: Once | INTRAMUSCULAR | Status: AC
  Administered 2014-10-24: 4 mg via INTRAVENOUS
  Filled 2014-10-24: qty 1

## 2014-10-24 MED ORDER — SODIUM CHLORIDE 0.9 % IV BOLUS (SEPSIS)
1000.0000 mL | Freq: Once | INTRAVENOUS | Status: AC
  Administered 2014-10-24: 1000 mL via INTRAVENOUS

## 2014-10-24 MED ORDER — ONDANSETRON HCL 4 MG PO TABS: 4.0000 mg | ORAL_TABLET | Freq: Four times a day (QID) | ORAL | Status: DC | PRN

## 2014-10-24 NOTE — Discharge Instructions (Signed)

## 2014-10-24 NOTE — ED Notes (Signed)
MD at bedside. 

## 2014-10-24 NOTE — ED Notes (Signed)
abd pain n v  All day she is here with her baby who has the same  lmp now

## 2014-10-24 NOTE — ED Provider Notes (Signed)
CSN: [id number]     Arrival date & time 10/24/14  2042 History   First MD Initiated Contact with Patient 10/24/14 2102     Chief Complaint  Patient presents with  . Abdominal Pain   (Consider location/radiation/quality/duration/timing/severity/associated sxs/prior Treatment) HPI Comments: 19 yo F hx of asthma, presents with CC nausea, vomiting.  Pt states symptoms started this morning with epigastric abdominal pain and low back pain.  Pt states after work around 4 PM, she started having nausea, vomiting, epigastric pain.  Pt thinks she may have had a loose stool as well.  Subjective chills, but denies fever, CP, SOB, cough, sore throat, ear ache, dysuria, vaginal discharge, rash, or myalgias.  Pt currently on her LMP.  Pt states her son is also sick with similar symptoms, being seen in pediatric ED at this time.  No other concerns.    The history is provided by the patient. No language interpreter was used.    Past Medical History  Diagnosis Date  . Asthma   . Infection   . Chlamydia 2013   Past Surgical History  Procedure Laterality Date  . No past surgeries     Family History  Problem Relation Age of Onset  . Diabetes Maternal Grandmother   . Cancer Maternal Grandfather    History  Substance Use Topics  . Smoking status: Former Smoker -- 1 years    Types: Cigarettes  . Smokeless tobacco: Never Used  . Alcohol Use: No     Comment: not during pregnancy   OB History    Gravida Para Term Preterm AB TAB SAB Ectopic Multiple Living   0 0 0 0 0 0 1     Review of Systems  Constitutional: Positive for chills. Negative for fever.  Respiratory: Negative for cough and shortness of breath.   Cardiovascular: Negative for chest pain.  Gastrointestinal: Positive for nausea, vomiting, abdominal pain and diarrhea. Negative for constipation and abdominal distention.  Genitourinary: Negative for dysuria and vaginal discharge.  Musculoskeletal: Negative for myalgias.  Skin:  Negative for rash.  Neurological: Negative for dizziness, weakness, light-headedness, numbness and headaches.  Hematological: Negative for adenopathy. Does not bruise/bleed easily.  All other systems reviewed and are negative.     Allergies  Review of patient's allergies indicates no known allergies.  Home Medications   Prior to Admission medications   Medication Sig Start Date End Date Taking? Authorizing Provider  acetaminophen (TYLENOL) 325 MG tablet Take 650 mg by mouth every 6 (six) hours as needed for moderate pain.    Historical Provider, MD  cyclobenzaprine (FLEXERIL) 5 MG tablet Take 1 tablet (5 mg total) by mouth 3 (three) times daily as needed for muscle spasms. 04/23/14   Deirdre Colin Mulders, CNM  ibuprofen (ADVIL,MOTRIN) 600 MG tablet Take 1 tablet (600 mg total) by mouth every 6 (six) hours as needed for moderate pain or cramping. 05/07/14   Dorathy Kinsman, CNM  Prenatal Vit-Fe Fumarate-FA (PRENATAL MULTIVITAMIN) TABS tablet Take 1 tablet by mouth daily at 12 noon.    Historical Provider, MD   BP 134/68 mmHg  Pulse 94  Temp(Src) 99.6 F (37.6 C) (Oral)  Resp 18  Ht  (1.702 m)  Wt 180 lb (81.647 kg)  BMI 28.19 kg/m2  SpO2 100%  LMP 10/24/2014 Physical Exam  Constitutional: She is oriented to person, place, and time. She appears well-developed and well-nourished.  HENT:  Head: Normocephalic and atraumatic.  Right Ear: External ear normal.  Left Ear:  External ear normal.  Mouth/Throat: Oropharynx is clear and moist.  Eyes: Conjunctivae and EOM are normal. Pupils are equal, round, and reactive to light.  Neck: Normal range of motion. Neck supple.  Cardiovascular: Normal rate, regular rhythm, normal heart sounds and intact distal pulses.   Pulmonary/Chest: Effort normal and breath sounds normal. No respiratory distress. She has no wheezes. She has no rales. She exhibits no tenderness.  Abdominal: Soft. Bowel sounds are normal. She exhibits no distension and no mass.  There is tenderness. There is no rebound and no guarding.  Soft, mild epigastric TTP, no guarding, no rebound.   Musculoskeletal: Normal range of motion.  Neurological: She is alert and oriented to person, place, and time.  Skin: Skin is warm and dry.  Nursing note and vitals reviewed.   ED Course  Procedures (including critical care time) Labs Review Labs Reviewed  CBC WITH DIFFERENTIAL/PLATELET - Abnormal; Notable for the following:    WBC 14.0 (*)    Neutro Abs 10.5 (*)    All other components within normal limits  COMPREHENSIVE METABOLIC PANEL - Abnormal; Notable for the following:    Potassium 3.4 (*)    Glucose, Bld 105 (*)    All other components within normal limits  URINALYSIS, ROUTINE W REFLEX MICROSCOPIC - Abnormal; Notable for the following:    Specific Gravity, Urine 1.038 (*)    Hgb urine dipstick LARGE (*)    Protein, ur 30 (*)    All other components within normal limits  URINE MICROSCOPIC-ADD ON - Abnormal; Notable for the following:    Squamous Epithelial / LPF FEW (*)    Bacteria, UA FEW (*)    All other components within normal limits  LIPASE, BLOOD  POC URINE PREG, ED    Imaging Review No results found.   EKG Interpretation None      MDM   Final diagnoses:  Non-intractable vomiting with nausea, vomiting of unspecified type   19 yo F hx of asthma, presents with CC nausea, vomiting.  Physical exam as above. VS WNL.  Pt vomiting on initial exam.  Given Zofran, morphine, IVF.  On reeval pt symptoms much improved, and abdomen with only mild epigastric TTP, soft, nonperitonitic.  Will hold off on imaging of abdomen at this time.  No pelvic pain, thus pelvic exam deferred.    Labs demonstrate mild leukocytosis to 14, K slightly low 3.4, Lipase WNL, UA negative for infection, Pregnancy test is negative.   On reeval pt sleeping, and states she feels better when woken up.  States she feels okay to go home at this time.    Diagnosis of viral syndrome,  gastroenteritis.  Rx Zofran. F/u with PCP in 1 week.  Return precautions given.  Pt understands and agrees with plan.   Jon GillsWebb, Vere Diantonio  Discussed pt with my attending Dr. Romeo AppleHarrison.      Jon GillsZach Doha Boling, MD 10/25/14 0001  Purvis SheffieldForrest Harrison, MD 10/25/14 [phone]

## 2015-06-16 ENCOUNTER — Encounter (HOSPITAL_COMMUNITY): Payer: Self-pay | Admitting: Emergency Medicine

## 2015-06-16 ENCOUNTER — Emergency Department (HOSPITAL_COMMUNITY)
Admission: EM | Admit: 2015-06-16 | Discharge: 2015-06-16 | Disposition: A | Discharge status: Home / Self Care | Payer: Medicaid Other | Attending: Emergency Medicine | Admitting: Emergency Medicine

## 2015-06-16 DIAGNOSIS — J45909 Unspecified asthma, uncomplicated: Secondary | ICD-10-CM | POA: Insufficient documentation

## 2015-06-16 DIAGNOSIS — Z8619 Personal history of other infectious and parasitic diseases: Secondary | ICD-10-CM | POA: Insufficient documentation

## 2015-06-16 DIAGNOSIS — Z87891 Personal history of nicotine dependence: Secondary | ICD-10-CM | POA: Insufficient documentation

## 2015-06-16 DIAGNOSIS — R51 Headache: Secondary | ICD-10-CM

## 2015-06-16 DIAGNOSIS — R519 Headache, unspecified: Secondary | ICD-10-CM

## 2015-06-16 DIAGNOSIS — K047 Periapical abscess without sinus: Secondary | ICD-10-CM

## 2015-06-16 DIAGNOSIS — K029 Dental caries, unspecified: Secondary | ICD-10-CM | POA: Insufficient documentation

## 2015-06-16 MED ORDER — PENICILLIN V POTASSIUM 500 MG PO TABS: 500.0000 mg | ORAL_TABLET | Freq: Four times a day (QID) | ORAL | Status: AC

## 2015-06-16 MED ORDER — HYDROCODONE-ACETAMINOPHEN 5-325 MG PO TABS
1.0000 | ORAL_TABLET | Freq: Once | ORAL | Status: AC
  Administered 2015-06-16: 1 via ORAL
  Filled 2015-06-16: qty 1

## 2015-06-16 MED ORDER — KETOROLAC TROMETHAMINE 60 MG/2ML IM SOLN
30.0000 mg | Freq: Once | INTRAMUSCULAR | Status: AC
  Administered 2015-06-16: 30 mg via INTRAMUSCULAR
  Filled 2015-06-16: qty 2

## 2015-06-16 MED ORDER — IBUPROFEN 800 MG PO TABS: 800.0000 mg | ORAL_TABLET | Freq: Three times a day (TID) | ORAL | Status: DC

## 2015-06-16 NOTE — Discharge Instructions (Signed)
Take ibuprofen as prescribed for pain. Take antibiotics as prescribed until all gone. Please follow-up with a dentist as referred. You must call him within 48 hours to get an appointment.  the return if worsening.   Dental Caries Dental caries is tooth decay. This decay can cause a hole in teeth (cavity) that can get bigger and deeper over time. HOME CARE  Brush and floss your teeth. Do this at least two times a day.  Use a fluoride toothpaste.  Use a mouth rinse if told by your dentist or doctor.  Eat less sugary and starchy foods. Drink less sugary drinks.  Avoid snacking often on sugary and starchy foods. Avoid sipping often on sugary drinks.  Keep regular checkups and cleanings with your dentist.  Use fluoride supplements if told by your dentist or doctor.  Allow fluoride to be applied to teeth if told by your dentist or doctor.   This information is not intended to replace advice given to you by your health care provider. Make sure you discuss any questions you have with your health care provider.   Document Released: 05/30/2008 Document Revised: 09/11/2014 Document Reviewed: 08/23/2012 Elsevier Interactive Patient Education [employer]2016 Elsevier Inc.

## 2015-06-16 NOTE — ED Notes (Signed)
Onset of dental pain 2 months ago with pain increasing overtime with headache. Denies nausea, emesis, or diarrhea.

## 2015-06-16 NOTE — ED Provider Notes (Signed)
CSN: [id number]     Arrival date & time 06/16/15  1846 History  By signing my name below, I, Budd Palmer, attest that this documentation has been prepared under the direction and in the presence of [employer], PA-C. Electronically Signed: Budd Palmer, ED Scribe. 06/16/2015. 8:49 PM.    Chief Complaint  Patient presents with  . Headache  . Dental Pain   The history is provided by the patient. No language interpreter was used.   HPI Comments: Carla Rollins is a 19 y.o. female who presents to the Emergency Department complaining of constant, worsening headache and left-sided upper and lower throbbing dental pain radiating to the ear onset 2-3 months ago. She reports associated ringing in the ears. Pt states her wisdom teeth came in, but that she does not have the insurance coverage to get them removed. She notes she had an appointment set up, but that it was canceled because her medicaid plan does not cover it. She states she has tried tylenol, ibuprofen, and Orajel with no relief. Pt denies fever.  Past Medical History  Diagnosis Date  . Asthma   . Infection   . Chlamydia 2013   Past Surgical History  Procedure Laterality Date  . No past surgeries     Family History  Problem Relation Age of Onset  . Diabetes Maternal Grandmother   . Cancer Maternal Grandfather    Social History  Substance Use Topics  . Smoking status: Former Smoker -- 1 years    Types: Cigarettes  . Smokeless tobacco: Never Used  . Alcohol Use: No     Comment: not during pregnancy   OB History    Gravida Para Term Preterm AB TAB SAB Ectopic Multiple Living   0 0 0 0 0 0 1     Review of Systems  Constitutional: Negative for fever.  HENT: Positive for dental problem.   Neurological: Positive for headaches.    Allergies  Review of patient's allergies indicates no known allergies.  Home Medications   Prior to Admission medications   Medication Sig Start Date End Date Taking?  Authorizing Provider  acetaminophen (TYLENOL) 500 MG tablet Take 1,500 mg by mouth every 6 (six) hours as needed for mild pain.    Historical Provider, MD  cyclobenzaprine (FLEXERIL) 5 MG tablet Take 1 tablet (5 mg total) by mouth 3 (three) times daily as needed for muscle spasms. Patient not taking: Reported on 10/24/2014 04/23/14   Deirdre Colin Mulders, CNM  etonogestrel (NEXPLANON) 68 MG IMPL implant 1 each by Subdermal route once. Nov. 18, 2015    Historical Provider, MD  ibuprofen (ADVIL,MOTRIN) 600 MG tablet Take 1 tablet (600 mg total) by mouth every 6 (six) hours as needed for moderate pain or cramping. Patient not taking: Reported on 10/24/2014 05/07/14   Dorathy Kinsman, CNM  ondansetron (ZOFRAN) 4 MG tablet Take 1 tablet (4 mg total) by mouth every 6 (six) hours as needed for nausea or vomiting. 10/24/14   Jon Gills, MD   BP 125/71 mmHg  Pulse 80  Temp(Src) 99.5 F (37.5 C) (Oral)  Resp 16  Ht  (1.702 m)  Wt 205 lb (92.987 kg)  BMI 32.10 kg/m2  SpO2 100% Physical Exam  Constitutional: She is oriented to person, place, and time. She appears well-developed and well-nourished.  HENT:  Head: Normocephalic.  Overall good dentition. Left upper third molar broken off and with obvious decay. Surrounding gum swelling. Tender to palpation. No trismus. No swelling  under the tongue. Ear canals and TMs are normal bilaterally.  Eyes: EOM are normal.  Neck: Normal range of motion. Neck supple.  Pulmonary/Chest: Effort normal.  Abdominal: She exhibits no distension.  Musculoskeletal: Normal range of motion.  Neurological: She is alert and oriented to person, place, and time.  Psychiatric: She has a normal mood and affect.  Nursing note and vitals reviewed.   ED Course  Procedures  DIAGNOSTIC STUDIES: Oxygen Saturation is 100% on RA, normal by my interpretation.    COORDINATION OF CARE: 8:47 PM - Discussed chipped molar with probable infection on the upper left gum.Discussed plans to order  antibiotics and ibuprofen. Will give a dose of Vicodin and Toradol before discharge. Will refer to a dentist. Advised to adhere to recommended ibuprofen dosage and take tylenol as well. Pt advised of plan for treatment and pt agrees.  Labs Review Labs Reviewed - No data to display  Imaging Review No results found. I have personally reviewed and evaluated these images and lab results as part of my medical decision-making.   EKG Interpretation None      MDM   Final diagnoses:  Infected dental carries  Nonintractable headache, unspecified chronicity pattern, unspecified headache type    patient with dental caries to the left upper third molar. I suspect this is most likely the cause of patient's dental pain and headache. She reports no other neurovascular complaints. She is afebrile, nontoxic appearing. Normal vital signs. Patient states she has been unable to get in with a dentist because of her insurance. Will start on penicillin for most likely early dental abscess. Will give ibuprofen for pain. She was given Toradol and Vicodin emergency department for acute pain control. Home with follow-up with her dentist, referral given.  Filed Vitals:   06/16/15 1906  BP: 125/71  Pulse: 80  Temp: 99.5 F (37.5 C)  TempSrc: Oral  Resp: 16  Height: 5\' 7"  (1.702 m)  Weight: 205 lb (92.987 kg)  SpO2: 100%   I personally performed the services described in this documentation, which was scribed in my presence. The recorded information has been reviewed and is accurate.   Jaynie Crumbleatyana Kamari Bilek, PA-C 06/16/15 2056  Lorre NickAnthony Allen, MD 06/16/15 [phone]

## 2015-06-16 NOTE — ED Notes (Signed)
Called for triage x3

## 2016-04-26 ENCOUNTER — Emergency Department (HOSPITAL_COMMUNITY)
Admission: EM | Admit: 2016-04-26 | Discharge: 2016-04-26 | Disposition: A | Discharge status: Home / Self Care | Payer: Medicaid Other | Attending: Emergency Medicine | Admitting: Emergency Medicine

## 2016-04-26 ENCOUNTER — Encounter (HOSPITAL_COMMUNITY): Payer: Self-pay | Admitting: Emergency Medicine

## 2016-04-26 DIAGNOSIS — J45909 Unspecified asthma, uncomplicated: Secondary | ICD-10-CM | POA: Insufficient documentation

## 2016-04-26 DIAGNOSIS — K029 Dental caries, unspecified: Secondary | ICD-10-CM | POA: Insufficient documentation

## 2016-04-26 DIAGNOSIS — Z87891 Personal history of nicotine dependence: Secondary | ICD-10-CM | POA: Insufficient documentation

## 2016-04-26 DIAGNOSIS — K0889 Other specified disorders of teeth and supporting structures: Secondary | ICD-10-CM

## 2016-04-26 MED ORDER — KETOROLAC TROMETHAMINE 60 MG/2ML IM SOLN
60.0000 mg | Freq: Once | INTRAMUSCULAR | Status: AC
  Administered 2016-04-26: 60 mg via INTRAMUSCULAR
  Filled 2016-04-26: qty 2

## 2016-04-26 MED ORDER — AMOXICILLIN 500 MG PO CAPS
500.0000 mg | ORAL_CAPSULE | Freq: Once | ORAL | Status: AC
  Administered 2016-04-26: 500 mg via ORAL
  Filled 2016-04-26: qty 1

## 2016-04-26 MED ORDER — HYDROCODONE-ACETAMINOPHEN 5-325 MG PO TABS: 1.0000 | ORAL_TABLET | Freq: Four times a day (QID) | ORAL | 0 refills | Status: DC | PRN

## 2016-04-26 MED ORDER — AMOXICILLIN 500 MG PO CAPS: 500.0000 mg | ORAL_CAPSULE | Freq: Three times a day (TID) | ORAL | 0 refills | Status: DC

## 2016-04-26 NOTE — ED Provider Notes (Signed)
[facility]-EMERGENCY DEPT Provider Note   CSN: [id number] Arrival date & time: 04/26/16  0156    History   Chief Complaint Chief Complaint  Patient presents with  . Dental Pain  . Otalgia    HPI Carla Rollins is a 20 y.o. female.  20 year old female with a history of asthma and chlamydia presents to the emergency department for evaluation of dental pain. Patient states that she has had similar pain over the past few months. Pain worsened this evening and was unrelieved by over-the-counter NSAIDs. Patient states that she feels pain to her right ear. She has had no difficulty opening her jaw or any recent fevers. No drainage in the mouth or hearing changes. Patient denies recent trauma. She states that she has tried follow-up with the dentist, but cannot find a provider that will take her Medicaid.   The history is provided by the patient. No language interpreter was used.    Past Medical History:  Diagnosis Date  . Asthma   . Chlamydia 2013  . Infection     Patient Active Problem List   Diagnosis Date Noted  . Vaginal delivery 05/07/2014  . Post-dates pregnancy 05/04/2014  . Marijuana abuse 10/17/2013  . Supervision of normal pregnancy in first trimester 10/09/2013  . OVERWEIGHT 07/27/2009  . 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 ECZEMA 07/27/2009    Past Surgical History:  Procedure Laterality Date  . NO PAST SURGERIES      OB History    Gravida Para Term Preterm AB Living   1 1 1  0 0 1   SAB TAB Ectopic Multiple Live Births   0 0 0 0 1       Home Medications    Prior to Admission medications   Medication Sig Start Date End Date Taking? Authorizing Provider  acetaminophen (TYLENOL) 500 MG tablet Take 1,500 mg by mouth every 6 (six) hours as needed for mild pain.    Historical Provider, MD  amoxicillin (AMOXIL) 500 MG capsule Take 1 capsule (500 mg total) by mouth 3 (three) times daily. 04/26/16   Antony MaduraKelly Gerhardt Gleed, PA-C  cyclobenzaprine (FLEXERIL) 5 MG tablet Take  1 tablet (5 mg total) by mouth 3 (three) times daily as needed for muscle spasms. Patient not taking: Reported on 10/24/2014 04/23/14   Deirdre Colin Mulders Poe, CNM  etonogestrel (NEXPLANON) 68 MG IMPL implant 1 each by Subdermal route once. Nov. 18, 2015    Historical Provider, MD  HYDROcodone-acetaminophen (NORCO/VICODIN) 5-325 MG tablet Take 1-2 tablets by mouth every 6 (six) hours as needed for severe pain. 04/26/16   Antony MaduraKelly Oluwatosin Bracy, PA-C  ibuprofen (ADVIL,MOTRIN) 800 MG tablet Take 1 tablet (800 mg total) by mouth 3 (three) times daily. 06/16/15   Tatyana Kirichenko, PA-C  ondansetron (ZOFRAN) 4 MG tablet Take 1 tablet (4 mg total) by mouth every 6 (six) hours as needed for nausea or vomiting. 10/24/14   Jon GillsZach Webb, MD    Family History Family History  Problem Relation Age of Onset  . Diabetes Maternal Grandmother   . Cancer Maternal Grandfather     Social History Social History  Substance Use Topics  . Smoking status: Former Smoker    Types: Cigarettes  . Smokeless tobacco: Never Used  . Alcohol use No     Allergies   Review of patient's allergies indicates no known allergies.   Review of Systems Review of Systems  Constitutional: Negative for fever.  HENT: Positive for dental problem. Negative for facial swelling and trouble swallowing.  Gastrointestinal: Negative for vomiting.  Musculoskeletal: Negative for neck stiffness.  Ten systems reviewed and are negative for acute change, except as noted in the HPI.     Physical Exam Updated Vital Signs BP 129/67 (BP Location: Right Arm)   Pulse 77   Temp 98.7 F (37.1 C) (Oral)   Resp 18   Ht 5\' 7"  (1.702 m)   Wt 81.6 kg   LMP 04/19/2016 (Approximate)   SpO2 99%   BMI 28.19 kg/m   Physical Exam  Constitutional: She is oriented to person, place, and time. She appears well-developed and well-nourished. No distress.  HENT:  Head: Normocephalic and atraumatic.  Right Ear: External ear normal.  Left Ear: External ear normal.    Mouth/Throat: Oropharynx is clear and moist.  Oropharynx clear. Uvula midline. Evidence of old dental caries and fillings. Mild dental decay. No trismus or tripoding. Oral floor is soft. Patient tolerating secretions without difficulty. No distinct swelling or fluctuance to the gingiva.  Eyes: Conjunctivae and EOM are normal. No scleral icterus.  Neck: Normal range of motion.  No nuchal rigidity or meningismus  Pulmonary/Chest: Effort normal. No respiratory distress.  Respirations even and unlabored  Musculoskeletal: Normal range of motion.  Neurological: She is alert and oriented to person, place, and time.  Skin: Skin is warm and dry. No rash noted. She is not diaphoretic. No erythema. No pallor.  Psychiatric: She has a normal mood and affect. Her behavior is normal.  Nursing note and vitals reviewed.    ED Treatments / Results  Labs (all labs ordered are listed, but only abnormal results are displayed) Labs Reviewed - No data to display  EKG  EKG Interpretation None       Radiology No results found.  Procedures Procedures (including critical care time)  Medications Ordered in ED Medications  ketorolac (TORADOL) injection 60 mg (60 mg Intramuscular Given 04/26/16 0546)  amoxicillin (AMOXIL) capsule 500 mg (500 mg Oral Given 04/26/16 0546)     Initial Impression / Assessment and Plan / ED Course  I have reviewed the triage vital signs and the nursing notes.  Pertinent labs & imaging results that were available during my care of the patient were reviewed by me and considered in my medical decision making (see chart for details).  Clinical Course    Patient with mouth pain x months, worsening tonight. No gross abscess. Exam unconcerning for Ludwig's angina or spread of infection. Will treat with Amoxicillin and pain medicine. Urged patient to follow-up with dentist. Referral and resource guide provided. Patient discharged in satisfactory condition.   Final Clinical  Impressions(s) / ED Diagnoses   Final diagnoses:  Pain, dental    New Prescriptions Discharge Medication List as of 04/26/2016  5:44 AM    START taking these medications   Details  amoxicillin (AMOXIL) 500 MG capsule Take 1 capsule (500 mg total) by mouth 3 (three) times daily., Starting Wed 04/26/2016, Print    HYDROcodone-acetaminophen (NORCO/VICODIN) 5-325 MG tablet Take 1-2 tablets by mouth every 6 (six) hours as needed for severe pain., Starting Wed 04/26/2016, Print         MarshallvilleKelly Marvina Danner, PA-C 04/26/16 [id number]    Gilda Creasehristopher J Pollina, MD 04/28/16 [phone]

## 2016-04-26 NOTE — ED Triage Notes (Signed)
Pt. reports chronic bilateral lower molars pain for several months unrelieved by OTC pain medications , pt. added right earache onset yesterday , denies injury , no drainage or hearing loss.

## 2018-03-16 ENCOUNTER — Encounter (HOSPITAL_COMMUNITY): Payer: Self-pay

## 2018-03-16 ENCOUNTER — Emergency Department (HOSPITAL_COMMUNITY)
Admission: EM | Admit: 2018-03-16 | Discharge: 2018-03-16 | Disposition: A | Discharge status: Home / Self Care | Payer: Self-pay | Attending: Emergency Medicine | Admitting: Emergency Medicine

## 2018-03-16 ENCOUNTER — Other Ambulatory Visit: Payer: Self-pay

## 2018-03-16 DIAGNOSIS — Z87891 Personal history of nicotine dependence: Secondary | ICD-10-CM | POA: Insufficient documentation

## 2018-03-16 DIAGNOSIS — R21 Rash and other nonspecific skin eruption: Secondary | ICD-10-CM | POA: Insufficient documentation

## 2018-03-16 DIAGNOSIS — J45909 Unspecified asthma, uncomplicated: Secondary | ICD-10-CM | POA: Insufficient documentation

## 2018-03-16 MED ORDER — TRIAMCINOLONE ACETONIDE 0.1 % EX CREA: 1.0000 "application " | TOPICAL_CREAM | Freq: Two times a day (BID) | CUTANEOUS | 1 refills | Status: DC

## 2018-03-16 MED ORDER — IBUPROFEN 800 MG PO TABS: 800.0000 mg | ORAL_TABLET | Freq: Four times a day (QID) | ORAL | 0 refills | Status: DC | PRN

## 2018-03-16 NOTE — ED Triage Notes (Signed)
Pt presents for evaluation of rash to bilateral hands worsening over past 6 months. Pt reports was seen at [facility] last month for same and given oral steroids. States rash improved at that time but got worse on Monday. States rash is itching and only on hands.

## 2018-03-16 NOTE — ED Notes (Signed)
ED Provider at bedside. 

## 2018-03-16 NOTE — Discharge Instructions (Addendum)
Return here as needed.  [facility] to attempt to set up an appointment.  Tell them you were here and that we would like you seen as soon as possible.

## 2018-03-16 NOTE — ED Provider Notes (Signed)
[facility] EMERGENCY DEPARTMENT Provider Note   CSN: [id number] Arrival date & time: 03/16/18  1434     History   Chief Complaint Chief Complaint  Patient presents with  . Rash    HPI Carla Rollins is a 22 y.o. female.  HPI Patient presents to the emergency department with rash to the bilateral hands.  The patient states she was seen at [facility] last month for similar symptoms.  Given oral steroids which seemed to improve the area somewhat.  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  She states she does wash her hands a lot as well she states the rash is nowhere else other than the tops of her hands and fingers.  She does not have any rash on the palms or soles of her hands or feet.  Patient has no fevers, nausea, vomiting, sore throat, or syncope. Past Medical History:  Diagnosis Date  . Asthma   . Chlamydia 2013  . Infection     Patient Active Problem List   Diagnosis Date Noted  . Vaginal delivery 05/07/2014  . Post-dates pregnancy 05/04/2014  . Marijuana abuse 10/17/2013  . Supervision of normal pregnancy in first trimester 10/09/2013  . OVERWEIGHT 07/27/2009  . 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 ECZEMA 07/27/2009    Past Surgical History:  Procedure Laterality Date  . NO PAST SURGERIES       OB History    Gravida  1   Para  1   Term  1   Preterm  0   AB  0   Living  1     SAB  0   TAB  0   Ectopic  0   Multiple  0   Live Births  1            Home Medications    Prior to Admission medications   Medication Sig Start Date End Date Taking? Authorizing Provider  acetaminophen (TYLENOL) 500 MG tablet Take 1,500 mg by mouth every 6 (six) hours as needed for mild pain.    [provider]  amoxicillin (AMOXIL) 500 MG capsule Take 1 capsule (500 mg total) by mouth 3 (three) times daily. 04/26/16   Antony Madura, PA-C  cyclobenzaprine (FLEXERIL)  5 MG tablet Take 1 tablet (5 mg total) by mouth 3 (three) times daily as needed for muscle spasms. Patient not taking: Reported on 10/24/2014 04/23/14   Danae Orleans, CNM  etonogestrel (NEXPLANON) 68 MG IMPL implant 1 each by Subdermal route once. Nov. 18, 2015    [provider]  HYDROcodone-acetaminophen (NORCO/VICODIN) 5-325 MG tablet Take 1-2 tablets by mouth every 6 (six) hours as needed for severe pain. 04/26/16   Antony Madura, PA-C  ibuprofen (ADVIL,MOTRIN) 800 MG tablet Take 1 tablet (800 mg total) by mouth 3 (three) times daily. 06/16/15   Kirichenko, Tatyana, PA-C  ondansetron (ZOFRAN) 4 MG tablet Take 1 tablet (4 mg total) by mouth every 6 (six) hours as needed for nausea or vomiting. 10/24/14   Jon Gills, MD    Family History Family History  Problem Relation Age of Onset  . Diabetes Maternal Grandmother   . Cancer Maternal Grandfather     Social History Social History   Tobacco Use  . Smoking status: Former Smoker    Types: Cigarettes  . Smokeless tobacco: Never Used  Substance Use Topics  . Alcohol use: No  .  Drug use: No     Allergies   Patient has no known allergies.   Review of Systems Review of Systems All other systems negative except as documented in the HPI. All pertinent positives and negatives as reviewed in the HPI.  Physical Exam Updated Vital Signs BP 137/72 (BP Location: Right Arm)   Pulse (!) 101   Temp 98.9 F (37.2 C) (Oral)   Resp 16   LMP 02/03/2018 (Approximate)   SpO2 100%   Physical Exam  Constitutional: She is oriented to person, place, and time. She appears well-developed and well-nourished. No distress.  HENT:  Head: Normocephalic and atraumatic.  Eyes: Pupils are equal, round, and reactive to light.  Pulmonary/Chest: Effort normal.  Neurological: She is alert and oriented to person, place, and time.  Skin: Skin is warm and dry. Rash noted. Rash is maculopapular. There is erythema.  Psychiatric: She has a normal mood  and affect.  Nursing note and vitals reviewed.    ED Treatments / Results  Labs (all labs ordered are listed, but only abnormal results are displayed) Labs Reviewed - No data to display  EKG None  Radiology No results found.  Procedures Procedures (including critical care time)  Medications Ordered in ED Medications - No data to display   Initial Impression / Assessment and Plan / ED Course  I have reviewed the triage vital signs and the nursing notes.  Pertinent labs & imaging results that were available during my care of the patient were reviewed by me and considered in my medical decision making (see chart for details).    I had 1 of the residents who will be doing a fellowship in dermatology and residents a look at this as well.  She agreed that this looks like a contact dermatitis type scenario along with an irritant eczema type scenario.  Patient is advised we will give her steroid cream and then she will also need to use white cotton gloves to keep the cream against her skin.  She is also advised to avoid excessive handwashing.  She is also going to need to avoid using those chemicals in the soapy water for quite a while.  Patient is advised to follow-up with creams per dermatology.  Told to return here as needed.  Patient agrees to the plan and all questions were answered.  Final Clinical Impressions(s) / ED Diagnoses   Final diagnoses:  None    ED Discharge Orders    None       Charlestine NightLawyer, Courtlyn Aki, Cordelia Poche-C 03/17/18 1544    Gerhard MunchLockwood, Robert, MD 03/18/18 0006

## 2018-08-25 ENCOUNTER — Other Ambulatory Visit: Payer: Self-pay

## 2018-08-25 ENCOUNTER — Emergency Department (HOSPITAL_COMMUNITY)
Admission: EM | Admit: 2018-08-25 | Discharge: 2018-08-25 | Disposition: A | Discharge status: Home / Self Care | Payer: Self-pay | Attending: Emergency Medicine | Admitting: Emergency Medicine

## 2018-08-25 ENCOUNTER — Emergency Department (HOSPITAL_COMMUNITY): Payer: Self-pay

## 2018-08-25 ENCOUNTER — Encounter (HOSPITAL_COMMUNITY): Payer: Self-pay | Admitting: Emergency Medicine

## 2018-08-25 DIAGNOSIS — R05 Cough: Secondary | ICD-10-CM | POA: Insufficient documentation

## 2018-08-25 DIAGNOSIS — R112 Nausea with vomiting, unspecified: Secondary | ICD-10-CM | POA: Insufficient documentation

## 2018-08-25 DIAGNOSIS — Z79899 Other long term (current) drug therapy: Secondary | ICD-10-CM | POA: Insufficient documentation

## 2018-08-25 DIAGNOSIS — R059 Cough, unspecified: Secondary | ICD-10-CM

## 2018-08-25 DIAGNOSIS — J45909 Unspecified asthma, uncomplicated: Secondary | ICD-10-CM | POA: Insufficient documentation

## 2018-08-25 DIAGNOSIS — R1114 Bilious vomiting: Secondary | ICD-10-CM

## 2018-08-25 DIAGNOSIS — F17210 Nicotine dependence, cigarettes, uncomplicated: Secondary | ICD-10-CM | POA: Insufficient documentation

## 2018-08-25 LAB — ETHANOL: Alcohol, Ethyl (B): <10 mg/dL (ref <10)

## 2018-08-25 LAB — COMPREHENSIVE METABOLIC PANEL
ALBUMIN: 3.8 g/dL (ref 3.5–5.0)
ALT: 14 U/L (ref 0–44)
AST: 20 U/L (ref 15–41)
Alkaline Phosphatase: 42 U/L (ref 38–126)
Anion gap: 10 (ref 5–15)
BUN: 8 mg/dL (ref 6–20)
CO2: 21 mmol/L — AB (ref 22–32)
Calcium: 9.3 mg/dL (ref 8.9–10.3)
Chloride: 106 mmol/L (ref 98–111)
Creatinine, Ser: 0.83 mg/dL (ref 0.44–1.00)
GFR calc non Af Amer: >60 mL/min (ref >60)
GLUCOSE: 111 mg/dL — AB (ref 70–99)
POTASSIUM: 3.2 mmol/L — AB (ref 3.5–5.1)
SODIUM: 137 mmol/L (ref 135–145)
TOTAL PROTEIN: 7.1 g/dL (ref 6.5–8.1)
Total Bilirubin: 0.4 mg/dL (ref 0.3–1.2)

## 2018-08-25 LAB — URINALYSIS, MICROSCOPIC (REFLEX)

## 2018-08-25 LAB — CBC WITH DIFFERENTIAL/PLATELET
Abs Immature Granulocytes: 0.02 10*3/uL (ref 0.00–0.07)
Basophils Absolute: 0 10*3/uL (ref 0.0–0.1)
Basophils Relative: 0 %
EOS PCT: 2 %
Eosinophils Absolute: 0.2 10*3/uL (ref 0.0–0.5)
HCT: 37.4 % (ref 36.0–46.0)
Hemoglobin: 12.1 g/dL (ref 12.0–15.0)
Immature Granulocytes: 0 %
Lymphocytes Relative: 46 %
Lymphs Abs: 4.4 10*3/uL — ABNORMAL HIGH (ref 0.7–4.0)
MCH: 28.9 pg (ref 26.0–34.0)
MCHC: 32.4 g/dL (ref 30.0–36.0)
MCV: 89.3 fL (ref 80.0–100.0)
MONO ABS: 0.5 10*3/uL (ref 0.1–1.0)
Monocytes Relative: 5 %
NEUTROS ABS: 4.5 10*3/uL (ref 1.7–7.7)
NRBC: 0 % (ref 0.0–0.2)
Neutrophils Relative %: 47 %
Platelets: 287 10*3/uL (ref 150–400)
RBC: 4.19 MIL/uL (ref 3.87–5.11)
RDW: 12.7 % (ref 11.5–15.5)
WBC: 9.8 10*3/uL (ref 4.0–10.5)

## 2018-08-25 LAB — URINALYSIS, ROUTINE W REFLEX MICROSCOPIC
BILIRUBIN URINE: NEGATIVE
Glucose, UA: NEGATIVE mg/dL
Ketones, ur: NEGATIVE mg/dL
Leukocytes, UA: NEGATIVE
Nitrite: NEGATIVE
PROTEIN: NEGATIVE mg/dL
Specific Gravity, Urine: 1.025 (ref 1.005–1.030)
pH: 6 (ref 5.0–8.0)

## 2018-08-25 LAB — INFLUENZA PANEL BY PCR (TYPE A & B)
INFLAPCR: NEGATIVE
Influenza B By PCR: NEGATIVE

## 2018-08-25 LAB — I-STAT BETA HCG BLOOD, ED (MC, WL, AP ONLY): I-stat hCG, quantitative: <5 m[IU]/mL (ref <5)

## 2018-08-25 LAB — LIPASE, BLOOD: Lipase: 27 U/L (ref 11–51)

## 2018-08-25 MED ORDER — ONDANSETRON 4 MG PO TBDP: 4.0000 mg | ORAL_TABLET | Freq: Three times a day (TID) | ORAL | 0 refills | Status: DC | PRN

## 2018-08-25 MED ORDER — SODIUM CHLORIDE 0.9 % IV BOLUS
1000.0000 mL | Freq: Once | INTRAVENOUS | Status: AC
  Administered 2018-08-25: 1000 mL via INTRAVENOUS

## 2018-08-25 NOTE — ED Notes (Signed)
Patient is alert and orientedx4.  Patient was explained discharge instructions and they understood them with no questions.  The patient's friend, Gevena Cottonthea Poole is taking patient home.

## 2018-08-25 NOTE — Discharge Instructions (Signed)
As discussed, your evaluation today has been largely reassuring.  But, it is important that you monitor your condition carefully, and do not hesitate to return to the ED if you develop new, or concerning changes in your condition. ? ?Otherwise, please follow-up with your physician for appropriate ongoing care. ? ?

## 2018-08-25 NOTE — ED Notes (Signed)
Pt back from X-ray.  

## 2018-08-25 NOTE — ED Provider Notes (Signed)
[facility] EMERGENCY DEPARTMENT Provider Note   CSN: [id number] Arrival date & time: 08/25/18  1921     History   Chief Complaint Chief Complaint  Patient presents with  . Emesis  . Nausea  . Cough    HPI Carla Rollins is a 22 y.o. female.  HPI Patient presents with concern of nausea, vomiting, cough. Cough began about 1 week ago. Since onset cough is been persistent, with minimal changes by using OTC medication. She notes that over the past 2 days, in addition to the cough she has had nausea, vomiting, and frequency of bowel movements, without diarrhea. No fever that she knows about. Prior to the onset of vomiting she recalls eating at a restaurant, having some suspicion for contamination of the seafood she ingested. She is here with a female companion who assists with the HPI. Last menstrual period was 1 week ago. Patient has no pain, belly or chest.  Past Medical History:  Diagnosis Date  . Asthma   . Chlamydia 2013  . Infection     Patient Active Problem List   Diagnosis Date Noted  . Vaginal delivery 05/07/2014  . Post-dates pregnancy 05/04/2014  . Marijuana abuse 10/17/2013  . Supervision of normal pregnancy in first trimester 10/09/2013  . OVERWEIGHT 07/27/2009  . 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 ECZEMA 07/27/2009    Past Surgical History:  Procedure Laterality Date  . NO PAST SURGERIES       OB History    Gravida  1   Para  1   Term  1   Preterm  0   AB  0   Living  1     SAB  0   TAB  0   Ectopic  0   Multiple  0   Live Births  1            Home Medications    Prior to Admission medications   Medication Sig Start Date End Date Taking? Authorizing Provider  acetaminophen (TYLENOL) 500 MG tablet Take 1,500 mg by mouth every 6 (six) hours as needed for mild pain.   Yes [provider]  etonogestrel (NEXPLANON) 68 MG IMPL implant 1 each by Subdermal route once. Nov. 18, 2015   Yes  [provider]  amoxicillin (AMOXIL) 500 MG capsule Take 1 capsule (500 mg total) by mouth 3 (three) times daily. Patient not taking: Reported on 08/25/2018 04/26/16   Antony Madura, PA-C  cyclobenzaprine (FLEXERIL) 5 MG tablet Take 1 tablet (5 mg total) by mouth 3 (three) times daily as needed for muscle spasms. Patient not taking: Reported on 10/24/2014 04/23/14   Danae Orleans, CNM  HYDROcodone-acetaminophen (NORCO/VICODIN) 5-325 MG tablet Take 1-2 tablets by mouth every 6 (six) hours as needed for severe pain. Patient not taking: Reported on 08/25/2018 04/26/16   Antony Madura, PA-C  ibuprofen (ADVIL,MOTRIN) 800 MG tablet Take 1 tablet (800 mg total) by mouth every 6 (six) hours as needed. Patient not taking: Reported on 08/25/2018 03/16/18   Charlestine Night, PA-C  ondansetron (ZOFRAN) 4 MG tablet Take 1 tablet (4 mg total) by mouth every 6 (six) hours as needed for nausea or vomiting. Patient not taking: Reported on 08/25/2018 10/24/14   Jon Gills, MD  triamcinolone cream (KENALOG) 0.1 % Apply 1 application topically 2 (two) times daily. Patient not taking: Reported on 08/25/2018 03/16/18   Charlestine Night, PA-C    Family History Family History  Problem Relation Age of Onset  .  Diabetes Maternal Grandmother   . Cancer Maternal Grandfather     Social History Social History   Tobacco Use  . Smoking status: Current Every Day Smoker    Types: Cigarettes  . Smokeless tobacco: Never Used  Substance Use Topics  . Alcohol use: No  . Drug use: No     Allergies   Patient has no known allergies.   Review of Systems Review of Systems  Constitutional:       Per HPI, otherwise negative  HENT:       Per HPI, otherwise negative  Respiratory:       Per HPI, otherwise negative  Cardiovascular:       Per HPI, otherwise negative  Gastrointestinal: Positive for nausea and vomiting. Negative for abdominal pain.  Endocrine:       Negative aside from HPI  Genitourinary:        Neg aside from HPI   Musculoskeletal:       Per HPI, otherwise negative  Skin: Negative.   Neurological: Negative for syncope.     Physical Exam Updated Vital Signs BP (!) 104/57   Pulse 74   Temp 98.3 F (36.8 C) (Oral)   Resp 17   Ht 5\' 7"  (1.702 m)   Wt 81.6 kg   LMP 08/04/2018 (Exact Date)   SpO2 100%   BMI 28.19 kg/m   Physical Exam Vitals signs and nursing note reviewed.  Constitutional:      General: She is not in acute distress.    Appearance: She is well-developed.  HENT:     Head: Normocephalic and atraumatic.  Eyes:     Conjunctiva/sclera: Conjunctivae normal.  Cardiovascular:     Rate and Rhythm: Normal rate and regular rhythm.  Pulmonary:     Effort: Pulmonary effort is normal. No respiratory distress.     Breath sounds: Normal breath sounds. No stridor.  Abdominal:     General: There is no distension.     Tenderness: There is no abdominal tenderness. There is no guarding.  Skin:    General: Skin is warm and dry.  Neurological:     Mental Status: She is alert and oriented to person, place, and time.     Cranial Nerves: No cranial nerve deficit.      ED Treatments / Results  Labs (all labs ordered are listed, but only abnormal results are displayed) Labs Reviewed  COMPREHENSIVE METABOLIC PANEL - Abnormal; Notable for the following components:      Result Value   Potassium 3.2 (*)    CO2 21 (*)    Glucose, Bld 111 (*)    All other components within normal limits  CBC WITH DIFFERENTIAL/PLATELET - Abnormal; Notable for the following components:   Lymphs Abs 4.4 (*)    All other components within normal limits  URINALYSIS, ROUTINE W REFLEX MICROSCOPIC - Abnormal; Notable for the following components:   Hgb urine dipstick TRACE (*)    All other components within normal limits  URINALYSIS, MICROSCOPIC (REFLEX) - Abnormal; Notable for the following components:   Bacteria, UA FEW (*)    All other components within normal limits  LIPASE, BLOOD   ETHANOL  INFLUENZA PANEL BY PCR (TYPE A & B)  I-STAT BETA HCG BLOOD, ED ([facility], [facility], [facility] ONLY)    EKG None  Radiology Dg Chest 2 View  Result Date: 08/25/2018 CLINICAL DATA:  Acute onset of cough and vomiting. Generalized abdominal pain. EXAM: CHEST - 2 VIEW COMPARISON:  Chest radiograph performed  06/23/2013 FINDINGS: The lungs are well-aerated and clear. There is no evidence of focal opacification, pleural effusion or pneumothorax. The heart is normal in size; the mediastinal contour is within normal limits. No acute osseous abnormalities are seen. IMPRESSION: No acute cardiopulmonary process seen. Electronically Signed   By: Roanna RaiderJeffery  Chang M.D.   On: 08/25/2018 22:29    Procedures Procedures (including critical care time)  Medications Ordered in ED Medications  sodium chloride 0.9 % bolus 1,000 mL (has no administration in time range)     Initial Impression / Assessment and Plan / ED Course  I have reviewed the triage vital signs and the nursing notes.  Pertinent labs & imaging results that were available during my care of the patient were reviewed by me and considered in my medical decision making (see chart for details).     10:43 PM Patient awake and alert with no new complaints per Discussed all findings including reassuring labs, x-ray, influenza test I again suggested the patient stop smoking. She remains afebrile, without new complaints, and given her acknowledgment of improved nausea, there is some suspicion for influenza-like illness, though cough with food reaction is another consideration. With reassuring findings, vitals, labs, the patient was discharged to follow-up with her primary care physician.  Final Clinical Impressions(s) / ED Diagnoses  Nausea and vomiting Cough   Gerhard MunchLockwood, Rayden Scheper, MD 08/25/18 2245

## 2018-08-25 NOTE — ED Triage Notes (Signed)
Patient with cough the last few days, she states that she went out to eat and started vomiting afterwards.  She states that the cough went away, now with the nausea and vomiting.  Patient has abdominal pain when vomiting.

## 2018-08-25 NOTE — ED Notes (Signed)
Family at bedside. 

## 2018-11-13 ENCOUNTER — Emergency Department (HOSPITAL_COMMUNITY)
Admission: EM | Admit: 2018-11-13 | Discharge: 2018-11-13 | Disposition: A | Discharge status: Home / Self Care | Payer: Self-pay | Attending: Emergency Medicine | Admitting: Emergency Medicine

## 2018-11-13 ENCOUNTER — Other Ambulatory Visit: Payer: Self-pay

## 2018-11-13 DIAGNOSIS — J45909 Unspecified asthma, uncomplicated: Secondary | ICD-10-CM | POA: Insufficient documentation

## 2018-11-13 DIAGNOSIS — R51 Headache: Secondary | ICD-10-CM | POA: Insufficient documentation

## 2018-11-13 DIAGNOSIS — F17210 Nicotine dependence, cigarettes, uncomplicated: Secondary | ICD-10-CM | POA: Insufficient documentation

## 2018-11-13 DIAGNOSIS — R0981 Nasal congestion: Secondary | ICD-10-CM | POA: Insufficient documentation

## 2018-11-13 MED ORDER — PSEUDOEPHEDRINE HCL 60 MG PO TABS: 60.0000 mg | ORAL_TABLET | Freq: Three times a day (TID) | ORAL | 0 refills | Status: DC | PRN

## 2018-11-13 NOTE — ED Notes (Signed)
Patient verbalizes understanding of discharge instructions. Opportunity for questioning and answers were provided. Armband removed by staff, pt discharged from ED.  

## 2018-11-13 NOTE — ED Provider Notes (Signed)
[facility] Emergency Department Provider Note MRN:  [id number]  Arrival date & time: 11/13/18     Chief Complaint   Headache and Nasal Congestion   History of Present Illness   Carla Rollins is a 23 y.o. year-old female with a history of asthma presenting to the ED with chief complaint of nasal congestion.  2 days of runny nose followed by nasal congestion, trouble breathing out of the nose, impacting her sleep.  This morning with dull frontal headache and pressure sensation.  Denies cough, no sore throat, no chest pain or shortness of breath, no abdominal pain, no fever, no body aches.  Symptoms are constant, not repleted by Alka-Seltzer at home.  Review of Systems  A complete 10 system review of systems was obtained and all systems are negative except as noted in the HPI and PMH.   Patient's Health History    Past Medical History:  Diagnosis Date  . Asthma   . Chlamydia 2013  . Infection     Past Surgical History:  Procedure Laterality Date  . NO PAST SURGERIES      Family History  Problem Relation Age of Onset  . Diabetes Maternal Grandmother   . Cancer Maternal Grandfather     Social History   Socioeconomic History  . Marital status: Single    Spouse name: Not on file  . Number of children: Not on file  . Years of education: Not on file  . Highest education level: Not on file  Occupational History  . Not on file  Social Needs  . Financial resource strain: Not on file  . Food insecurity:    Worry: Not on file    Inability: Not on file  . Transportation needs:    Medical: Not on file    Non-medical: Not on file  Tobacco Use  . Smoking status: Current Every Day Smoker    Types: Cigarettes  . Smokeless tobacco: Never Used  Substance and Sexual Activity  . Alcohol use: No  . Drug use: No  . Sexual activity: Yes    Birth control/protection: None    Comment: preg  Lifestyle  . Physical activity:    Days per week: Not on file    Minutes  per session: Not on file  . Stress: Not on file  Relationships  . Social connections:    Talks on phone: Not on file    Gets together: Not on file    Attends religious service: Not on file    Active member of club or organization: Not on file    Attends meetings of clubs or organizations: Not on file    Relationship status: Not on file  . Intimate partner violence:    Fear of current or ex partner: Not on file    Emotionally abused: Not on file    Physically abused: Not on file    Forced sexual activity: Not on file  Other Topics Concern  . Not on file  Social History Narrative  . Not on file     Physical Exam  Vital Signs and Nursing Notes reviewed Vitals:   11/13/18 1317  BP: 126/67  Pulse: 96  Resp: 16  Temp: 98.3 F (36.8 C)  SpO2: 98%    CONSTITUTIONAL: Well-appearing, NAD NEURO:  Alert and oriented x 3, no focal deficits EYES:  eyes equal and reactive; mild tenderness palpation to the maxillary sinuses bilaterally ENT/NECK:  no LAD, no JVD CARDIO: Regular rate, well-perfused, normal S1  and S2 PULM:  CTAB no wheezing or rhonchi GI/GU:  normal bowel sounds, non-distended, non-tender MSK/SPINE:  No gross deformities, no edema SKIN:  no rash, atraumatic PSYCH:  Appropriate speech and behavior  Diagnostic and Interventional Summary    EKG Interpretation  Date/Time:    Ventricular Rate:    PR Interval:    QRS Duration:   QT Interval:    QTC Calculation:   R Axis:     Text Interpretation:        Labs Reviewed - No data to display  No orders to display    Medications - No data to display   Procedures Critical Care  ED Course and Medical Decision Making  I have reviewed the triage vital signs and the nursing notes.  Pertinent labs & imaging results that were available during my care of the patient were reviewed by me and considered in my medical decision making (see below for details).  Consistent with viral infection causing nasal congestion, low  duration of illness, lack of fever negates the need for antibiotics at this time.  No meningismus, lungs clear.  Doubt flu.  After the discussed management above, the patient was determined to be safe for discharge.  The patient was in agreement with this plan and all questions regarding their care were answered.  ED return precautions were discussed and the patient will return to the ED with any significant worsening of condition.  Elmer Sow. Pilar Plate, MD [facility] Emergency Medicine [facility] [email or web address]  Final Clinical Impressions(s) / ED Diagnoses     ICD-10-CM   1. Nasal congestion R09.81     ED Discharge Orders         Ordered    pseudoephedrine (SUDAFED) 60 MG tablet  Every 8 hours PRN     11/13/18 1335             Sabas Sous, MD 11/13/18 1337

## 2018-11-13 NOTE — ED Triage Notes (Signed)
Pt c/o stuffy nose and headache x3 days. Pt denies n/v/d. Pt is alert and oriented x4. Took OTC PO meds for cold yesterday without change in sx.

## 2018-11-13 NOTE — Discharge Instructions (Addendum)
You were evaluated in the Emergency Department and after careful evaluation, we did not find any emergent condition requiring admission or further testing in the hospital.  Your symptoms today seem to be due to nasal congestion and a sinus headache due to a virus.  Please return to the Emergency Department if you experience any worsening of your condition.  We encourage you to follow up with a primary care provider.  Thank you for allowing [place] to be a part of your care.

## 2018-11-16 ENCOUNTER — Emergency Department (HOSPITAL_COMMUNITY)
Admission: EM | Admit: 2018-11-16 | Discharge: 2018-11-16 | Disposition: A | Discharge status: Home / Self Care | Payer: Self-pay | Attending: Emergency Medicine | Admitting: Emergency Medicine

## 2018-11-16 ENCOUNTER — Encounter (HOSPITAL_COMMUNITY): Payer: Self-pay | Admitting: Emergency Medicine

## 2018-11-16 DIAGNOSIS — J45909 Unspecified asthma, uncomplicated: Secondary | ICD-10-CM | POA: Insufficient documentation

## 2018-11-16 DIAGNOSIS — J069 Acute upper respiratory infection, unspecified: Secondary | ICD-10-CM | POA: Insufficient documentation

## 2018-11-16 DIAGNOSIS — F17210 Nicotine dependence, cigarettes, uncomplicated: Secondary | ICD-10-CM | POA: Insufficient documentation

## 2018-11-16 MED ORDER — BENZONATATE 100 MG PO CAPS: 100.0000 mg | ORAL_CAPSULE | Freq: Three times a day (TID) | ORAL | 0 refills | Status: DC

## 2018-11-16 MED ORDER — GUAIFENESIN 200 MG PO TABS: 200.0000 mg | ORAL_TABLET | ORAL | 0 refills | Status: DC | PRN

## 2018-11-16 NOTE — ED Notes (Signed)
Bed: WA07 Expected date:  Expected time:  Means of arrival:  Comments: 

## 2018-11-16 NOTE — ED Provider Notes (Signed)
[facility]-EMERGENCY DEPT Provider Note   CSN: [id number] Arrival date & time: 11/16/18  2633    History   Chief Complaint Chief Complaint  Patient presents with  . Nasal Congestion  . Cough    HPI Carla Rollins is a 23 y.o. female.     The history is provided by the patient and medical records. No language interpreter was used.  Cough  Associated symptoms: rhinorrhea   Associated symptoms: no fever and no sore throat      23 year old female presenting to the ED with cold symptoms.  For the past 4 days patient has had nasal congestion, runny nose, throat irritation, dry cough, sneezing.  She was seen in the ED 2 days ago for symptoms and was diagnosed with a URI, prescribed Sudafed.  She mentioned medication has not helped much and last night she was having difficulties sleeping as her nose was congested.  She does not complain of any fever or body aches.  She denies any recent travel or recent sick contact.  She denies any significant shortness of breath, nausea vomiting or diarrhea.  She denies any itchy eyes  Past Medical History:  Diagnosis Date  . Asthma   . Chlamydia 2013  . Infection     Patient Active Problem List   Diagnosis Date Noted  . Vaginal delivery 05/07/2014  . Post-dates pregnancy 05/04/2014  . Marijuana abuse 10/17/2013  . Supervision of normal pregnancy in first trimester 10/09/2013  . OVERWEIGHT 07/27/2009  . 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 ECZEMA 07/27/2009    Past Surgical History:  Procedure Laterality Date  . NO PAST SURGERIES       OB History    Gravida  1   Para  1   Term  1   Preterm  0   AB  0   Living  1     SAB  0   TAB  0   Ectopic  0   Multiple  0   Live Births  1            Home Medications    Prior to Admission medications   Medication Sig Start Date End Date Taking? Authorizing Provider  acetaminophen (TYLENOL) 500 MG tablet Take 1,500 mg by mouth every 6 (six)  hours as needed for mild pain.    [provider]  amoxicillin (AMOXIL) 500 MG capsule Take 1 capsule (500 mg total) by mouth 3 (three) times daily. Patient not taking: Reported on 08/25/2018 04/26/16   Antony Madura, PA-C  cyclobenzaprine (FLEXERIL) 5 MG tablet Take 1 tablet (5 mg total) by mouth 3 (three) times daily as needed for muscle spasms. Patient not taking: Reported on 10/24/2014 04/23/14   Danae Orleans, CNM  etonogestrel (NEXPLANON) 68 MG IMPL implant 1 each by Subdermal route once. Nov. 18, 2015    [provider]  HYDROcodone-acetaminophen (NORCO/VICODIN) 5-325 MG tablet Take 1-2 tablets by mouth every 6 (six) hours as needed for severe pain. Patient not taking: Reported on 08/25/2018 04/26/16   Antony Madura, PA-C  ibuprofen (ADVIL,MOTRIN) 800 MG tablet Take 1 tablet (800 mg total) by mouth every 6 (six) hours as needed. Patient not taking: Reported on 08/25/2018 03/16/18   Charlestine Night, PA-C  ondansetron (ZOFRAN ODT) 4 MG disintegrating tablet Take 1 tablet (4 mg total) by mouth every 8 (eight) hours as needed for nausea or vomiting. 08/25/18   Gerhard Munch, MD  ondansetron (ZOFRAN) 4 MG tablet Take 1  tablet (4 mg total) by mouth every 6 (six) hours as needed for nausea or vomiting. Patient not taking: Reported on 08/25/2018 10/24/14   Jon Gills, MD  pseudoephedrine (SUDAFED) 60 MG tablet Take 1 tablet (60 mg total) by mouth every 8 (eight) hours as needed for congestion. 11/13/18   Sabas Sous, MD  triamcinolone cream (KENALOG) 0.1 % Apply 1 application topically 2 (two) times daily. Patient not taking: Reported on 08/25/2018 03/16/18   Charlestine Night, PA-C    Family History Family History  Problem Relation Age of Onset  . Diabetes Maternal Grandmother   . Cancer Maternal Grandfather     Social History Social History   Tobacco Use  . Smoking status: Current Every Day Smoker    Types: Cigarettes  . Smokeless tobacco: Never Used  Substance  Use Topics  . Alcohol use: No  . Drug use: No     Allergies   Patient has no known allergies.   Review of Systems Review of Systems  Constitutional: Negative for fever.  HENT: Positive for postnasal drip, rhinorrhea and sneezing. Negative for sinus pain, sore throat and trouble swallowing.   Respiratory: Positive for cough.      Physical Exam Updated Vital Signs BP (!) 148/85 (BP Location: Left Arm)   Pulse 100   Temp 98 F (36.7 C) (Oral)   Resp 15   Ht  (1.702 m)   Wt 81.6 kg   SpO2 99%   BMI 28.19 kg/m   Physical Exam Vitals signs and nursing note reviewed.  Constitutional:      General: She is not in acute distress.    Appearance: She is well-developed.  HENT:     Head: Atraumatic.     Comments: Ears: TMs normal bilaterally Nose: Rhinorrhea with sinus congestion Throat: Uvula midline no tonsillar enlargement or exudate Eyes:     Conjunctiva/sclera: Conjunctivae normal.  Neck:     Musculoskeletal: Neck supple.  Cardiovascular:     Rate and Rhythm: Normal rate and regular rhythm.     Pulses: Normal pulses.     Heart sounds: Normal heart sounds.  Pulmonary:     Effort: Pulmonary effort is normal.     Breath sounds: Normal breath sounds.  Abdominal:     Palpations: Abdomen is soft.  Skin:    Findings: No rash.  Neurological:     Mental Status: She is alert.      ED Treatments / Results  Labs (all labs ordered are listed, but only abnormal results are displayed) Labs Reviewed - No data to display  EKG None  Radiology No results found.  Procedures Procedures (including critical care time)  Medications Ordered in ED Medications - No data to display   Initial Impression / Assessment and Plan / ED Course  I have reviewed the triage vital signs and the nursing notes.  Pertinent labs & imaging results that were available during my care of the patient were reviewed by me and considered in my medical decision making (see chart for  details).       BP (!) 148/85 (BP Location: Left Arm)   Pulse 100   Temp 98 F (36.7 C) (Oral)   Resp 15   Ht  (1.702 m)   Wt 81.6 kg   SpO2 99%   BMI 28.19 kg/m    Final Clinical Impressions(s) / ED Diagnoses   Final diagnoses:  Acute URI    ED Discharge Orders  Ordered    guaiFENesin 200 MG tablet  Every 4 hours PRN     11/16/18 0821    benzonatate (TESSALON) 100 MG capsule  Every 8 hours     11/16/18 0821         7:43 AM Patient with symptoms suggestive of URI.  Doubt flu.  Doubt allergic reaction.  She is otherwise well-appearing, does have sinus congestion.  Will provide symptomatic treatment, reassurance given.  Patient made aware that URI symptoms tends to last for 1 to 2 weeks.   Fayrene Helper, PA-C 11/16/18 7169    Lorre Nick, MD 11/17/18 [phone]

## 2018-11-16 NOTE — ED Triage Notes (Signed)
Patient presents with nasal congestion and a slight cough. Patient seen at [facility] for same a few days ago. Patient tearful in triage stating she is scared to sleep because she feels like she can't breathe due to the nasal congestion. No SOB, endorses sore throat.

## 2019-03-19 ENCOUNTER — Ambulatory Visit (HOSPITAL_COMMUNITY)
Admission: EM | Admit: 2019-03-19 | Discharge: 2019-03-19 | Disposition: A | Discharge status: Home / Self Care | Payer: Self-pay | Attending: Urgent Care | Admitting: Urgent Care

## 2019-03-19 ENCOUNTER — Encounter (HOSPITAL_COMMUNITY): Payer: Self-pay

## 2019-03-19 ENCOUNTER — Other Ambulatory Visit: Payer: Self-pay

## 2019-03-19 DIAGNOSIS — N898 Other specified noninflammatory disorders of vagina: Secondary | ICD-10-CM | POA: Insufficient documentation

## 2019-03-19 DIAGNOSIS — Z7251 High risk heterosexual behavior: Secondary | ICD-10-CM | POA: Insufficient documentation

## 2019-03-19 DIAGNOSIS — Z3202 Encounter for pregnancy test, result negative: Secondary | ICD-10-CM

## 2019-03-19 DIAGNOSIS — N760 Acute vaginitis: Secondary | ICD-10-CM | POA: Insufficient documentation

## 2019-03-19 DIAGNOSIS — N3090 Cystitis, unspecified without hematuria: Secondary | ICD-10-CM | POA: Insufficient documentation

## 2019-03-19 LAB — POCT URINALYSIS DIP (DEVICE)
Glucose, UA: NEGATIVE mg/dL
Ketones, ur: NEGATIVE mg/dL
Nitrite: POSITIVE — AB
Protein, ur: 30 mg/dL — AB
Specific Gravity, Urine: >=1.03 (ref 1.005–1.030)
Urobilinogen, UA: 1 mg/dL (ref 0.0–1.0)
pH: 6 (ref 5.0–8.0)

## 2019-03-19 LAB — POCT PREGNANCY, URINE: Preg Test, Ur: NEGATIVE

## 2019-03-19 MED ORDER — NITROFURANTOIN MONOHYD MACRO 100 MG PO CAPS: 100.0000 mg | ORAL_CAPSULE | Freq: Two times a day (BID) | ORAL | 0 refills | Status: DC

## 2019-03-19 NOTE — ED Provider Notes (Addendum)
MRN: [id number] DOB: Jun 21, 1996  Subjective:   Carla Rollins is a 23 y.o. female presenting for 1 week history of persistent vaginal discharge now having moderate dysuria today.  Review of systems as below. LMP 03/05/2019, was regular.  Patient is sexually active, does not use condoms for protection.  She still has her Nexplanon and is overdue, plans on having it taken out soon.    No Known Allergies  Past Medical History:  Diagnosis Date  . Asthma   . Chlamydia 2013  . Infection      Past Surgical History:  Procedure Laterality Date  . NO PAST SURGERIES      Review of Systems  Constitutional: Negative for chills, fever and malaise/fatigue.  Respiratory: Negative for cough and shortness of breath.   Cardiovascular: Negative for chest pain.  Gastrointestinal: Positive for nausea. Negative for abdominal pain, constipation, diarrhea and vomiting.  Genitourinary: Positive for dysuria. Negative for flank pain, frequency, hematuria and urgency.  Musculoskeletal: Negative for back pain and myalgias.  Skin: Negative for rash.  Neurological: Negative for dizziness and headaches.  Psychiatric/Behavioral: Negative for substance abuse.    Objective:   Vitals: BP 118/64 (BP Location: Right Arm)   Pulse 77   Temp 99.1 F (37.3 C) (Oral)   Resp 16   Wt 190 lb (86.2 kg)   LMP 03/05/2019   SpO2 100%   BMI 29.76 kg/m   Physical Exam Constitutional:      General: She is not in acute distress.    Appearance: Normal appearance. She is well-developed and normal weight. She is not ill-appearing, toxic-appearing or diaphoretic.  HENT:     Head: Normocephalic and atraumatic.     Right Ear: External ear normal.     Left Ear: External ear normal.     Nose: Nose normal.     Mouth/Throat:     Mouth: Mucous membranes are moist.     Pharynx: Oropharynx is clear.  Eyes:     General: No scleral icterus.    Extraocular Movements: Extraocular movements intact.     Pupils: Pupils are equal,  round, and reactive to light.  Cardiovascular:     Rate and Rhythm: Normal rate and regular rhythm.     Heart sounds: Normal heart sounds. No murmur. No friction rub. No gallop.   Pulmonary:     Effort: Pulmonary effort is normal. No respiratory distress.     Breath sounds: Normal breath sounds. No stridor. No wheezing, rhonchi or rales.  Abdominal:     General: Bowel sounds are normal. There is no distension.     Palpations: Abdomen is soft. There is no mass.     Tenderness: There is no abdominal tenderness. There is no right CVA tenderness, left CVA tenderness, guarding or rebound.  Skin:    General: Skin is warm and dry.     Coloration: Skin is not pale.     Findings: No rash.  Neurological:     General: No focal deficit present.     Mental Status: She is alert and oriented to person, place, and time.  Psychiatric:        Mood and Affect: Mood normal.        Behavior: Behavior normal.        Thought Content: Thought content normal.        Judgment: Judgment normal.     Results for orders placed or performed during the hospital encounter of 03/19/19 (from the past 24 hour(s))  POCT urinalysis dip (  device)     Status: Abnormal   Collection Time: 03/19/19  3:21 PM  Result Value Ref Range   Glucose, UA NEGATIVE NEGATIVE mg/dL   Bilirubin Urine SMALL (A) NEGATIVE   Ketones, ur NEGATIVE NEGATIVE mg/dL   Specific Gravity, Urine >=1.030 1.005 - 1.030   Hgb urine dipstick TRACE (A) NEGATIVE   pH 6.0 5.0 - 8.0   Protein, ur 30 (A) NEGATIVE mg/dL   Urobilinogen, UA 1.0 0.0 - 1.0 mg/dL   Nitrite POSITIVE (A) NEGATIVE   Leukocytes,Ua TRACE (A) NEGATIVE  Pregnancy, urine POC     Status: None   Collection Time: 03/19/19  3:40 PM  Result Value Ref Range   Preg Test, Ur NEGATIVE NEGATIVE    Assessment and Plan :   1. Vaginal discharge   2. Acute vaginitis   3. Unprotected sex   4. Cystitis     Urinalysis is consistent with cystitis but patient has concerns about STI and wants  to be treated.  Labs pending.  I was agreeable to treating for cystitis and as per CDC guidelines gonorrhea, chlamydia with IM ceftriaxone and azithromycin in clinic. Counseled patient on potential for adverse effects with medications prescribed/recommended today, ER and return-to-clinic precautions discussed, patient verbalized understanding.     Wallis BambergMani, Carmelina Balducci, PA-C 03/19/19 1556

## 2019-03-19 NOTE — ED Triage Notes (Signed)
Pt states she has a vaginal discharge x 1 week. Pt states it burns when she voids.

## 2019-03-20 LAB — CERVICOVAGINAL ANCILLARY ONLY
Bacterial vaginitis: POSITIVE — AB
Candida vaginitis: POSITIVE — AB
Chlamydia: NEGATIVE
Neisseria Gonorrhea: NEGATIVE
Trichomonas: POSITIVE — AB

## 2019-03-21 LAB — URINE CULTURE: Culture: >=100000 — AB

## 2019-03-25 ENCOUNTER — Encounter (HOSPITAL_COMMUNITY): Payer: Self-pay

## 2019-03-25 ENCOUNTER — Telehealth (HOSPITAL_COMMUNITY): Payer: Self-pay | Admitting: Emergency Medicine

## 2019-03-25 MED ORDER — METRONIDAZOLE 500 MG PO TABS: 500.0000 mg | ORAL_TABLET | Freq: Two times a day (BID) | ORAL | 0 refills | Status: AC

## 2019-03-25 MED ORDER — FLUCONAZOLE 150 MG PO TABS: 150.0000 mg | ORAL_TABLET | Freq: Once | ORAL | 0 refills | Status: AC

## 2019-03-25 NOTE — Telephone Encounter (Signed)
Urine culture was positive for e coli  and was given macrobid  at urgent care visit.   Bacterial vaginosis is positive. This was not treated at the urgent care visit.  Flagyl 500 mg BID x 7 days #14 no refills sent to patients pharmacy of choice.    Test for candida (yeast) was positive.  Prescription for fluconazole 150mg  po now, repeat dose in 3d if needed, #2 no refills, sent to the pharmacy of record.  Recheck or followup with PCP for further evaluation if symptoms are not improving.    Trichomonas is positive. Rx  for Flagyl 2 grams, once was sent to the pharmacy of record. Pt needs education to refrain from sexual intercourse for 7 days to give the medicine time to work. Sexual partners need to be notified and tested/treated. Condoms may reduce risk of reinfection. Recheck for further evaluation if symptoms are not improving.   Attempted to reach patient. Mother answered and tried to get information about patient. Unsure if patient will call back. Sending Mychart note.

## 2019-03-25 NOTE — Telephone Encounter (Signed)
For unknown reasons, labs auto canceled. Instructed patient to return for redraw of labs. Will not charge her.

## 2019-03-27 NOTE — Telephone Encounter (Signed)
Pt did not return for redraw of labs.

## 2019-09-06 ENCOUNTER — Other Ambulatory Visit: Payer: Self-pay

## 2019-09-06 ENCOUNTER — Encounter (HOSPITAL_COMMUNITY): Payer: Self-pay | Admitting: Emergency Medicine

## 2019-09-06 ENCOUNTER — Emergency Department (HOSPITAL_COMMUNITY)
Admission: EM | Admit: 2019-09-06 | Discharge: 2019-09-06 | Disposition: A | Discharge status: Home / Self Care | Payer: Self-pay | Attending: Emergency Medicine | Admitting: Emergency Medicine

## 2019-09-06 DIAGNOSIS — Z79899 Other long term (current) drug therapy: Secondary | ICD-10-CM | POA: Insufficient documentation

## 2019-09-06 DIAGNOSIS — F17210 Nicotine dependence, cigarettes, uncomplicated: Secondary | ICD-10-CM | POA: Insufficient documentation

## 2019-09-06 DIAGNOSIS — U071 COVID-19: Secondary | ICD-10-CM | POA: Insufficient documentation

## 2019-09-06 DIAGNOSIS — J45909 Unspecified asthma, uncomplicated: Secondary | ICD-10-CM | POA: Insufficient documentation

## 2019-09-06 DIAGNOSIS — Z793 Long term (current) use of hormonal contraceptives: Secondary | ICD-10-CM | POA: Insufficient documentation

## 2019-09-06 LAB — POC SARS CORONAVIRUS 2 AG -  ED: SARS Coronavirus 2 Ag: POSITIVE — AB

## 2019-09-06 NOTE — ED Triage Notes (Signed)
C/o loss of smell x 1 week.  Pt did have body aches that resolved.  Requesting COVID testing.

## 2019-09-06 NOTE — ED Triage Notes (Signed)
PT reports one week of aches , cough and loss taste Carla Rollins

## 2019-09-06 NOTE — ED Provider Notes (Signed)
[facility] EMERGENCY DEPARTMENT Provider Note   CSN: [id number] Arrival date & time: 09/06/19  1512     History Chief Complaint  Patient presents with  . COVID testing  . Loss of Smell    Carla Rollins is a 24 y.o. female.  Patient presents to the emergency department with complaint of loss of smell and body aches.  She states that her symptoms started about 6 days ago.  Body aches mainly in her back.  She has had occasional cough but no trouble breathing or shortness of breath.  No chest tightness.  She has not had any fevers, nausea or vomiting.  No history of pulmonary disease.  She is taken some NyQuil at home.  She presents today for testing for COVID-19.        Past Medical History:  Diagnosis Date  . Asthma   . Chlamydia 2013  . Infection     Patient Active Problem List   Diagnosis Date Noted  . Vaginal delivery 05/07/2014  . Post-dates pregnancy 05/04/2014  . Marijuana abuse 10/17/2013  . Supervision of normal pregnancy in first trimester 10/09/2013  . OVERWEIGHT 07/27/2009  . 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 ECZEMA 07/27/2009    Past Surgical History:  Procedure Laterality Date  . NO PAST SURGERIES       OB History    Gravida  1   Para  1   Term  1   Preterm  0   AB  0   Living  1     SAB  0   TAB  0   Ectopic  0   Multiple  0   Live Births  1           Family History  Problem Relation Age of Onset  . Diabetes Maternal Grandmother   . Cancer Maternal Grandfather     Social History   Tobacco Use  . Smoking status: Current Every Day Smoker    Types: Cigarettes  . Smokeless tobacco: Never Used  Substance Use Topics  . Alcohol use: No  . Drug use: No    Home Medications Prior to Admission medications   Medication Sig Start Date End Date Taking? Authorizing Provider  etonogestrel (NEXPLANON) 68 MG IMPL implant 1 each by Subdermal route once. Nov. 18, 2015    [provider]   nitrofurantoin, macrocrystal-monohydrate, (MACROBID) 100 MG capsule Take 1 capsule (100 mg total) by mouth 2 (two) times daily. 03/19/19   Wallis Bamberg, PA-C    Allergies    Patient has no known allergies.  Review of Systems   Review of Systems  Constitutional: Negative for chills and fever.  HENT: Negative for rhinorrhea and sore throat.        + Loss of taste and smell  Eyes: Negative for redness.  Respiratory: Positive for cough. Negative for chest tightness and shortness of breath.   Cardiovascular: Negative for chest pain.  Gastrointestinal: Negative for abdominal pain, diarrhea, nausea and vomiting.  Genitourinary: Negative for dysuria.  Musculoskeletal: Positive for myalgias.  Skin: Negative for rash.  Neurological: Negative for headaches.    Physical Exam Updated Vital Signs BP 124/84   Pulse 85   Temp 98.1 F (36.7 C) (Oral)   Resp 16   LMP 08/23/2019   SpO2 95%   Physical Exam Vitals and nursing note reviewed.  Constitutional:      Appearance: She is well-developed.  HENT:     Head: Normocephalic and atraumatic.  Eyes:     Conjunctiva/sclera: Conjunctivae normal.  Pulmonary:     Effort: No respiratory distress.  Musculoskeletal:     Cervical back: Normal range of motion and neck supple.  Skin:    General: Skin is warm and dry.  Neurological:     Mental Status: She is alert.     ED Results / Procedures / Treatments   Labs (all labs ordered are listed, but only abnormal results are displayed) Labs Reviewed  POC SARS CORONAVIRUS 2 AG -  ED - Abnormal; Notable for the following components:      Result Value   SARS Coronavirus 2 Ag POSITIVE (*)    All other components within normal limits    EKG None  Radiology No results found.  Procedures Procedures (including critical care time)  Medications Ordered in ED Medications - No data to display  ED Course  I have reviewed the triage vital signs and the nursing notes.  Pertinent labs & imaging  results that were available during my care of the patient were reviewed by me and considered in my medical decision making (see chart for details).  Patient seen and examined. Work-up initiated. Medications ordered.   Vital signs reviewed and are as follows: BP 124/84   Pulse 85   Temp 98.1 F (36.7 C) (Oral)   Resp 16   LMP 08/23/2019   SpO2 95%   Detailed discussion had with with patient regarding COVID-19 precautions and written instructions given as well.  We discussed need to isolate themselves for 10 days from onset of symptoms and have 24 hours of improvement prior to breaking isolation.  We discussed signs symptoms to return which include worsening shortness of breath, trouble breathing, or increased work of breathing.  Also return with persistent vomiting, confusion, passing out, or if they have any other concerns. Counseled on the need for rest and good hydration. Discussed that high-risk contacts should be aware of positive result and they need to quarantine and be tested if they develop any symptoms. Patient verbalizes understanding.   We spent some time discussing contact with her small child at home.  I encouraged her to follow-up with her pediatrician for any testing or isolation recommendations.     MDM Rules/Calculators/A&P                      Patient with positive Covid testing, body aches and dysgeusia and anosmia.  She does not have any fevers.  She does not have any respiratory issues.  Vital signs are normal.  No indication for further evaluation or intervention at this time.  Isolation instructions as above.    Final Clinical Impression(s) / ED Diagnoses Final diagnoses:  [id number]    Rx / DC Orders ED Discharge Orders    None       Carlisle Cater, PA-C 09/06/19 1633    Lucrezia Starch, MD 09/06/19 2342

## 2019-09-06 NOTE — Discharge Instructions (Signed)
Please read and follow all provided instructions.  Your diagnoses today include:  1. COVID-19     Tests performed today include:  COVID test - POSITIVE  Vital signs. See below for your results today.   Medications prescribed:  Please use over-the-counter NSAID medications (ibuprofen, naproxen) as directed on the packaging for pain.   Take any prescribed medications only as directed.  Home care instructions:  Follow any educational materials contained in this packet.  BE VERY CAREFUL not to take multiple medicines containing Tylenol (also called acetaminophen). Doing so can lead to an overdose which can damage your liver and cause liver failure and possibly death.   Follow-up instructions: You need to isolate yourself for at least 10 days after the first day of your symptoms.  You should not break isolation until you have had improvement for at least 24 hours.  Return instructions:   Please return to the Emergency Department if you experience worsening symptoms.   Please return if you have any other emergent concerns.  Additional Information:  Your vital signs today were: LMP 08/23/2019  If your blood pressure (BP) was elevated above 135/85 this visit, please have this repeated by your doctor within one month. --------------

## 2019-09-06 NOTE — ED Notes (Signed)
Patient verbalizes understanding of discharge instructions . Opportunity for questions and answers were provided . Armband removed by staff ,Pt discharged from ED. W/C  offered at D/C  and Declined W/C at D/C and was escorted to lobby by RN.  

## 2019-10-06 ENCOUNTER — Other Ambulatory Visit: Payer: Self-pay

## 2020-03-19 ENCOUNTER — Encounter (HOSPITAL_COMMUNITY): Payer: Self-pay | Admitting: Emergency Medicine

## 2020-03-19 ENCOUNTER — Emergency Department (HOSPITAL_COMMUNITY)
Admission: EM | Admit: 2020-03-19 | Discharge: 2020-03-19 | Disposition: A | Discharge status: Home / Self Care | Payer: Self-pay | Attending: Emergency Medicine | Admitting: Emergency Medicine

## 2020-03-19 ENCOUNTER — Other Ambulatory Visit: Payer: Self-pay

## 2020-03-19 DIAGNOSIS — K0889 Other specified disorders of teeth and supporting structures: Secondary | ICD-10-CM | POA: Insufficient documentation

## 2020-03-19 DIAGNOSIS — F17210 Nicotine dependence, cigarettes, uncomplicated: Secondary | ICD-10-CM | POA: Insufficient documentation

## 2020-03-19 DIAGNOSIS — J45909 Unspecified asthma, uncomplicated: Secondary | ICD-10-CM | POA: Insufficient documentation

## 2020-03-19 DIAGNOSIS — Z79899 Other long term (current) drug therapy: Secondary | ICD-10-CM | POA: Insufficient documentation

## 2020-03-19 MED ORDER — PENICILLIN V POTASSIUM 250 MG PO TABS
500.0000 mg | ORAL_TABLET | Freq: Once | ORAL | Status: AC
  Administered 2020-03-19: 500 mg via ORAL
  Filled 2020-03-19: qty 2

## 2020-03-19 MED ORDER — KETOROLAC TROMETHAMINE 30 MG/ML IJ SOLN
30.0000 mg | Freq: Once | INTRAMUSCULAR | Status: AC
  Administered 2020-03-19: 30 mg via INTRAMUSCULAR
  Filled 2020-03-19: qty 1

## 2020-03-19 MED ORDER — PENICILLIN V POTASSIUM 500 MG PO TABS: 500.0000 mg | ORAL_TABLET | Freq: Four times a day (QID) | ORAL | 0 refills | Status: DC

## 2020-03-19 NOTE — ED Provider Notes (Signed)
[facility] EMERGENCY DEPARTMENT Provider Note   CSN: [id number] Arrival date & time: 03/19/20  0250     History   Chief Complaint Chief Complaint  Patient presents with  . Dental Pain    HPI Carla Rollins is a 24 y.o. female.  Patient presents to the emergency department with a dental complaint. Symptoms began yesterday. The patient has tried to alleviate pain with OTC meds.  Pain rated as severe, characterized as throbbing in nature and located right lower molars. Patient denies fever, night sweats, chills, difficulty swallowing or opening mouth, SOB, nuchal rigidity or decreased ROM of neck.  Patient does not have a dentist and requests a resource guide at discharge.      HPI  Past Medical History:  Diagnosis Date  . Asthma   . Chlamydia 2013  . Infection     Patient Active Problem List   Diagnosis Date Noted  . Vaginal delivery 05/07/2014  . Post-dates pregnancy 05/04/2014  . Marijuana abuse 10/17/2013  . Supervision of normal pregnancy in first trimester 10/09/2013  . OVERWEIGHT 07/27/2009  . 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 ECZEMA 07/27/2009    Past Surgical History:  Procedure Laterality Date  . NO PAST SURGERIES       OB History    Gravida  1   Para  1   Term  1   Preterm  0   AB  0   Living  1     SAB  0   TAB  0   Ectopic  0   Multiple  0   Live Births  1            Home Medications    Prior to Admission medications   Medication Sig Start Date End Date Taking? Authorizing Provider  etonogestrel (NEXPLANON) 68 MG IMPL implant 1 each by Subdermal route once. Nov. 18, 2015    [provider]  nitrofurantoin, macrocrystal-monohydrate, (MACROBID) 100 MG capsule Take 1 capsule (100 mg total) by mouth 2 (two) times daily. 03/19/19   Wallis Bamberg, PA-C    Family History Family History  Problem Relation Age of Onset  . Diabetes Maternal Grandmother   . Cancer Maternal Grandfather     Social  History Social History   Tobacco Use  . Smoking status: Current Every Day Smoker    Types: Cigarettes  . Smokeless tobacco: Never Used  Substance Use Topics  . Alcohol use: No  . Drug use: No     Allergies   Patient has no known allergies.   Review of Systems Review of Systems  Constitutional: Negative for chills and fever.  HENT: Positive for dental problem. Negative for drooling.   Neurological: Negative for speech difficulty.  Psychiatric/Behavioral: Positive for sleep disturbance.     Physical Exam Updated Vital Signs BP 129/71 (BP Location: Left Arm)   Pulse 77   Temp 97.6 F (36.4 C) (Oral)   Resp (!) 22   SpO2 100%   Physical Exam Physical Exam  Constitutional: Pt appears well-developed and well-nourished.  HENT:  Head: Normocephalic.  Right Ear: Tympanic membrane, external ear and ear canal normal.  Left Ear: Tympanic membrane, external ear and ear canal normal.  Nose: Nose normal. Right sinus exhibits no maxillary sinus tenderness and no frontal sinus tenderness. Left sinus exhibits no maxillary sinus tenderness and no frontal sinus tenderness.  Mouth/Throat: Uvula is midline, oropharynx is clear and moist and mucous membranes are normal. No oral lesions. No  uvula swelling or lacerations. No oropharyngeal exudate, posterior oropharyngeal edema, posterior oropharyngeal erythema or tonsillar abscesses.  Poor dentition No gingival swelling, fluctuance or induration No gross abscess  No sublingual edema, tenderness to palpation, or sign of Ludwig's angina, or deep space infection Pain at tooth number 31 Eyes: Conjunctivae are normal. Pupils are equal, round, and reactive to light. Right eye exhibits no discharge. Left eye exhibits no discharge.  Neck: Normal range of motion. Neck supple.  No stridor Handling secretions without difficulty No nuchal rigidity No cervical lymphadenopathy Cardiovascular: Normal rate, regular rhythm and normal heart sounds.     Pulmonary/Chest: Effort normal. No respiratory distress.  Equal chest rise  Abdominal: Soft. Bowel sounds are normal. Pt exhibits no distension. There is no tenderness.  Lymphadenopathy: Pt has no cervical adenopathy.  Neurological: Pt is alert and oriented x 4  Skin: Skin is warm and dry.  Psychiatric: Pt has a normal mood and affect.  Nursing note and vitals reviewed.   ED Treatments / Results  Labs (all labs ordered are listed, but only abnormal results are displayed) Labs Reviewed - No data to display  EKG    Radiology No results found.  Procedures Procedures (including critical care time)  Medications Ordered in ED Medications  ketorolac (TORADOL) 30 MG/ML injection 30 mg (has no administration in time range)  penicillin v potassium (VEETID) tablet 500 mg (has no administration in time range)     Initial Impression / Assessment and Plan / ED Course  I have reviewed the triage vital signs and the nursing notes.  Pertinent labs & imaging results that were available during my care of the patient were reviewed by me and considered in my medical decision making (see chart for details).        Patient with dentalgia.  No abscess requiring immediate incision and drainage.  Exam not concerning for Ludwig's angina or pharyngeal abscess.  Will treat with IM toradol in Ed and penicillin. Pt instructed to follow-up with dentist.  Discussed return precautions. Pt safe for discharge.   Final Clinical Impressions(s) / ED Diagnoses   Final diagnoses:  Pain, dental    ED Discharge Orders         Ordered    penicillin v potassium (VEETID) 500 MG tablet  4 times daily     Discontinue  Reprint     03/19/20 0350           Roxy Horseman, PA-C 03/19/20 0351    Mesner, Barbara Cower, MD 03/19/20 [phone]

## 2020-03-19 NOTE — ED Triage Notes (Signed)
Patient with dental pain that has gotten worse over the last day.  Patient states that she took tylenol and ibuprofen around 10p last night.

## 2020-09-11 ENCOUNTER — Telehealth: Payer: Self-pay | Admitting: Orthopedic Surgery

## 2020-09-11 DIAGNOSIS — N760 Acute vaginitis: Secondary | ICD-10-CM

## 2020-09-11 NOTE — Progress Notes (Signed)
Based on what you shared with me, I feel your condition warrants further evaluation and I recommend that you be seen for a face to face visit.  Please contact your primary care physician practice to be seen. Many offices offer virtual options to be seen via video if you are not comfortable going in person to a medical facility at this time.  If you do not have a PCP, [facility] offers a free physician referral service available at [phone]. Our trained staff has the experience, knowledge and resources to put you in touch with a physician who is right for you.   You also have the option of a video visit through [email or web address]  If you are having a true medical emergency please call 911.  NOTE: If you entered your credit card information for this eVisit, you will not be charged. You may see a "hold" on your card for the $35 but that hold will drop off and you will not have a charge processed.  Your e-visit answers were reviewed by a board certified advanced clinical practitioner to complete your personal care plan.  Thank you for using e-Visits.  Greater than 5 minutes, yet less than 10 minutes of time have been spent researching, coordinating and implementing care for this patient today.

## 2021-01-05 ENCOUNTER — Emergency Department (HOSPITAL_COMMUNITY)
Admission: EM | Admit: 2021-01-05 | Discharge: 2021-01-05 | Discharge status: Against Medical Advice | Payer: Medicaid Other | Attending: Emergency Medicine | Admitting: Emergency Medicine

## 2021-01-05 ENCOUNTER — Other Ambulatory Visit: Payer: Self-pay

## 2021-01-05 DIAGNOSIS — K0889 Other specified disorders of teeth and supporting structures: Secondary | ICD-10-CM | POA: Diagnosis not present

## 2021-01-05 DIAGNOSIS — Z5321 Procedure and treatment not carried out due to patient leaving prior to being seen by health care provider: Secondary | ICD-10-CM | POA: Diagnosis not present

## 2021-01-05 NOTE — ED Triage Notes (Signed)
Pt reports L sided dental pain x 1-2 years. Today pain is worse than ever, causing pain in entire L side of head.

## 2021-01-05 NOTE — ED Notes (Signed)
Pt stated "she was leaving, she had to go to work and she thought she'd be seen by now". Moving Pt OTF.

## 2021-01-05 NOTE — ED Provider Notes (Signed)
  Emergency Medicine Provider Triage Evaluation Note  Carla Rollins 25 y.o. female was evaluated in triage.  Pt complains of dental pain that began today.  She reports having pain to the lower aspect of her left teeth.  She states that she took a Percocet at home which temporarily helped the pain but then states that it returned.  She does not have a dentist.  She has not had any trouble swallowing, difficulty breathing, vomiting, fevers.  Review of Systems  Positive: Dental pain Negative: SOB, fever   Physical Exam  BP 134/82   Pulse 70   Temp 98.2 F (36.8 C) (Oral)   Resp 18   Ht 5\' 4"  (1.626 m)   Wt 65.8 kg   SpO2 100%   BMI 24.89 kg/m  Gen:   Awake, no distress   HEENT:  Atraumatic. Face is symmetric in apperarance without any overlying warmth, erytehma. Posterior molar on left lower side is partially cracked  Resp:  Normal effort  Cardiac:  Normal rate  Neuro:  Speech clear   Medical Decision Making  Medically screening exam initiated at 5:30 PM  Appropriate orders placed.  Yaneth Fairbairn was informed that the remainder of the evaluation will be completed by another provider, this initial triage assessment does not replace that evaluation, and the importance of remaining in the ED until their evaluation is complete.  Clinical Impression  Dental pain   Portions of this note were generated with Dragon dictation software. Dictation errors may occur despite best attempts at proofreading.     [name], PA-C 01/05/21 1731    03/07/21, MD 01/05/21 2046

## 2021-01-10 ENCOUNTER — Encounter (HOSPITAL_COMMUNITY): Payer: Self-pay | Admitting: Emergency Medicine

## 2021-01-10 ENCOUNTER — Emergency Department (HOSPITAL_COMMUNITY)
Admission: EM | Admit: 2021-01-10 | Discharge: 2021-01-10 | Disposition: A | Discharge status: Home / Self Care | Payer: Medicaid Other | Attending: Emergency Medicine | Admitting: Emergency Medicine

## 2021-01-10 ENCOUNTER — Other Ambulatory Visit: Payer: Self-pay

## 2021-01-10 DIAGNOSIS — K0889 Other specified disorders of teeth and supporting structures: Secondary | ICD-10-CM | POA: Diagnosis present

## 2021-01-10 DIAGNOSIS — J45909 Unspecified asthma, uncomplicated: Secondary | ICD-10-CM | POA: Insufficient documentation

## 2021-01-10 DIAGNOSIS — F17210 Nicotine dependence, cigarettes, uncomplicated: Secondary | ICD-10-CM | POA: Insufficient documentation

## 2021-01-10 MED ORDER — NAPROXEN 500 MG PO TABS: 500.0000 mg | ORAL_TABLET | Freq: Two times a day (BID) | ORAL | 0 refills | Status: DC

## 2021-01-10 MED ORDER — NAPROXEN 250 MG PO TABS
500.0000 mg | ORAL_TABLET | Freq: Once | ORAL | Status: AC
  Administered 2021-01-10: 500 mg via ORAL
  Filled 2021-01-10: qty 2

## 2021-01-10 MED ORDER — PENICILLIN V POTASSIUM 250 MG PO TABS
500.0000 mg | ORAL_TABLET | Freq: Once | ORAL | Status: AC
  Administered 2021-01-10: 500 mg via ORAL
  Filled 2021-01-10: qty 2

## 2021-01-10 MED ORDER — PENICILLIN V POTASSIUM 500 MG PO TABS: 500.0000 mg | ORAL_TABLET | Freq: Four times a day (QID) | ORAL | 0 refills | Status: DC

## 2021-01-10 NOTE — Discharge Instructions (Addendum)
Take the prescribed medication as directed. Follow-up with dentist as soon as possible.  Can use dentist on call or I have attached a list of other local clinics. Return to the ED for new or worsening symptoms.

## 2021-01-10 NOTE — ED Notes (Signed)
Patient verbalizes understanding of discharge instructions. Opportunity for questioning and answers were provided. Armband removed by staff, pt discharged from ED ambulatory.   

## 2021-01-10 NOTE — ED Provider Notes (Signed)
[facility] EMERGENCY DEPARTMENT Provider Note   CSN: [id number] Arrival date & time: 01/10/21  0359     History Chief Complaint  Patient presents with  . Dental Pain    Carla Rollins is a 25 y.o. female.  The history is provided by the patient and medical records.  Dental Pain   25 y.o. F here with left upper dental pain.  States this has been ongoing for several months, waxing and waning in severity.  She denies fever, chills, sweats, facial swelling, or difficulty swallowing.  States she has been eating limited solid foods due to pain but has been trying to stay hydrated.  States pain now is throbbing throughout entire left side of her face.    Past Medical History:  Diagnosis Date  . Asthma   . Chlamydia 2013  . Infection     Patient Active Problem List   Diagnosis Date Noted  . Vaginal delivery 05/07/2014  . Post-dates pregnancy 05/04/2014  . Marijuana abuse 10/17/2013  . Supervision of normal pregnancy in first trimester 10/09/2013  . OVERWEIGHT 07/27/2009  . 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 ECZEMA 07/27/2009    Past Surgical History:  Procedure Laterality Date  . NO PAST SURGERIES       OB History    Gravida  1   Para  1   Term  1   Preterm  0   AB  0   Living  1     SAB  0   IAB  0   Ectopic  0   Multiple  0   Live Births  1           Family History  Problem Relation Age of Onset  . Diabetes Maternal Grandmother   . Cancer Maternal Grandfather     Social History   Tobacco Use  . Smoking status: Current Every Day Smoker    Types: Cigarettes  . Smokeless tobacco: Never Used  Substance Use Topics  . Alcohol use: No  . Drug use: No    Home Medications Prior to Admission medications   Medication Sig Start Date End Date Taking? Authorizing Provider  etonogestrel (NEXPLANON) 68 MG IMPL implant 1 each by Subdermal route once. Nov. 18, 2015    [provider]  nitrofurantoin,  macrocrystal-monohydrate, (MACROBID) 100 MG capsule Take 1 capsule (100 mg total) by mouth 2 (two) times daily. 03/19/19   Wallis Bamberg, PA-C  penicillin v potassium (VEETID) 500 MG tablet Take 1 tablet (500 mg total) by mouth 4 (four) times daily. 03/19/20   Roxy Horseman, PA-C    Allergies    Patient has no known allergies.  Review of Systems   Review of Systems  HENT: Positive for dental problem.   All other systems reviewed and are negative.   Physical Exam Updated Vital Signs BP 125/79 (BP Location: Left Arm)   Pulse 85   Temp 98.8 F (37.1 C) (Oral)   Resp 20   LMP 12/16/2020 (Approximate)   SpO2 100%   Physical Exam Vitals and nursing note reviewed.  Constitutional:      Appearance: She is well-developed.  HENT:     Head: Normocephalic and atraumatic.     Mouth/Throat:     Comments: Teeth largely in fair dentition, some scattered decay noted, left upper molars are all TTP, there is cavity noted along anterior aspect of left upper first premolar, surrounding gingiva darkened in appearance consistent with gingivitis, handling secretions appropriately, no  trismus, no facial or neck swelling, normal phonation without stridor Eyes:     Conjunctiva/sclera: Conjunctivae normal.     Pupils: Pupils are equal, round, and reactive to light.  Cardiovascular:     Rate and Rhythm: Normal rate and regular rhythm.     Heart sounds: Normal heart sounds.  Pulmonary:     Effort: Pulmonary effort is normal.     Breath sounds: Normal breath sounds.  Abdominal:     General: Bowel sounds are normal.     Palpations: Abdomen is soft.  Musculoskeletal:        General: Normal range of motion.     Cervical back: Normal range of motion.  Skin:    General: Skin is warm and dry.  Neurological:     Mental Status: She is alert and oriented to person, place, and time.     ED Results / Procedures / Treatments   Labs (all labs ordered are listed, but only abnormal results are  displayed) Labs Reviewed - No data to display  EKG None  Radiology No results found.  Procedures Procedures   Medications Ordered in ED Medications  penicillin v potassium (VEETID) tablet 500 mg (500 mg Oral Given 01/10/21 0506)  naproxen (NAPROSYN) tablet 500 mg (500 mg Oral Given 01/10/21 0998)    ED Course  I have reviewed the triage vital signs and the nursing notes.  Pertinent labs & imaging results that were available during my care of the patient were reviewed by me and considered in my medical decision making (see chart for details).    MDM Rules/Calculators/A&P  25 year old female who with left upper dental pain.  Somewhat chronic in nature but waxing and waning over the past several months.  She is afebrile and nontoxic in appearance here.  Does have signs of dental decay and gingivitis but there is no visible abscess or drainable fluid collection.  No signs or symptoms concerning for Ludwig's angina or deep space infection of the neck.  Will start on prophylactic antibiotics and referred to dentist for follow-up.  She may return here for any new concerns.  Final Clinical Impression(s) / ED Diagnoses Final diagnoses:  Pain, dental    Rx / DC Orders ED Discharge Orders         Ordered    penicillin v potassium (VEETID) 500 MG tablet  4 times daily        01/10/21 0550    naproxen (NAPROSYN) 500 MG tablet  2 times daily with meals        01/10/21 0550           Garlon Hatchet, PA-C 01/10/21 3382    Marily Memos, MD 01/11/21 [phone]

## 2021-01-10 NOTE — ED Triage Notes (Signed)
Patient reports chrnoic left upper/lower molar pain for several months with headache and left ear ache .

## 2023-03-10 ENCOUNTER — Other Ambulatory Visit: Payer: Self-pay

## 2023-03-10 ENCOUNTER — Emergency Department (HOSPITAL_COMMUNITY)
Admission: EM | Admit: 2023-03-10 | Discharge: 2023-03-10 | Disposition: A | Discharge status: Home / Self Care | Payer: Medicaid Other | Attending: Emergency Medicine | Admitting: Emergency Medicine

## 2023-03-10 ENCOUNTER — Emergency Department (HOSPITAL_COMMUNITY): Payer: Medicaid Other

## 2023-03-10 DIAGNOSIS — S0181XA Laceration without foreign body of other part of head, initial encounter: Secondary | ICD-10-CM

## 2023-03-10 DIAGNOSIS — Z23 Encounter for immunization: Secondary | ICD-10-CM | POA: Diagnosis not present

## 2023-03-10 DIAGNOSIS — S0990XA Unspecified injury of head, initial encounter: Secondary | ICD-10-CM | POA: Diagnosis present

## 2023-03-10 DIAGNOSIS — W25XXXA Contact with sharp glass, initial encounter: Secondary | ICD-10-CM | POA: Insufficient documentation

## 2023-03-10 MED ORDER — LIDOCAINE-EPINEPHRINE (PF) 2 %-1:200000 IJ SOLN
20.0000 mL | Freq: Once | INTRAMUSCULAR | Status: AC
  Administered 2023-03-10: 20 mL via INTRADERMAL
  Filled 2023-03-10: qty 20

## 2023-03-10 MED ORDER — TETANUS-DIPHTH-ACELL PERTUSSIS 5-2.5-18.5 LF-MCG/0.5 IM SUSY
0.5000 mL | PREFILLED_SYRINGE | Freq: Once | INTRAMUSCULAR | Status: AC
  Administered 2023-03-10: 0.5 mL via INTRAMUSCULAR
  Filled 2023-03-10: qty 0.5

## 2023-03-10 MED ORDER — KETOROLAC TROMETHAMINE 60 MG/2ML IM SOLN
60.0000 mg | Freq: Once | INTRAMUSCULAR | Status: AC
  Administered 2023-03-10: 60 mg via INTRAMUSCULAR
  Filled 2023-03-10: qty 2

## 2023-03-10 NOTE — Discharge Instructions (Addendum)
Sutured repair Keep the laceration site dry for the next 24 hours and leave the dressing in place. After 24 hours you may remove the dressing and gently clean the laceration site with antibacterial soap and warm water. Do not scrub the area. Do not soak the area and water for long periods of time. Don't use hydrogen peroxide, iodine-based solutions, or alcohol, which can slow healing, and will probably be painful! Apply topical bacitracin 1-2 times per day for the next 3-5 days.  Your sutures are absorbable and do not require removal.  You should return sooner for any signs of infection which would include increased redness around the wound, increased swelling, new drainage of yellow pus.
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
## 2023-03-10 NOTE — ED Notes (Signed)
Patient transported to X-ray 

## 2023-03-25 ENCOUNTER — Telehealth (HOSPITAL_COMMUNITY): Payer: Self-pay

## 2023-03-25 ENCOUNTER — Ambulatory Visit (HOSPITAL_COMMUNITY)
Admission: EM | Admit: 2023-03-25 | Discharge: 2023-03-25 | Disposition: A | Discharge status: Home / Self Care | Payer: Medicaid Other | Attending: Emergency Medicine | Admitting: Emergency Medicine

## 2023-03-25 ENCOUNTER — Encounter (HOSPITAL_COMMUNITY): Payer: Self-pay

## 2023-03-25 DIAGNOSIS — R21 Rash and other nonspecific skin eruption: Secondary | ICD-10-CM | POA: Diagnosis not present

## 2023-03-25 MED ORDER — PREDNISONE 10 MG (21) PO TBPK: ORAL_TABLET | Freq: Every day | ORAL | 0 refills | Status: DC

## 2023-03-25 MED ORDER — CEPHALEXIN 500 MG PO CAPS: 500.0000 mg | ORAL_CAPSULE | Freq: Two times a day (BID) | ORAL | 0 refills | Status: DC

## 2023-03-25 MED ORDER — CEPHALEXIN 500 MG PO CAPS: 500.0000 mg | ORAL_CAPSULE | Freq: Two times a day (BID) | ORAL | 0 refills | Status: AC

## 2023-03-25 NOTE — Telephone Encounter (Signed)
Patient calling in as the pharmacy the Patient requested was closed. Patient requesting medications be re-sent to the [facility] on [address].   Medication sent in to preferred pharmacy per protocol.

## 2023-03-25 NOTE — Discharge Instructions (Addendum)
Today you were evaluated for your rash, unknown cause will provide coverage for both infection and irritation to the skin for something that is coming contact with  Take cephalexin every morning and every evening for 5 days to treat infection  Begin prednisone every morning with food as directed to reduce inflammation  You may continue topical eczema cream as well as topical Benadryl cream as needed to help with itching  Rub and do not scratch as this can worsen symptoms  If you have any concerns regarding healing you may follow-up for reevaluation

## 2023-03-25 NOTE — ED Provider Notes (Signed)
[facility]    CSN: [id number] Arrival date & time: 03/25/23  1114      History   Chief Complaint Chief Complaint  Patient presents with   Rash    HPI Carla Rollins is a 27 y.o. female.   Patient presents for evaluation of a rash present to the forehead beginning 2 days ago.  Symptoms started abruptly, initially beginning the center of the forehead but has spread along the sides.  Rash is pruritic, denies drainage.  2 weeks ago had absorbable sutures placed along the left side of the forehead, denies drainage, redness or swelling to the site, occasionally experiencing pain, endorses physician noted that this is normal.  Has attempted use of over-the-counter eczema cream which has been ineffective.  Denies changes in toiletries, diet or recent travel.  Did go swimming in a public pool prior to symptoms beginning.  No other members of household or close contacts have similar symptoms.  Past Medical History:  Diagnosis Date   Asthma    Chlamydia 2013   Infection     Patient Active Problem List   Diagnosis Date Noted   Vaginal delivery 05/07/2014   Post-dates pregnancy 05/04/2014   Marijuana abuse 10/17/2013   Supervision of normal pregnancy in first trimester 10/09/2013   OVERWEIGHT 07/27/2009   DECREASED HEARING, LEFT EAR 07/27/2009   ECZEMA 07/27/2009    Past Surgical History:  Procedure Laterality Date   NO PAST SURGERIES      OB History     Gravida  1   Para  1   Term  1   Preterm  0   AB  0   Living  1      SAB  0   IAB  0   Ectopic  0   Multiple  0   Live Births  1            Home Medications    Prior to Admission medications   Medication Sig Start Date End Date Taking? Authorizing Provider  etonogestrel (NEXPLANON) 68 MG IMPL implant 1 each by Subdermal route once. Nov. 18, 2015    [provider]  naproxen (NAPROSYN) 500 MG tablet Take 1 tablet (500 mg total) by mouth 2 (two) times daily with a meal. 01/10/21    Garlon Hatchet, PA-C  nitrofurantoin, macrocrystal-monohydrate, (MACROBID) 100 MG capsule Take 1 capsule (100 mg total) by mouth 2 (two) times daily. 03/19/19   Wallis Bamberg, PA-C  penicillin v potassium (VEETID) 500 MG tablet Take 1 tablet (500 mg total) by mouth 4 (four) times daily. 01/10/21   Garlon Hatchet, PA-C    Family History Family History  Problem Relation Age of Onset   Diabetes Maternal Grandmother    Cancer Maternal Grandfather     Social History Social History   Tobacco Use   Smoking status: Every Day    Types: Cigarettes   Smokeless tobacco: Never  Substance Use Topics   Alcohol use: No   Drug use: No     Allergies   Patient has no known allergies.   Review of Systems Review of Systems  Skin:  Positive for rash.     Physical Exam Triage Vital Signs ED Triage Vitals [03/25/23 1143]  Encounter Vitals Group     BP 120/82     Systolic BP Percentile      Diastolic BP Percentile      Pulse Rate 63     Resp 18  Temp 98.1 F (36.7 C)     Temp Source Oral     SpO2 98 %     Weight      Height      Head Circumference      Peak Flow      Pain Score      Pain Loc      Pain Education      Exclude from Growth Chart    No data found.  Updated Vital Signs BP 120/82 (BP Location: Left Arm)   Pulse 63   Temp 98.1 F (36.7 C) (Oral)   Resp 18   LMP 03/14/2023 (Approximate)   SpO2 98%   Visual Acuity Right Eye Distance:   Left Eye Distance:   Bilateral Distance:    Right Eye Near:   Left Eye Near:    Bilateral Near:     Physical Exam Constitutional:      Appearance: Normal appearance.  Eyes:     Extraocular Movements: Extraocular movements intact.  Pulmonary:     Effort: Pulmonary effort is normal.  Skin:    Comments: Flesh tone papular rash with light centering present across the forehead extending down the sides into the cheeks, no erythema or drainage noted  Suture appears to be healed without signs of infection  Neurological:      Mental Status: She is alert and oriented to person, place, and time. Mental status is at baseline.      UC Treatments / Results  Labs (all labs ordered are listed, but only abnormal results are displayed) Labs Reviewed - No data to display  EKG   Radiology No results found.  Procedures Procedures (including critical care time)  Medications Ordered in UC Medications - No data to display  Initial Impression / Assessment and Plan / UC Course  I have reviewed the triage vital signs and the nursing notes.  Pertinent labs & imaging results that were available during my care of the patient were reviewed by me and considered in my medical decision making (see chart for details).  Rash  Unknown etiology we will provide coverage for both infection and inflammation, discussed this with patient, cephalexin and prednisone taper sent to pharmacy, may continue use of over-the-counter topical products for management of pruritus as well as oral antihistamines as needed, may follow-up with urgent care as needed for any concerns regarding healing Final Clinical Impressions(s) / UC Diagnoses   Final diagnoses:  None   Discharge Instructions   None    ED Prescriptions   None    PDMP not reviewed this encounter.   Valinda Hoar, NP 03/25/23 1252

## 2023-03-25 NOTE — ED Triage Notes (Signed)
Presents with rash on face x 2 days. Pt had absorbable suture 2 weeks ago.

## 2023-11-02 ENCOUNTER — Emergency Department (HOSPITAL_COMMUNITY): Payer: Medicaid Other

## 2023-11-02 ENCOUNTER — Encounter (HOSPITAL_COMMUNITY): Payer: Self-pay

## 2023-11-02 ENCOUNTER — Other Ambulatory Visit (HOSPITAL_COMMUNITY)
Admission: EM | Admit: 2023-11-02 | Discharge: 2023-11-02 | Disposition: A | Discharge status: Other Acute Inpatient Hospital | Payer: Medicaid Other | Attending: Psychiatry | Admitting: Psychiatry

## 2023-11-02 ENCOUNTER — Emergency Department (HOSPITAL_COMMUNITY)
Admission: EM | Admit: 2023-11-02 | Discharge: 2023-11-03 | Disposition: A | Discharge status: Other Acute Inpatient Hospital | Payer: Medicaid Other | Attending: Emergency Medicine | Admitting: Emergency Medicine

## 2023-11-02 ENCOUNTER — Other Ambulatory Visit: Payer: Self-pay

## 2023-11-02 DIAGNOSIS — F32A Depression, unspecified: Secondary | ICD-10-CM | POA: Diagnosis not present

## 2023-11-02 DIAGNOSIS — F1490 Cocaine use, unspecified, uncomplicated: Secondary | ICD-10-CM | POA: Diagnosis not present

## 2023-11-02 DIAGNOSIS — F1020 Alcohol dependence, uncomplicated: Secondary | ICD-10-CM | POA: Insufficient documentation

## 2023-11-02 DIAGNOSIS — F1410 Cocaine abuse, uncomplicated: Secondary | ICD-10-CM | POA: Insufficient documentation

## 2023-11-02 DIAGNOSIS — F17210 Nicotine dependence, cigarettes, uncomplicated: Secondary | ICD-10-CM | POA: Insufficient documentation

## 2023-11-02 DIAGNOSIS — F1090 Alcohol use, unspecified, uncomplicated: Secondary | ICD-10-CM | POA: Diagnosis present

## 2023-11-02 DIAGNOSIS — J45909 Unspecified asthma, uncomplicated: Secondary | ICD-10-CM | POA: Insufficient documentation

## 2023-11-02 DIAGNOSIS — R079 Chest pain, unspecified: Secondary | ICD-10-CM

## 2023-11-02 DIAGNOSIS — R0789 Other chest pain: Secondary | ICD-10-CM | POA: Insufficient documentation

## 2023-11-02 DIAGNOSIS — F1010 Alcohol abuse, uncomplicated: Secondary | ICD-10-CM | POA: Diagnosis present

## 2023-11-02 LAB — CBC WITH DIFFERENTIAL/PLATELET
Abs Immature Granulocytes: 0.03 10*3/uL (ref 0.00–0.07)
Basophils Absolute: 0.1 10*3/uL (ref 0.0–0.1)
Basophils Relative: 1 %
Eosinophils Absolute: 0.3 10*3/uL (ref 0.0–0.5)
Eosinophils Relative: 4 %
HCT: 36.7 % (ref 36.0–46.0)
Hemoglobin: 12.9 g/dL (ref 12.0–15.0)
Immature Granulocytes: 0 %
Lymphocytes Relative: 41 %
Lymphs Abs: 3.8 10*3/uL (ref 0.7–4.0)
MCH: 34.2 pg — ABNORMAL HIGH (ref 26.0–34.0)
MCHC: 35.1 g/dL (ref 30.0–36.0)
MCV: 97.3 fL (ref 80.0–100.0)
Monocytes Absolute: 0.5 10*3/uL (ref 0.1–1.0)
Monocytes Relative: 6 %
Neutro Abs: 4.5 10*3/uL (ref 1.7–7.7)
Neutrophils Relative %: 48 %
Platelets: 390 10*3/uL (ref 150–400)
RBC: 3.77 MIL/uL — ABNORMAL LOW (ref 3.87–5.11)
RDW: 12.7 % (ref 11.5–15.5)
WBC: 9.2 10*3/uL (ref 4.0–10.5)
nRBC: 0 % (ref 0.0–0.2)

## 2023-11-02 LAB — RAPID URINE DRUG SCREEN, HOSP PERFORMED
Amphetamines: POSITIVE — AB
Barbiturates: NOT DETECTED
Benzodiazepines: NOT DETECTED
Cocaine: POSITIVE — AB
Opiates: NOT DETECTED
Tetrahydrocannabinol: POSITIVE — AB

## 2023-11-02 LAB — POCT URINE DRUG SCREEN - MANUAL ENTRY (I-SCREEN)
POC Amphetamine UR: NOT DETECTED
POC Buprenorphine (BUP): NOT DETECTED
POC Cocaine UR: POSITIVE — AB
POC Marijuana UR: POSITIVE — AB
POC Methadone UR: NOT DETECTED
POC Methamphetamine UR: POSITIVE — AB
POC Morphine: NOT DETECTED
POC Oxazepam (BZO): NOT DETECTED
POC Oxycodone UR: NOT DETECTED
POC Secobarbital (BAR): NOT DETECTED

## 2023-11-02 LAB — HEMOGLOBIN A1C
Hgb A1c MFr Bld: 4.4 % — ABNORMAL LOW (ref 4.8–5.6)
Mean Plasma Glucose: 79.58 mg/dL

## 2023-11-02 LAB — TROPONIN I (HIGH SENSITIVITY)
Troponin I (High Sensitivity): 34 ng/L — ABNORMAL HIGH (ref <18)
Troponin I (High Sensitivity): 37 ng/L — ABNORMAL HIGH (ref <18)

## 2023-11-02 LAB — COMPREHENSIVE METABOLIC PANEL
ALT: 33 U/L (ref 0–44)
AST: 47 U/L — ABNORMAL HIGH (ref 15–41)
Albumin: 3.9 g/dL (ref 3.5–5.0)
Alkaline Phosphatase: 53 U/L (ref 38–126)
Anion gap: 14 (ref 5–15)
BUN: 6 mg/dL (ref 6–20)
CO2: 23 mmol/L (ref 22–32)
Calcium: 9.8 mg/dL (ref 8.9–10.3)
Chloride: 103 mmol/L (ref 98–111)
Creatinine, Ser: 0.75 mg/dL (ref 0.44–1.00)
GFR, Estimated: >60 mL/min (ref >60)
Glucose, Bld: 81 mg/dL (ref 70–99)
Potassium: 3.4 mmol/L — ABNORMAL LOW (ref 3.5–5.1)
Sodium: 140 mmol/L (ref 135–145)
Total Bilirubin: 0.9 mg/dL (ref 0.0–1.2)
Total Protein: 7.7 g/dL (ref 6.5–8.1)

## 2023-11-02 LAB — HCG, QUANTITATIVE, PREGNANCY: hCG, Beta Chain, Quant, S: <1 m[IU]/mL (ref <5)

## 2023-11-02 LAB — LIPID PANEL
Cholesterol: 197 mg/dL (ref 0–200)
HDL: 80 mg/dL (ref >40)
LDL Cholesterol: 102 mg/dL — ABNORMAL HIGH (ref 0–99)
Total CHOL/HDL Ratio: 2.5 {ratio}
Triglycerides: 74 mg/dL (ref <150)
VLDL: 15 mg/dL (ref 0–40)

## 2023-11-02 LAB — MAGNESIUM: Magnesium: 2.1 mg/dL (ref 1.7–2.4)

## 2023-11-02 LAB — TSH: TSH: 1.609 u[IU]/mL (ref 0.350–4.500)

## 2023-11-02 LAB — ETHANOL: Alcohol, Ethyl (B): <10 mg/dL (ref <10)

## 2023-11-02 NOTE — ED Provider Triage Note (Signed)
 Emergency Medicine Provider Triage Evaluation Note  Carla Rollins , a 28 y.o. female  was evaluated in triage.  Pt complains of chest pain occurring intermittently over the last few days. Reports daily cocaine and ethanol use. Drinks 1/5 liquor/day and snorts 20g of cocaine daily. Denies SI/HI. No hx of DT or seizures with alcohol withdrawal. Reports increased stress. Last drink and cocaine use last night.   Review of Systems  Positive: Chest pain Negative: sob  Physical Exam  BP 138/77 (BP Location: Right Arm)   Pulse 80   Temp 98.2 F (36.8 C) (Oral)   Resp 16   SpO2 98%  Gen:   Awake, no distress   Resp:  Normal effort  MSK:   Moves extremities without difficulty  Other:    Medical Decision Making  Medically screening exam initiated at 7:10 PM.  Appropriate orders placed.  Howard Patton was informed that the remainder of the evaluation will be completed by another provider, this initial triage assessment does not replace that evaluation, and the importance of remaining in the ED until their evaluation is complete.     Pete Pelt, [place] 11/02/23 1913
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
## 2023-11-02 NOTE — ED Triage Notes (Addendum)
 Pt bib ems from [facility]; checked in to help with  etoh and cocaine abuse; while crying, mentioned to staff she was having CP; EKG done, [facility] reported abnormal; ems reports EKG unremarkable; denies pain now; BP 126/80, HR 75, 99% RA; pt endorses cocaine and etoh use last night; denies SI/HI

## 2023-11-02 NOTE — ED Notes (Signed)
 Patient sent to [facility] ED for medical clearance for chest pain and abnormal EKG. Pt's belongings returned. Pt transported by EMS.
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
## 2023-11-02 NOTE — Progress Notes (Signed)
   11/02/23 1517  [facility] Triage Screening (Walk-ins at [facility] only)  What Is the Reason for Your Visit/Call Today? Pt presents to [facility] voluntarily accompanied by her father.

## 2023-11-02 NOTE — ED Provider Notes (Addendum)
 Carla Rollins is a 28 yr old female that is admitted to [facility] at [facility] for detox from alcohol and cocaine. During admission EKG, nursing staff reported that pt was complaining of chest tightness/ pain. Her EKG results showed prolonged QT and ST wave abnormality. QT/Qtc 452/534.  Pt denies history of cardiac issues.  Her VS are stable, no signs of respiratory distress. Pt is being transferred to [facility] ED for cardiac work up and medical clearance. Report called to EDP, Dr. Prince Solian. Pt is voluntary and can return to [facility] once medically cleared.   UDS positive for THC, Cocaine and Methamphetamine

## 2023-11-02 NOTE — Discharge Instructions (Addendum)
 Transfer to [facility] ED
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
## 2023-11-03 ENCOUNTER — Other Ambulatory Visit (HOSPITAL_COMMUNITY)
Admission: EM | Admit: 2023-11-03 | Discharge: 2023-11-06 | Disposition: A | Discharge status: Home / Self Care | Attending: Psychiatry | Admitting: Psychiatry

## 2023-11-03 DIAGNOSIS — F1090 Alcohol use, unspecified, uncomplicated: Secondary | ICD-10-CM | POA: Diagnosis present

## 2023-11-03 DIAGNOSIS — F1010 Alcohol abuse, uncomplicated: Secondary | ICD-10-CM | POA: Diagnosis present

## 2023-11-03 DIAGNOSIS — Z560 Unemployment, unspecified: Secondary | ICD-10-CM | POA: Insufficient documentation

## 2023-11-03 DIAGNOSIS — F32A Depression, unspecified: Secondary | ICD-10-CM | POA: Insufficient documentation

## 2023-11-03 DIAGNOSIS — F1994 Other psychoactive substance use, unspecified with psychoactive substance-induced mood disorder: Secondary | ICD-10-CM

## 2023-11-03 DIAGNOSIS — F1410 Cocaine abuse, uncomplicated: Secondary | ICD-10-CM

## 2023-11-03 DIAGNOSIS — F10230 Alcohol dependence with withdrawal, uncomplicated: Secondary | ICD-10-CM

## 2023-11-03 MED ORDER — LORAZEPAM 2 MG/ML IJ SOLN: 2.0000 mg | Freq: Three times a day (TID) | INTRAMUSCULAR | Status: DC | PRN

## 2023-11-03 MED ORDER — MAGNESIUM HYDROXIDE 400 MG/5ML PO SUSP: 30.0000 mL | Freq: Every day | ORAL | Status: DC | PRN

## 2023-11-03 MED ORDER — LOPERAMIDE HCL 2 MG PO CAPS: 2.0000 mg | ORAL_CAPSULE | ORAL | Status: DC | PRN

## 2023-11-03 MED ORDER — HYDROXYZINE HCL 25 MG PO TABS
25.0000 mg | ORAL_TABLET | Freq: Four times a day (QID) | ORAL | Status: DC | PRN
  Administered 2023-11-04: 25 mg via ORAL
  Filled 2023-11-03: qty 1

## 2023-11-03 MED ORDER — HALOPERIDOL LACTATE 5 MG/ML IJ SOLN: 10.0000 mg | Freq: Three times a day (TID) | INTRAMUSCULAR | Status: DC | PRN

## 2023-11-03 MED ORDER — DIPHENHYDRAMINE HCL 50 MG/ML IJ SOLN: 50.0000 mg | Freq: Three times a day (TID) | INTRAMUSCULAR | Status: DC | PRN

## 2023-11-03 MED ORDER — ADULT MULTIVITAMIN W/MINERALS CH
1.0000 | ORAL_TABLET | Freq: Every day | ORAL | Status: DC
  Administered 2023-11-03 – 2023-11-06 (×4): 1 via ORAL
  Filled 2023-11-03 (×4): qty 1

## 2023-11-03 MED ORDER — LORAZEPAM 1 MG PO TABS
1.0000 mg | ORAL_TABLET | Freq: Four times a day (QID) | ORAL | Status: AC
  Administered 2023-11-03 (×4): 1 mg via ORAL
  Filled 2023-11-03 (×4): qty 1

## 2023-11-03 MED ORDER — LORAZEPAM 1 MG PO TABS
1.0000 mg | ORAL_TABLET | Freq: Three times a day (TID) | ORAL | Status: AC
  Administered 2023-11-04 (×3): 1 mg via ORAL
  Filled 2023-11-03 (×3): qty 1

## 2023-11-03 MED ORDER — HALOPERIDOL 5 MG PO TABS: 5.0000 mg | ORAL_TABLET | Freq: Three times a day (TID) | ORAL | Status: DC | PRN

## 2023-11-03 MED ORDER — ONDANSETRON 4 MG PO TBDP: 4.0000 mg | ORAL_TABLET | Freq: Four times a day (QID) | ORAL | Status: DC | PRN

## 2023-11-03 MED ORDER — DIPHENHYDRAMINE HCL 50 MG PO CAPS: 50.0000 mg | ORAL_CAPSULE | Freq: Three times a day (TID) | ORAL | Status: DC | PRN

## 2023-11-03 MED ORDER — TRAZODONE HCL 50 MG PO TABS
50.0000 mg | ORAL_TABLET | Freq: Every evening | ORAL | Status: DC | PRN
  Administered 2023-11-04 – 2023-11-05 (×2): 50 mg via ORAL
  Filled 2023-11-03 (×2): qty 1

## 2023-11-03 MED ORDER — ALUM & MAG HYDROXIDE-SIMETH 200-200-20 MG/5ML PO SUSP: 30.0000 mL | ORAL | Status: DC | PRN

## 2023-11-03 MED ORDER — HALOPERIDOL LACTATE 5 MG/ML IJ SOLN: 5.0000 mg | Freq: Three times a day (TID) | INTRAMUSCULAR | Status: DC | PRN

## 2023-11-03 MED ORDER — LORAZEPAM 1 MG PO TABS
1.0000 mg | ORAL_TABLET | Freq: Two times a day (BID) | ORAL | Status: DC
  Administered 2023-11-05 – 2023-11-06 (×3): 1 mg via ORAL
  Filled 2023-11-03 (×2): qty 1

## 2023-11-03 MED ORDER — THIAMINE MONONITRATE 100 MG PO TABS
100.0000 mg | ORAL_TABLET | Freq: Every day | ORAL | Status: DC
  Administered 2023-11-04 – 2023-11-06 (×3): 100 mg via ORAL
  Filled 2023-11-03 (×3): qty 1

## 2023-11-03 MED ORDER — THIAMINE HCL 100 MG/ML IJ SOLN
100.0000 mg | Freq: Once | INTRAMUSCULAR | Status: AC
  Administered 2023-11-03: 100 mg via INTRAMUSCULAR
  Filled 2023-11-03: qty 2

## 2023-11-03 MED ORDER — LORAZEPAM 1 MG PO TABS: 1.0000 mg | ORAL_TABLET | Freq: Four times a day (QID) | ORAL | Status: DC | PRN

## 2023-11-03 MED ORDER — ACETAMINOPHEN 325 MG PO TABS
650.0000 mg | ORAL_TABLET | Freq: Four times a day (QID) | ORAL | Status: DC | PRN
  Administered 2023-11-04 – 2023-11-05 (×4): 650 mg via ORAL
  Filled 2023-11-03 (×4): qty 2

## 2023-11-03 MED ORDER — BENZOCAINE 10 % MT GEL
1.0000 | Freq: Once | OROMUCOSAL | Status: AC
  Administered 2023-11-03: 1 via OROMUCOSAL
  Filled 2023-11-03: qty 9

## 2023-11-03 MED ORDER — LORAZEPAM 1 MG PO TABS
1.0000 mg | ORAL_TABLET | Freq: Once | ORAL | Status: DC
  Filled 2023-11-03: qty 1

## 2023-11-03 NOTE — ED Provider Notes (Signed)
 Facility Based Crisis Admission H&P  Date: 11/03/23 Patient Name: Carla Rollins MRN: [id number] Chief Complaint: Alcohol and Cocaine abuse  Diagnoses:  Final diagnoses:  None    HPI:  Per [name] is a 28 year old female who presented to [facility] on 11/02/23 accompanied by her father seeking alcohol and cocaine detox as well as treatment for depressive symptoms.  Patient was evaluated by [name], NP and recommended for admission to [facility].  At the time of admission, the patient complained of chest pain and was consequently transferred to [facility] ED for medical clearance.    Patient transferred back from [facility] ED on 11/03/23 post medical clearance.  This nurse practitioner met with patient face-to-face and reviewed her chart upon her arrival back to [facility].  On assessment, patient was noted to be sleeping in assessment room. She is drowsy but oriented x4. Speech is slow but coherent. Eye contact is fair, thought process is coherent.  There were no signs the patient is responding to any stimuli or experiencing any delusional thought content.  Patient denies suicidal ideation, homicidal ideation, hallucination, paranoia. She denies chest pain/discomfort, palpitations, dizziness, or shortness of breath   Per chart: Name of Substance: Alcohol Age of first use: "Pretty much my whole life" Amount using: 1 bottle of tequila a day Frequency of use: Daily  How long using: 6 months Last used: Today  Withdrawal symptoms: Tremors, sweating, nausea and headaches Longest period of sobriety: A few days   Name of Substance: Cocaine Age of first use: 28 yrs old Amount using: $20 worth Frequency of use: Daily How long using:4 years Last used: Yesterday Withdrawal symptoms: Tiredness Longest period of sobriety: A few days   On Interview 11/03/2023: Patient seen laying in bed this morning on my approach. She reports that she presented to the [facility] for alcohol abuse and she reports that she  also uses cocaine daily. She was recently living with her sister and her son however she was kicked out of the home. She states that her alcohol use has been causing a lot of dysfunction in her life and she has a friend that was recently admitted to the [facility] and benefited from treatment. Her father was concerned about her substance abuse as well and recommended that she come to the hospital to receive treatment.  She states that she has been using a fifth of hard liquor daily for most of her life. She has also been using 20 dollars worth of cocaine intranasally daily. She denies any history of complicated alcohol withdrawal and she has never been to a treatment facility like this. The patient is interested in going to a rehab program once she has completed detox. She denies any SI/HI/AVH.  PHQ 2-9:   Flowsheet Row ED from 11/03/2023 in [facility] Most recent reading at 11/03/2023  5:14 AM ED from 11/02/2023 in [facility] Emergency Department at [facility] Most recent reading at 11/02/2023  6:59 PM ED from 11/02/2023 in [facility] Most recent reading at 11/02/2023  3:42 PM  C-SSRS RISK CATEGORY No Risk No Risk No Risk       Screenings    Flowsheet Row Most Recent Value  CIWA-Ar Total 2       Total Time spent with patient: 1 hour  Musculoskeletal  Strength & Muscle Tone: within normal limits Gait & Station: normal Patient leans: N/A  Psychiatric Specialty Exam  Presentation General Appearance:  Casual  Eye Contact: Poor  Speech: Clear and Coherent  Speech Volume: Normal  Handedness: Right   Mood and Affect  Mood: Depressed  Affect: Congruent   Thought Process  Thought Processes: Coherent  Descriptions of Associations:Intact  Orientation:Full (Time, Place and Person)  Thought Content:WDL    Hallucinations:Hallucinations: None  Ideas of Reference:None  Suicidal Thoughts:Suicidal Thoughts:  No  Homicidal Thoughts:Homicidal Thoughts: No   Sensorium  Memory: Immediate Good; Recent Fair; Remote Fair  Judgment: Fair  Insight: Fair   Art therapist  Concentration: Fair  Attention Span: Fair  Recall: Fair  Fund of Knowledge: Fair  Language: Fair   Psychomotor Activity  Psychomotor Activity: Psychomotor Activity: Normal   Assets  Assets: Desire for Improvement   Sleep  Sleep: Sleep: Poor Number of Hours of Sleep: 4   Nutritional Assessment (For OBS and [facility] admissions only) Has the patient had a weight loss or gain of 10 pounds or more in the last 3 months?: No Has the patient had a decrease in food intake/or appetite?: Yes Does the patient have dental problems?: No Does the patient have eating habits or behaviors that may be indicators of an eating disorder including binging or inducing vomiting?: No Has the patient recently lost weight without trying?: 0 Has the patient been eating poorly because of a decreased appetite?: 1 Malnutrition Screening Tool Score: 1    Physical Exam ROS  Blood pressure 130/80, pulse 72, temperature 98.2 F (36.8 C), temperature source Oral, resp. rate 16, last menstrual period 10/19/2023, SpO2 100%, unknown if currently breastfeeding. There is no height or weight on file to calculate BMI.  Past Psychiatric History:  Report history of Depression that has never been treated   Is the patient at risk to self? No  Has the patient been a risk to self in the past 6 months? No .    Has the patient been a risk to self within the distant past? No   Is the patient a risk to others? No   Has the patient been a risk to others in the past 6 months? No   Has the patient been a risk to others within the distant past? No   Past Medical History: Asthma Social History: Patient reports that she was born in [place] and raised between [place] and [place]. She denies any issues with walking or talking as a child.  She stopped going to school in the 11th grade. She is currently single and she has one son. She is unemployed and currently homeless but she does have family she may be able to stay with. She reports previous criminal charges.  Last Labs:  Admission on 11/02/2023, Discharged on 11/03/2023  Component Date Value Ref Range Status   Troponin I (High Sensitivity) 11/02/2023 34 (H)  <18 ng/L Final   Comment: (NOTE) Elevated high sensitivity troponin I (hsTnI) values and significant  changes across serial measurements may suggest ACS but many other  chronic and acute conditions are known to elevate hsTnI results.  Refer to the "Links" section for chest pain algorithms and additional  guidance. Performed at [facility] Lab, [address], [place], [place] [address]    Opiates 11/02/2023 NONE DETECTED  NONE DETECTED Final   Cocaine 11/02/2023 POSITIVE (A)  NONE DETECTED Final   Benzodiazepines 11/02/2023 NONE DETECTED  NONE DETECTED Final   Amphetamines 11/02/2023 POSITIVE (A)  NONE DETECTED Final   Comment: (NOTE) Trazodone is metabolized in vivo to several metabolites, including pharmacologically active m-CPP, which is excreted in  the urine. Immunoassay screens for amphetamines and MDMA have potential cross-reactivity with these compounds and may provide false positive  results.     Tetrahydrocannabinol 11/02/2023 POSITIVE (A)  NONE DETECTED Final   Barbiturates 11/02/2023 NONE DETECTED  NONE DETECTED Final   Comment: (NOTE) DRUG SCREEN FOR MEDICAL PURPOSES ONLY.  IF CONFIRMATION IS NEEDED FOR ANY PURPOSE, NOTIFY LAB WITHIN 5 DAYS.  LOWEST DETECTABLE LIMITS FOR URINE DRUG SCREEN Drug Class                     Cutoff (ng/mL) Amphetamine and metabolites    1000 Barbiturate and metabolites    200 Benzodiazepine                 200 Opiates and metabolites        300 Cocaine and metabolites        300 THC                            50 Performed at [facility] Lab, [address] [place], [place] [address]    hCG, Conley Rolls, Sharene Butters, Kathie Rhodes 11/02/2023 <1  <5 mIU/mL Final   Comment:          GEST. AGE      CONC.  (mIU/mL)   <=1 WEEK        5 - 50     2 WEEKS       50 - 500     3 WEEKS       100 - 10,000     4 WEEKS     1,000 - 30,000     5 WEEKS     3,500 - 115,000   6-8 WEEKS     12,000 - 270,000    12 WEEKS     15,000 - 220,000        FEMALE AND NON-PREGNANT FEMALE:     LESS THAN 5 mIU/mL Performed at [facility], [address], [place], [place] [address]    Cholesterol 11/02/2023 197  0 - 200 mg/dL Final   Triglycerides 09/81/1914 74  <150 mg/dL Final   HDL 78/29/5621 80  >40 mg/dL Final   Total CHOL/HDL Ratio 11/02/2023 2.5  RATIO Final   VLDL 11/02/2023 15  0 - 40 mg/dL Final   LDL Cholesterol 11/02/2023 102 (H)  0 - 99 mg/dL Final   Comment:        Total Cholesterol/HDL:CHD Risk Coronary Heart Disease Risk Table                     Men   Women  1/2 Average Risk   3.4   3.3  Average Risk       5.0   4.4  2 X Average Risk   9.6   7.1  3 X Average Risk  23.4   11.0        Use the calculated Patient Ratio above and the CHD Risk Table to determine the patient's CHD Risk.        ATP III CLASSIFICATION (LDL):  <100     mg/dL   Optimal  308-657  mg/dL   Near or Above                    Optimal  130-159  mg/dL   Borderline  846-962  mg/dL   High  >952  mg/dL   Very High Performed at [facility] Lab, [address], [place], [place] [address]    TSH 11/02/2023 1.609  0.350 - 4.500 uIU/mL Final   Comment: Performed by a 3rd Generation assay with a functional sensitivity of <=0.01 uIU/mL. Performed at [facility], [address] [place], [place] [address]    Troponin I (High Sensitivity) 11/02/2023 37 (H)  <18 ng/L Final   Comment: (NOTE) Elevated high sensitivity troponin I (hsTnI) values and significant  changes across serial measurements may suggest ACS but many other  chronic and acute conditions are known to elevate hsTnI  results.  Refer to the "Links" section for chest pain algorithms and additional  guidance. Performed at [facility] Lab, [address] [place], [place] [address]   Admission on 11/02/2023, Discharged on 11/02/2023  Component Date Value Ref Range Status   WBC 11/02/2023 9.2  4.0 - 10.5 K/uL Final   RBC 11/02/2023 3.77 (L)  3.87 - 5.11 MIL/uL Final   Hemoglobin 11/02/2023 12.9  12.0 - 15.0 g/dL Final   HCT 40/06/2724 36.7  36.0 - 46.0 % Final   MCV 11/02/2023 97.3  80.0 - 100.0 fL Final   MCH 11/02/2023 34.2 (H)  26.0 - 34.0 pg Final   MCHC 11/02/2023 35.1  30.0 - 36.0 g/dL Final   RDW 36/64/4034 12.7  11.5 - 15.5 % Final   Platelets 11/02/2023 390  150 - 400 K/uL Final   nRBC 11/02/2023 0.0  0.0 - 0.2 % Final   Neutrophils Relative % 11/02/2023 48  % Final   Neutro Abs 11/02/2023 4.5  1.7 - 7.7 K/uL Final   Lymphocytes Relative 11/02/2023 41  % Final   Lymphs Abs 11/02/2023 3.8  0.7 - 4.0 K/uL Final   Monocytes Relative 11/02/2023 6  % Final   Monocytes Absolute 11/02/2023 0.5  0.1 - 1.0 K/uL Final   Eosinophils Relative 11/02/2023 4  % Final   Eosinophils Absolute 11/02/2023 0.3  0.0 - 0.5 K/uL Final   Basophils Relative 11/02/2023 1  % Final   Basophils Absolute 11/02/2023 0.1  0.0 - 0.1 K/uL Final   Immature Granulocytes 11/02/2023 0  % Final   Abs Immature Granulocytes 11/02/2023 0.03  0.00 - 0.07 K/uL Final   Performed at [facility] Lab, [address], [place], [place] [address]   Sodium 11/02/2023 140  135 - 145 mmol/L Final   Potassium 11/02/2023 3.4 (L)  3.5 - 5.1 mmol/L Final   Chloride 11/02/2023 103  98 - 111 mmol/L Final   CO2 11/02/2023 23  22 - 32 mmol/L Final   Glucose, Bld 11/02/2023 81  70 - 99 mg/dL Final   Glucose reference range applies only to samples taken after fasting for at least 8 hours.   BUN 11/02/2023 6  6 - 20 mg/dL Final   Creatinine, Ser 11/02/2023 0.75  0.44 - 1.00 mg/dL Final   Calcium 56/38/7564 9.8  8.9 - 10.3 mg/dL Final   Total  Protein 11/02/2023 7.7  6.5 - 8.1 g/dL Final   Albumin 33/29/5188 3.9  3.5 - 5.0 g/dL Final   AST 41/66/0630 47 (H)  15 - 41 U/L Final   ALT 11/02/2023 33  0 - 44 U/L Final   Alkaline Phosphatase 11/02/2023 53  38 - 126 U/L Final   Total Bilirubin 11/02/2023 0.9  0.0 - 1.2 mg/dL Final   GFR, Estimated 11/02/2023 >60  >60 mL/min Final   Comment: (NOTE) Calculated  using the CKD-EPI Creatinine Equation (2021)    Anion gap 11/02/2023 14  5 - 15 Final   Performed at [facility], [address], [place], [place] [address]   Hgb A1c MFr Bld 11/02/2023 4.4 (L)  4.8 - 5.6 % Final   Comment: (NOTE) Pre diabetes:          5.7%-6.4%  Diabetes:              >6.4%  Glycemic control for   <7.0% adults with diabetes    Mean Plasma Glucose 11/02/2023 79.58  mg/dL Final   Performed at [facility], [address], [place], [place] [address]   Magnesium 11/02/2023 2.1  1.7 - 2.4 mg/dL Final   Performed at [facility], [address] [place], [place] [address]   Alcohol, Ethyl (B) 11/02/2023 <10  <10 mg/dL Final   Comment: (NOTE) Lowest detectable limit for serum alcohol is 10 mg/dL.  For medical purposes only. Performed at [facility], [address] [place], [place] [address]    POC Amphetamine UR 11/02/2023 None Detected  NONE DETECTED (Cut Off Level 1000 ng/mL) Final   POC Secobarbital (BAR) 11/02/2023 None Detected  NONE DETECTED (Cut Off Level 300 ng/mL) Final   POC Buprenorphine (BUP) 11/02/2023 None Detected  NONE DETECTED (Cut Off Level 10 ng/mL) Final   POC Oxazepam (BZO) 11/02/2023 None Detected  NONE DETECTED (Cut Off Level 300 ng/mL) Final   POC Cocaine UR 11/02/2023 Positive (A)  NONE DETECTED (Cut Off Level 300 ng/mL) Final   POC Methamphetamine UR 11/02/2023 Positive (A)  NONE DETECTED (Cut Off Level 1000 ng/mL) Final   POC Morphine 11/02/2023 None Detected  NONE DETECTED (Cut Off Level 300 ng/mL) Final   POC Methadone UR 11/02/2023 None Detected  NONE  DETECTED (Cut Off Level 300 ng/mL) Final   POC Oxycodone UR 11/02/2023 None Detected  NONE DETECTED (Cut Off Level 100 ng/mL) Final   POC Marijuana UR 11/02/2023 Positive (A)  NONE DETECTED (Cut Off Level 50 ng/mL) Final    Allergies: Patient has no known allergies.  Medications:  Facility Ordered Medications  Medication   acetaminophen (TYLENOL) tablet 650 mg   alum & mag hydroxide-simeth (MAALOX/MYLANTA) 200-200-20 MG/5ML suspension 30 mL   magnesium hydroxide (MILK OF MAGNESIA) suspension 30 mL   haloperidol (HALDOL) tablet 5 mg   And   diphenhydrAMINE (BENADRYL) capsule 50 mg   haloperidol lactate (HALDOL) injection 5 mg   And   diphenhydrAMINE (BENADRYL) injection 50 mg   And   LORazepam (ATIVAN) injection 2 mg   haloperidol lactate (HALDOL) injection 10 mg   And   diphenhydrAMINE (BENADRYL) injection 50 mg   And   LORazepam (ATIVAN) injection 2 mg   traZODone (DESYREL) tablet 50 mg    Long Term Goals: Improvement in symptoms so as ready for discharge  Short Term Goals: Patient will verbalize feelings in meetings with treatment team members. and Patient will take medications as prescribed daily.  Medical Decision Making  Alcohol Use Disorder -Initiate Ativan Taper -Initiate Ativan with CIWA protocol -Recommend residential rehab after completing detox treatment  Stimulant Use Disorder Cocaine -Recommend residential rehab after completing detox treatment  Unspecified Depressive Disorder -Consider SSRI/SNRI in the future -Recommend outpatient therapy    Recommendations  Based on my evaluation the patient does not appear to have an emergency medical condition.  Harlin Heys, DO 11/03/23  9:12 AM

## 2023-11-03 NOTE — BH Assessment (Signed)
 Patient was seen by provider at [facility] and recommended for [facility] admission. Patient was sent to [facility] for medical clearance. Upon her arrival back to [facility] from [facility], TTS attempted to engage patient in the TTS assessment but was unsuccessful. Clinician called patients name multiple times but patient was unable to be engaged in assessment due to her somnolent state. Patient is observed sleeping in assessment room chair, wearing hospital scrubs.

## 2023-11-03 NOTE — ED Provider Notes (Signed)
 Carla Rollins is a 28 year old female who presented to [facility] on 11/02/23 accompanied by her father seeking alcohol and cocaine detox as well as treatment for depressive symptoms.  Patient was evaluated by [name], NP and recommended for admission to [facility].  At the time of admission, the patient complained of chest pain and was consequently transferred to [facility] ED for medical clearance.   Patient transferred back from [facility] ED on 11/03/23 post medical clearance.  This nurse practitioner met with patient face-to-face and reviewed her chart upon her arrival back to [facility].  On assessment, patient was noted to be sleeping in assessment room. She is drowsy but oriented x4. Speech is slow but coherent. Eye contact is fair, thought process is coherent.  There were no signs the patient is responding to any stimuli or experiencing any delusional thought content.  Patient denies suicidal ideation, homicidal ideation, hallucination, paranoia. She denies chest pain/discomfort, palpitations, dizziness, or shortness of breath  Per chart: Name of Substance: Alcohol Age of first use: "Pretty much my whole life" Amount using: 1 bottle of tequila a day Frequency of use: Daily  How long using: 6 months Last used: Today  Withdrawal symptoms: Tremors, sweating, nausea and headaches Longest period of sobriety: A few days   Name of Substance: Cocaine Age of first use: 28 yrs old Amount using: $20 worth Frequency of use: Daily How long using:4 years Last used: Yesterday Withdrawal symptoms: Tiredness Longest period of sobriety: A few days

## 2023-11-03 NOTE — ED Provider Notes (Signed)
 [facility]ENCY DEPT [facility] Emergency Department Provider Note MRN:  [id number]  Arrival date & time: 11/03/23     Chief Complaint   Chest pain History of Present Illness   Carla Rollins is a 28 y.o. year-old female with no pertinent past medical history presenting to the ED with chief complaint of chest pain.  Patient sent here from behavioral health, patient wants to detox from cocaine.  Had some abnormalities to her EKG at the behavioral health center.  When asked about chest pain she says that she had some discomfort in her chest when she was crying yesterday and the day before she had some discomfort in her chest when she was crying as well.  Has not had chest pain any other time.  Currently without symptoms.  Review of Systems  A thorough review of systems was obtained and all systems are negative except as noted in the HPI and PMH.   Patient's Health History    Past Medical History:  Diagnosis Date   Asthma    Chlamydia 2013   Infection     Past Surgical History:  Procedure Laterality Date   NO PAST SURGERIES      Family History  Problem Relation Age of Onset   Diabetes Maternal Grandmother    Cancer Maternal Grandfather     Social History   Socioeconomic History   Marital status: Single    Spouse name: Not on file   Number of children: Not on file   Years of education: Not on file   Highest education level: Not on file  Occupational History   Not on file  Tobacco Use   Smoking status: Every Day    Types: Cigarettes   Smokeless tobacco: Never  Substance and Sexual Activity   Alcohol use: No   Drug use: No   Sexual activity: Yes    Birth control/protection: None    Comment: preg  Other Topics Concern   Not on file  Social History Narrative   Not on file   Social Drivers of Health   Financial Resource Strain: Not on file  Food Insecurity: Not on file  Transportation Needs: Not on file  Physical Activity: Not on file  Stress: Not on file   Social Connections: Not on file  Intimate Partner Violence: Not At Risk (09/19/2023)   Received from [facility]   HITS    Over the last 12 months how often did your partner physically hurt you?: Never    Over the last 12 months how often did your partner insult you or talk down to you?: Never    Over the last 12 months how often did your partner threaten you with physical harm?: Never    Over the last 12 months how often did your partner scream or curse at you?: Never     Physical Exam   Vitals:   11/02/23 2246 11/03/23 0010  BP: (!) 147/88 122/71  Pulse: 87 94  Resp: 18 19  Temp:  97.8 F (36.6 C)  SpO2: 96% 100%    CONSTITUTIONAL: Well-appearing, NAD NEURO/PSYCH:  Alert and oriented x 3, no focal deficits EYES:  eyes equal and reactive ENT/NECK:  no LAD, no JVD CARDIO: Regular rate, well-perfused, normal S1 and S2 PULM:  CTAB no wheezing or rhonchi GI/GU:  non-distended, non-tender MSK/SPINE:  No gross deformities, no edema SKIN:  no rash, atraumatic   *Additional and/or pertinent findings included in MDM below  Diagnostic and Interventional Summary    EKG  Interpretation Date/Time:  Saturday November 03 2023 00:53:15 EST Ventricular Rate:  81 PR Interval:  131 QRS Duration:  127 QT Interval:  432 QTC Calculation: 502 R Axis:   67  Text Interpretation: Sinus rhythm Right atrial enlargement LVH with IVCD and secondary repol abnrm Prolonged QT interval Confirmed by Kennis Carina [phone]) on 11/03/2023 12:59:04 AM       Labs Reviewed  RAPID URINE DRUG SCREEN, HOSP PERFORMED - Abnormal; Notable for the following components:      Result Value   Cocaine POSITIVE (*)    Amphetamines POSITIVE (*)    Tetrahydrocannabinol POSITIVE (*)    All other components within normal limits  LIPID PANEL - Abnormal; Notable for the following components:   LDL Cholesterol 102 (*)    All other components within normal limits  TROPONIN I (HIGH SENSITIVITY) - Abnormal; Notable for the  following components:   Troponin I (High Sensitivity) 34 (*)    All other components within normal limits  TROPONIN I (HIGH SENSITIVITY) - Abnormal; Notable for the following components:   Troponin I (High Sensitivity) 37 (*)    All other components within normal limits  HCG, QUANTITATIVE, PREGNANCY  TSH  CBC WITH DIFFERENTIAL/PLATELET    DG Chest 2 View  Final Result      Medications - No data to display   Procedures  /  Critical Care Procedures  ED Course and Medical Decision Making  Initial Impression and Ddx Chest pain in the setting of cocaine use.  Not currently having chest pain.  EKG showing some nonspecific ST segment findings with no prior for comparison.  Awaiting labs.  No evidence of DVT, no hypoxia, no tachycardia, doubt PE.  Past medical/surgical history that increases complexity of ED encounter: Cocaine use  Interpretation of Diagnostics I personally reviewed the EKG and my interpretation is as follows: ST segment findings, sinus rhythm, nonspecific, no prior for comparison  Labs reveal mildly elevated troponin at 34, largely flat upon repeat.  No significant blood count or electrolyte disturbance.  Patient Reassessment and Ultimate Disposition/Management     Case discussed with Dr. Orson Aloe of cardiology.  Given the minimally elevated troponins that are flat and the lack of active chest pain and the very likely trigger for all of this being the recent cocaine use, there is little benefit to admission at this time.  Would be exceedingly rare for patient to have actual type I NSTEMI at this time related to a coronary blockage.  And so I discussed this with the patient.  I offered admission to the hospital for continued monitoring, possibly in echocardiogram.  She prefers to leave the hospital, she wants to detox from cocaine and feels that the detox center will be able to manage her detox best.  This overall seems appropriate, strict return precautions.  Patient  management required discussion with the following services or consulting groups:  Cardiology  Complexity of Problems Addressed Acute illness or injury that poses threat of life of bodily function  Additional Data Reviewed and Analyzed Further history obtained from: Recent Consult notes  Additional Factors Impacting ED Encounter Risk Consideration of hospitalization  Elmer Sow. Pilar Plate, MD [facility] Emergency Medicine [facility] [email or web address]  Final Clinical Impressions(s) / ED Diagnoses     ICD-10-CM   1. Cocaine use  F14.90     2. Chest pain, unspecified type  R07.9 Ambulatory referral to Cardiology      ED Discharge Orders  Ordered    Ambulatory referral to Cardiology        11/03/23 0209             Discharge Instructions Discussed with and Provided to Patient:    Discharge Instructions      You were evaluated in the Emergency Department and after careful evaluation, we did not find any emergent condition requiring admission or further testing in the hospital.  Your exam/testing today is overall reassuring.  We discussed your slightly elevated heart numbers today.  Very important that you stop any cocaine use, would follow-up with cardiology as an outpatient.  Please return to the Emergency Department if you experience any worsening of your condition.   Thank you for allowing [place] to be a part of your care.      Sabas Sous, MD 11/03/23 [phone]

## 2023-11-03 NOTE — ED Notes (Signed)
 Patient in the bedroom calm and sleeping.  NAD.Respirations even and unlabored.  Will keep monitoring for safety

## 2023-11-03 NOTE — Discharge Instructions (Signed)
 You were evaluated in the Emergency Department and after careful evaluation, we did not find any emergent condition requiring admission or further testing in the hospital.  Your exam/testing today is overall reassuring.  We discussed your slightly elevated heart numbers today.  Very important that you stop any cocaine use, would follow-up with cardiology as an outpatient.  Please return to the Emergency Department if you experience any worsening of your condition.   Thank you for allowing [place] to be a part of your care.

## 2023-11-03 NOTE — Group Note (Signed)
 Group Topic: Balance in Life  Group Date: 11/03/2023 Start Time: 0310 End Time: 0340 Facilitators: Concha Norway, NT  Department: [facility]  Number of Participants: 8  Group Focus: acceptance Treatment Modality:  Behavior Modification Therapy Interventions utilized were clarification Purpose: explore maladaptive thinking  Name: Carla Rollins Date of Birth: 16-Dec-1995  MR: [id number]    Level of Participation: minimal Quality of Participation: attentive Interactions with others: gave feedback Mood/Affect: anxious Triggers (if applicable):  Cognition: concrete Progress: Moderate Response:  Plan: follow-up needed  Patients Problems:  Patient Active Problem List   Diagnosis Date Noted   Alcohol use disorder 11/02/2023   Vaginal delivery 05/07/2014   Post-dates pregnancy 05/04/2014   Marijuana abuse 10/17/2013   Supervision of normal pregnancy in first trimester 10/09/2013   Overweight 07/27/2009   Hearing loss 07/27/2009   ECZEMA 07/27/2009

## 2023-11-03 NOTE — Group Note (Signed)
 Group Topic: Relapse and Recovery  Group Date: 11/03/2023 Start Time: 2000 End Time: 2100 Facilitators: Rae Lips B  Department: [facility]  Number of Participants: 4  Group Focus: abuse issues, acceptance, and activities of daily living skills Treatment Modality:  Leisure Development Interventions utilized were leisure development Purpose: enhance coping skills, express feelings, increase insight, and relapse prevention strategies  Name: Carla Rollins Date of Birth: [birth year]  MR: [id number]    Level of Participation: PT DID NOT ATTEND GROUP Quality of Participation: cooperative Interactions with others: gave feedback Mood/Affect: appropriate Triggers (if applicable): NA Cognition: coherent/clear Progress: None Response: Had tooth pain and didn't want to go to groups.  Plan: patient will be encouraged to go to groups.   Patients Problems:  Patient Active Problem List   Diagnosis Date Noted   Alcohol use disorder 11/02/2023   Vaginal delivery 05/07/2014   Post-dates pregnancy 05/04/2014   Marijuana abuse 10/17/2013   Supervision of normal pregnancy in first trimester 10/09/2013   Overweight 07/27/2009   Hearing loss 07/27/2009   ECZEMA 07/27/2009

## 2023-11-03 NOTE — ED Notes (Signed)
 Patient arrived to the unit wearing scrubs. She is calm and cooperative and reports anxiety 7 and depression 10. She denies SIHI, AVH, and pain. The patient reports decreased appetite and reports sleeping about 4 hours nightly and reports having night mares. She currently denies withdrawal symptoms. Staff will continue to monitor safety and for changes in condition.

## 2023-11-03 NOTE — ED Notes (Addendum)
 Patient in the Dayroom calm and sleeping.  NAD,Denies needing anything atm.  Respirations even and unlabored. Will keep monitoring for safety

## 2023-11-03 NOTE — ED Notes (Signed)
 Pt is in pain from her tooth. RN trying to meds approved.

## 2023-11-04 DIAGNOSIS — F1994 Other psychoactive substance use, unspecified with psychoactive substance-induced mood disorder: Secondary | ICD-10-CM | POA: Insufficient documentation

## 2023-11-04 DIAGNOSIS — F32A Depression, unspecified: Secondary | ICD-10-CM | POA: Insufficient documentation

## 2023-11-04 DIAGNOSIS — F10230 Alcohol dependence with withdrawal, uncomplicated: Secondary | ICD-10-CM | POA: Diagnosis not present

## 2023-11-04 DIAGNOSIS — F1410 Cocaine abuse, uncomplicated: Secondary | ICD-10-CM | POA: Insufficient documentation

## 2023-11-04 DIAGNOSIS — Z560 Unemployment, unspecified: Secondary | ICD-10-CM | POA: Insufficient documentation

## 2023-11-04 MED ORDER — SERTRALINE HCL 50 MG PO TABS
50.0000 mg | ORAL_TABLET | Freq: Every day | ORAL | Status: DC
  Administered 2023-11-04 – 2023-11-06 (×3): 50 mg via ORAL
  Filled 2023-11-04 (×3): qty 1
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
## 2023-11-04 NOTE — Group Note (Signed)
 Group Topic: Positive Affirmations  Group Date: 11/04/2023 Start Time: 2030 End Time: 2045 Facilitators: Lauro Temari Schooler, NT  Department: [facility]  Number of Participants: 5  Group Focus: acceptance, affirmation, and feeling awareness/expression Treatment Modality:  Behavior Modification Therapy and Cognitive Behavioral Therapy Interventions utilized were clarification, leisure development, and support Purpose: express feelings, improve communication skills, and increase insight  Name: Carla Rollins Date of Birth: May 09, 1996  MR: [id number]    Level of Participation: active Quality of Participation: attentive and cooperative Interactions with others: gave feedback Mood/Affect: appropriate Triggers (if applicable): N/A Cognition: coherent/clear Progress: Gaining insight Response: N/A Plan: patient will be encouraged to keep attending groups.  Patients Problems:  Patient Active Problem List   Diagnosis Date Noted   Alcohol use disorder 11/02/2023   Vaginal delivery 05/07/2014   Post-dates pregnancy 05/04/2014   Marijuana abuse 10/17/2013   Supervision of normal pregnancy in first trimester 10/09/2013   Overweight 07/27/2009   Hearing loss 07/27/2009   ECZEMA 07/27/2009

## 2023-11-04 NOTE — Group Note (Signed)
 Group Topic: Relaxation  Group Date: 11/04/2023 Start Time: 1630 End Time: 1700 Facilitators: Vonzell Schlatter B  Department: [facility]  Number of Participants: 4  Group Focus: communication and concentration Treatment Modality:  Psychoeducation Interventions utilized were problem solving and support Purpose: enhance coping skills and express feelings  Name: Carla Rollins Date of Birth: Apr 27, 1996  MR: [id number]    Level of Participation: moderate Quality of Participation: attentive Interactions with others: gave feedback Mood/Affect: positive Triggers (if applicable): N/A Cognition: coherent/clear Progress: Moderate Response: Pt doesn't talk a lot but enjoy group Plan: follow-up needed  Patients Problems:  Patient Active Problem List   Diagnosis Date Noted   Alcohol use disorder 11/02/2023   Vaginal delivery 05/07/2014   Post-dates pregnancy 05/04/2014   Marijuana abuse 10/17/2013   Supervision of normal pregnancy in first trimester 10/09/2013   Overweight 07/27/2009   Hearing loss 07/27/2009   ECZEMA 07/27/2009

## 2023-11-04 NOTE — Group Note (Signed)
 Group Topic: Recovery Basics  Group Date: 11/04/2023 Start Time: 1230 End Time: 1300 Facilitators: Jenean Lindau, RN  Department: [facility]  Number of Participants: 8  Group Focus: chemical dependency education, chemical dependency issues, and clarity of thought Treatment Modality:  Behavior Modification Therapy Interventions utilized were exploration and patient education Purpose: enhance coping skills, explore maladaptive thinking, express feelings, express irrational fears, improve communication skills, increase insight, regain self-worth, reinforce self-care, relapse prevention strategies, and trigger / craving management  Name: Carla Rollins Date of Birth: 02-Jun-1996  MR: [id number]    Level of Participation: moderate Quality of Participation: cooperative Interactions with others: gave feedback Mood/Affect: appropriate Triggers (if applicable):   Cognition: coherent/clear Progress: Gaining insight Response:   Plan: follow-up needed  Patients Problems:  Patient Active Problem List   Diagnosis Date Noted   Alcohol use disorder 11/02/2023   Vaginal delivery 05/07/2014   Post-dates pregnancy 05/04/2014   Marijuana abuse 10/17/2013   Supervision of normal pregnancy in first trimester 10/09/2013   Overweight 07/27/2009   Hearing loss 07/27/2009   ECZEMA 07/27/2009

## 2023-11-04 NOTE — ED Notes (Signed)
 Patient awake and alert sitting in the day room eating breakfast without issue or complaint.  No distress or signs of withdrawal at this time.  Denies avh shi or plan.  Will monitor.

## 2023-11-05 DIAGNOSIS — F32A Depression, unspecified: Secondary | ICD-10-CM | POA: Diagnosis not present

## 2023-11-05 DIAGNOSIS — F1410 Cocaine abuse, uncomplicated: Secondary | ICD-10-CM | POA: Diagnosis not present

## 2023-11-05 DIAGNOSIS — Z560 Unemployment, unspecified: Secondary | ICD-10-CM | POA: Diagnosis not present

## 2023-11-05 DIAGNOSIS — F10230 Alcohol dependence with withdrawal, uncomplicated: Secondary | ICD-10-CM | POA: Diagnosis not present

## 2023-11-05 MED ORDER — HYDROXYZINE HCL 25 MG PO TABS: 25.0000 mg | ORAL_TABLET | Freq: Four times a day (QID) | ORAL | Status: DC | PRN

## 2023-11-05 MED ORDER — ADULT MULTIVITAMIN W/MINERALS CH: 1.0000 | ORAL_TABLET | Freq: Every day | ORAL | Status: DC

## 2023-11-05 MED ORDER — ONDANSETRON 4 MG PO TBDP: 4.0000 mg | ORAL_TABLET | Freq: Four times a day (QID) | ORAL | Status: DC | PRN

## 2023-11-05 MED ORDER — LORAZEPAM 1 MG PO TABS: 1.0000 mg | ORAL_TABLET | Freq: Four times a day (QID) | ORAL | Status: DC | PRN

## 2023-11-05 MED ORDER — NALTREXONE HCL 50 MG PO TABS
50.0000 mg | ORAL_TABLET | Freq: Every day | ORAL | Status: DC
Start: 2023-11-05 — End: 2023-11-06
  Administered 2023-11-05 – 2023-11-06 (×2): 50 mg via ORAL
  Filled 2023-11-05 (×2): qty 1

## 2023-11-05 MED ORDER — THIAMINE MONONITRATE 100 MG PO TABS: 100.0000 mg | ORAL_TABLET | Freq: Every day | ORAL | Status: DC

## 2023-11-05 MED ORDER — THIAMINE HCL 100 MG/ML IJ SOLN: 100.0000 mg | Freq: Once | INTRAMUSCULAR | Status: DC

## 2023-11-05 MED ORDER — LOPERAMIDE HCL 2 MG PO CAPS: 2.0000 mg | ORAL_CAPSULE | ORAL | Status: DC | PRN

## 2023-11-05 NOTE — Group Note (Signed)
 Group Topic: Wellness  Group Date: 11/05/2023 Start Time: 2030 End Time: 2045 Facilitators: Lauro Deeksha Cotrell, NT  Department: [facility]  Number of Participants: 4  Group Focus: activities of daily living skills and check in Treatment Modality:  Cognitive Behavioral Therapy Interventions utilized were clarification and support Purpose: express feelings and improve communication skills  Name: Carla Rollins Date of Birth: 08-08-1996  MR: [id number]    Level of Participation: active Quality of Participation: attentive Interactions with others: gave feedback Mood/Affect: appropriate Triggers (if applicable): N/A Cognition: coherent/clear Progress: Gaining insight Response: N/A Plan: patient will be encouraged to keep attending group.  Patients Problems:  Patient Active Problem List   Diagnosis Date Noted   Alcohol use disorder 11/02/2023   Vaginal delivery 05/07/2014   Post-dates pregnancy 05/04/2014   Marijuana abuse 10/17/2013   Supervision of normal pregnancy in first trimester 10/09/2013   Overweight 07/27/2009   Hearing loss 07/27/2009   ECZEMA 07/27/2009

## 2023-11-05 NOTE — ED Notes (Signed)
 Patient alert & oriented x4. Denies intent to harm self or others when asked. Denies A/VH. Patient reported pain in tooth radiating to head causing a headache rating 8/10. PRN Tylenol administered. Scheduled and PRN medications administered with no complications. No acute distress noted. Support and encouragement provided. Routine safety checks conducted per facility protocol. Encouraged patient to notify staff if any thoughts of harm towards self or others arise. Patient verbalizes understanding and agreement.

## 2023-11-05 NOTE — Group Note (Signed)
 Group Topic: Change and Accountability  Group Date: 11/05/2023 Start Time: 1630 End Time: 1648 Facilitators: Vonzell Schlatter B  Department: [facility]  Number of Participants: 4  Group Focus: clarity of thought and daily focus Treatment Modality:  Psychoeducation Interventions utilized were problem solving and support Purpose: regain self-worth and reinforce self-care  Name: Steffani Dionisio Date of Birth: April 02, 1996  MR: [id number]    Level of Participation: active Quality of Participation: attentive and cooperative Interactions with others: gave feedback Mood/Affect: positive Triggers (if applicable): N/A Cognition: coherent/clear Progress: Moderate Response: getting the help for her one and only son Plan: follow-up needed  Patients Problems:  Patient Active Problem List   Diagnosis Date Noted   Alcohol use disorder 11/02/2023   Vaginal delivery 05/07/2014   Post-dates pregnancy 05/04/2014   Marijuana abuse 10/17/2013   Supervision of normal pregnancy in first trimester 10/09/2013   Overweight 07/27/2009   Hearing loss 07/27/2009   ECZEMA 07/27/2009

## 2023-11-05 NOTE — Tx Team (Signed)
 LCSW met with patient at bedside to assess current mood, affect, physical state, and inquire about needs/goals while here in [facility] and after discharge. Patient reports she presented due to needing to seek help for her alcohol and drug use.  Patient reports she would like to stop her use, and stated her father brought her here for additional support.  Patient reports her father lives in [place], and brought her here in order to seek residential treatment.  Patient reports prior to arriving at [facility],  she was living with her sister but got kicked out. Patient reports sister does substances as well. Patient reports having some support from her mom who lives in [place]. Patient denies any other support other than her father. Patient reports daily alcohol use stating she drinks a bottle of tequila a day.  Patient reports her alcohol use got worse about 6 months ago.  LCSW explored if anything occurred within the last 6 months, and patient reports she is just stressed with life.  Patient reports she also lost her fianc and their home, however reports she has still been in contact with fianc they are just not together.  Patient also reports daily cocaine use stating she uses $20 a day.  Patient denies any inpatient or outpatient substance abuse treatment in the past.  Patient denies any legal charges or upcoming court dates.  Patient reports her current goal is to feel better, get clean, and move on with her life.  Patient reports her biggest concern is taking care of her son, who is currently with his father.  LCSW explored if patient would be interested in residential placement at this time.  Patient declined stating she would not have anyone to keep her son and provide the care she knows that she can provide him.  Brief supportive counseling was provided to the patient and she was receptive to the feedback provided.  Patient provided LCSW with permission to send her clinicals out for review.  Patient aware that she  has the right to make a decision regarding next steps.  LCSW explored additional coping strategies in place to help with current substance use. Patient reports she loves to eat, read the bible, and write.  Patient reports she needs some time to think of her decision at this moment.  LCSW informed the patient to inform staff if there are any additional resources needed.  No other concerns to report at this time.  LCSW will continue to follow and provide support to patient while on [facility] unit.Marland Kitchen   Referral has been sent to [facility] and [facility] for review.  LCSW will continue to follow and provide updates as received.   Carla Boyden, LCSW Clinical Social Worker [place] [facility] Ph: [phone]

## 2023-11-05 NOTE — Discharge Planning (Signed)
 LCSW went and spoke with patient regarding updates and if she wanted to consider residential placement. Patient made that she was accepted to [facility] for this week if she wanted further treatment. Patient reports she would prefer to do intensive outpatient at discharge as she has things she needs to take care of. Outpatient resources will be added in the AVS prior to discharge. No other needs to report at this time.   Fernande Boyden, LCSW Clinical Social Worker [place] [facility] Ph: [phone]

## 2023-11-05 NOTE — ED Notes (Signed)
 Patient resting with eyes closed in no apparent acute distress. Respirations even and unlabored. Environment secured. Safety checks in place according to facility policy.

## 2023-11-05 NOTE — Group Note (Signed)
 Group Topic: Social Support  Group Date: 11/05/2023 Start Time: 0830 End Time: 0845 Facilitators: Ashira Kirsten, Jacklynn Barnacle, RN  Department: [facility]  Number of Participants: 4  Group Focus: check in Treatment Modality:  Individual Therapy Interventions utilized were support Purpose: express feelings and increase insight  Name: Carla Rollins Date of Birth: 04-16-1996  MR: [id number]    Level of Participation: minimal Quality of Participation: cooperative Interactions with others: gave feedback Mood/Affect: appropriate Triggers (if applicable): None identified Cognition: coherent/clear Progress: Gaining insight Response: Patient voiced no concerns at this time, support provided Plan: patient will be encouraged to continue treatment process  Patients Problems:  Patient Active Problem List   Diagnosis Date Noted   Alcohol use disorder 11/02/2023   Vaginal delivery 05/07/2014   Post-dates pregnancy 05/04/2014   Marijuana abuse 10/17/2013   Supervision of normal pregnancy in first trimester 10/09/2013   Overweight 07/27/2009   Hearing loss 07/27/2009   ECZEMA 07/27/2009

## 2023-11-05 NOTE — ED Notes (Signed)
 Pt is in the dayroom watching TV with peers. Pt denies SI/HI/AVH. Pt has no further complain.No acute distress noted. Will continue to monitor for safety and provide support.

## 2023-11-05 NOTE — ED Notes (Signed)
 Patient sitting in bedroom, calm and composed. No acute distress noted. No concerns voiced. Informed patient to notify staff with any needs or assistance. Patient verbalized understanding or agreement. Safety checks in place per facility policy.
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
## 2023-11-05 NOTE — ED Notes (Signed)
 Patient is sleeping. Respirations equal and unlabored, skin warm and dry. No change in assessment or acuity. Routine safety checks conducted according to facility protocol. Will continue to monitor for safety.

## 2023-11-06 ENCOUNTER — Encounter (HOSPITAL_COMMUNITY): Payer: Self-pay | Admitting: Psychiatry

## 2023-11-06 DIAGNOSIS — F1410 Cocaine abuse, uncomplicated: Secondary | ICD-10-CM | POA: Diagnosis not present

## 2023-11-06 DIAGNOSIS — Z560 Unemployment, unspecified: Secondary | ICD-10-CM | POA: Diagnosis not present

## 2023-11-06 DIAGNOSIS — F10230 Alcohol dependence with withdrawal, uncomplicated: Secondary | ICD-10-CM | POA: Diagnosis not present

## 2023-11-06 DIAGNOSIS — F32A Depression, unspecified: Secondary | ICD-10-CM | POA: Diagnosis not present

## 2023-11-06 MED ORDER — ADULT MULTIVITAMIN W/MINERALS CH: 1.0000 | ORAL_TABLET | Freq: Every day | ORAL | Status: DC

## 2023-11-06 MED ORDER — TRAZODONE HCL 50 MG PO TABS: 50.0000 mg | ORAL_TABLET | Freq: Every evening | ORAL | 0 refills | Status: DC | PRN

## 2023-11-06 MED ORDER — SERTRALINE HCL 50 MG PO TABS: 50.0000 mg | ORAL_TABLET | Freq: Every day | ORAL | 0 refills | Status: DC

## 2023-11-06 MED ORDER — VITAMIN B-1 100 MG PO TABS: 100.0000 mg | ORAL_TABLET | Freq: Every day | ORAL | Status: DC

## 2023-11-06 MED ORDER — NALTREXONE HCL 50 MG PO TABS: 50.0000 mg | ORAL_TABLET | Freq: Every day | ORAL | 0 refills | Status: DC

## 2023-11-06 NOTE — ED Provider Notes (Signed)
 [facility] ASAP Discharge Summary  Date and Time: 11/06/2023 11:20 AM  Name: Carla Rollins  MRN:  [id number]   Discharge Diagnoses:  Final diagnoses:  Alcohol dependence with uncomplicated withdrawal (HCC)  Substance induced mood disorder (HCC)  Cocaine abuse (HCC)    Subjective:  Patient evaluated on the unit. Reports sleep has been good. Reports appetite has been good. States mood is "pretty good" today. 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 Patient reports withdrawal symptoms from alcohol have been "cooling down", she reports she is no longer having shakes or sweats. Mother reports she is concerned about her son, he was supposed to be with his father but has been told that he has been staying with relatives on that side of the family. On interview, suicidal ideations are not present. Thoughts of self harm are not present. Homicidal ideations are not present.    There are no auditory hallucinations, visual hallucinations, paranoid ideations, or delusional thought processes. Side effects to currently prescribed medications are none. There are no somatic complaints. Reports regular bowel movements.. There are no withdrawals reported today.   Stay Summary:  Carla Rollins is a 28 year old female who presented to [facility] on [facility]/23 accompanied by her father seeking alcohol and cocaine detox as well as treatment for depressive symptoms. Patient was evaluated by [name], NP and recommended for admission to [facility]. At the time of admission, the patient complained of chest pain and was consequently transferred to [facility] ED for medical clearance.   During the patient's hospitalization at the [facility], patient had extensive initial psychiatric evaluation, with daily follow-up assessments focused on detoxification management.  Psychiatric diagnoses provided upon initial assessment:  Alcohol  dependence with uncomplicated withdrawal Substance-induced mood disorder Cocaine abuse  Patient's medications adjusted during hospitalization:  Started and continued sertraline 50 mg qdaily;  Started and continued naltrexone 50 mg qdaily,  Patient completed ativan taper and CIWAs were 0 by discharge.   Patient's care was discussed during the interdisciplinary team meeting every day during the hospitalization.  The patient denies having side effects to prescribed psychiatric medication.  Gradually, patient started adjusting to milieu. The patient was evaluated each day by a clinical provider to ascertain response to treatment. Improvement was noted by the patient's report of decreasing symptoms, improved sleep and appetite, affect, medication tolerance, behavior, and participation in unit programming.  Patient was asked each day to complete a self inventory noting mood, mental status, pain, new symptoms, anxiety and concerns.    Symptoms were reported as significantly decreased or resolved completely by discharge.   On day of discharge, the patient reports that their mood is stable. The patient denied having suicidal thoughts for more than 48 hours prior to discharge.  Patient denies having homicidal thoughts.  Patient denies having auditory hallucinations.  Patient denies any visual hallucinations or other symptoms of psychosis. The patient was motivated to continue taking medication with a goal of continued improvement in mental health.   The patient reported that their withdrawal symptoms and cravings responded well to the detox regimen, with overall benefit from the detox program. Supportive psychotherapy was provided, and the patient participated in regular group therapy sessions focused on managing cravings and withdrawal. Coping skills, problem-solving, and relaxation techniques were also part of the program's therapeutic interventions.  Labs were reviewed with the patient, and abnormal  results were discussed with the  patient.  The patient is able to verbalize their individual safety plan to this provider.  # It is recommended to the patient to continue psychiatric medications as prescribed, after discharge from the hospital.    # It is recommended to the patient to follow up with their outpatient psychiatric provider and PCP.  # It was discussed with the patient, the impact of alcohol, drugs, tobacco have been there overall psychiatric and medical wellbeing, and total abstinence from substance use was recommended.  # Prescriptions provided or sent directly to preferred pharmacy at discharge. Patient agreeable to plan. Given opportunity to ask questions. Appears to feel comfortable with discharge.    # In the event of worsening symptoms, the patient is instructed to call the crisis hotline, 911 and or go to the nearest ED for appropriate evaluation and treatment of symptoms. To follow-up with primary care provider for other medical issues, concerns and or health care needs  # Patient was discharged Home with a plan to follow up as noted below.     Total Time spent with patient: 1.5 hours Past Psychiatric History:  Report history of Depression that has never been treated    Past Medical History: Asthma Social History: born in [place] and raised between [place] and [place]. She denies any issues with walking or talking as a child. She stopped going to school in the 11th grade. She is currently single and she has one son. She is unemployed and currently homeless but she does have family she may be able to stay with. She reports previous criminal charges. Social History: born in [place] and raised between [place] and [place]. She denies any issues with walking or talking as a child. She stopped going to school in the 11th grade. She is currently single and she has one son. She is unemployed and currently homeless but she does have family she may be able to stay  with. She reports previous criminal charges.   Current Medications:  Current Facility-Administered Medications  Medication Dose Route Frequency Provider Last Rate Last Admin   acetaminophen (TYLENOL) tablet 650 mg  650 mg Oral Q6H PRN Howie Ill, NP   650 mg at 11/05/23 0917   alum & mag hydroxide-simeth (MAALOX/MYLANTA) 200-200-20 MG/5ML suspension 30 mL  30 mL Oral Q4H PRN Howie Ill, NP       haloperidol (HALDOL) tablet 5 mg  5 mg Oral TID PRN Howie Ill, NP       And   diphenhydrAMINE (BENADRYL) capsule 50 mg  50 mg Oral TID PRN Howie Ill, NP       haloperidol lactate (HALDOL) injection 5 mg  5 mg Intramuscular TID PRN Howie Ill, NP       And   diphenhydrAMINE (BENADRYL) injection 50 mg  50 mg Intramuscular TID PRN Howie Ill, NP       haloperidol lactate (HALDOL) injection 10 mg  10 mg Intramuscular TID PRN Howie Ill, NP       And   diphenhydrAMINE (BENADRYL) injection 50 mg  50 mg Intramuscular TID PRN Howie Ill, NP       hydrOXYzine (ATARAX) tablet 25 mg  25 mg Oral Q6H PRN [name] C, NP       loperamide (IMODIUM) capsule 2-4 mg  2-4 mg Oral PRN Howie Ill, NP       LORazepam (ATIVAN) tablet 1 mg  1 mg Oral BID Clovis Riley, Jerrell L, DO  1 mg at 11/06/23 2130   LORazepam (ATIVAN) tablet 1 mg  1 mg Oral Once Neville Route L, DO       LORazepam (ATIVAN) tablet 1 mg  1 mg Oral Q6H PRN Howie Ill, NP       magnesium hydroxide (MILK OF MAGNESIA) suspension 30 mL  30 mL Oral Daily PRN Howie Ill, NP       multivitamin with minerals tablet 1 tablet  1 tablet Oral Daily Clovis Riley, Jerrell L, DO   1 tablet at 11/06/23 8657   naltrexone (DEPADE) tablet 50 mg  50 mg Oral Daily Miguel Rota, MD   50 mg at 11/06/23 0923   ondansetron (ZOFRAN-ODT) disintegrating tablet 4 mg  4 mg Oral Q6H PRN Howie Ill, NP       sertraline (ZOLOFT) tablet 50 mg  50 mg Oral Daily Zouev, Dmitri, MD   50 mg at 11/06/23 8469   thiamine (VITAMIN  B1) injection 100 mg  100 mg Intramuscular Once [name] C, NP       thiamine (VITAMIN B1) tablet 100 mg  100 mg Oral Daily Clovis Riley, Jerrell L, DO   100 mg at 11/06/23 6295   traZODone (DESYREL) tablet 50 mg  50 mg Oral QHS PRN Howie Ill, NP   50 mg at 11/05/23 2108   Current Outpatient Medications  Medication Sig Dispense Refill   naproxen (NAPROSYN) 250 MG tablet Take 250 mg by mouth 2 (two) times daily as needed.     [START ON 11/07/2023] Multiple Vitamin (MULTIVITAMIN WITH MINERALS) TABS tablet Take 1 tablet by mouth daily.     [START ON 11/07/2023] naltrexone (DEPADE) 50 MG tablet Take 1 tablet (50 mg total) by mouth daily. 30 tablet 0   [START ON 11/07/2023] sertraline (ZOLOFT) 50 MG tablet Take 1 tablet (50 mg total) by mouth daily. 30 tablet 0   [START ON 11/07/2023] thiamine (VITAMIN B-1) 100 MG tablet Take 1 tablet (100 mg total) by mouth daily.     traZODone (DESYREL) 50 MG tablet Take 1 tablet (50 mg total) by mouth at bedtime as needed for sleep. 30 tablet 0    PTA Medications:  PTA Medications  Medication Sig   [START ON 11/07/2023] Multiple Vitamin (MULTIVITAMIN WITH MINERALS) TABS tablet Take 1 tablet by mouth daily.   [START ON 11/07/2023] thiamine (VITAMIN B-1) 100 MG tablet Take 1 tablet (100 mg total) by mouth daily.   [START ON 11/07/2023] naltrexone (DEPADE) 50 MG tablet Take 1 tablet (50 mg total) by mouth daily.   [START ON 11/07/2023] sertraline (ZOLOFT) 50 MG tablet Take 1 tablet (50 mg total) by mouth daily.   traZODone (DESYREL) 50 MG tablet Take 1 tablet (50 mg total) by mouth at bedtime as needed for sleep.   Facility Ordered Medications  Medication   acetaminophen (TYLENOL) tablet 650 mg   alum & mag hydroxide-simeth (MAALOX/MYLANTA) 200-200-20 MG/5ML suspension 30 mL   magnesium hydroxide (MILK OF MAGNESIA) suspension 30 mL   haloperidol (HALDOL) tablet 5 mg   And   diphenhydrAMINE (BENADRYL) capsule 50 mg   haloperidol lactate (HALDOL) injection 5 mg   And    diphenhydrAMINE (BENADRYL) injection 50 mg   haloperidol lactate (HALDOL) injection 10 mg   And   diphenhydrAMINE (BENADRYL) injection 50 mg   traZODone (DESYREL) tablet 50 mg   [COMPLETED] thiamine (VITAMIN B1) injection 100 mg   thiamine (VITAMIN B1) tablet 100 mg   multivitamin with minerals tablet 1 tablet   [  COMPLETED] LORazepam (ATIVAN) tablet 1 mg   [COMPLETED] LORazepam (ATIVAN) tablet 1 mg   LORazepam (ATIVAN) tablet 1 mg   LORazepam (ATIVAN) tablet 1 mg   [COMPLETED] benzocaine (ORAJEL) 10 % mucosal gel 1 Application   sertraline (ZOLOFT) tablet 50 mg   naltrexone (DEPADE) tablet 50 mg   thiamine (VITAMIN B1) injection 100 mg   LORazepam (ATIVAN) tablet 1 mg   hydrOXYzine (ATARAX) tablet 25 mg   loperamide (IMODIUM) capsule 2-4 mg   ondansetron (ZOFRAN-ODT) disintegrating tablet 4 mg       11/06/2023   10:32 AM  Depression screen PHQ 2/9  Decreased Interest 0  Down, Depressed, Hopeless 0  PHQ - 2 Score 0    Flowsheet Row ED from 11/03/2023 in [facility] Most recent reading at 11/03/2023  5:14 AM ED from 11/02/2023 in [facility] at [facility] Most recent reading at 11/02/2023  6:59 PM ED from 11/02/2023 in [facility] Most recent reading at 11/02/2023  3:42 PM  C-SSRS RISK CATEGORY No Risk No Risk No Risk       Musculoskeletal  Strength & Muscle Tone: within normal limits Gait & Station: normal Patient leans: N/A  Psychiatric Specialty Exam  Presentation  General Appearance:  Appropriate for Environment  Eye Contact: Good  Speech: Clear and Coherent; Normal Rate  Speech Volume: Normal  Handedness: -- (not assessed)   Mood and Affect  Mood: Euthymic  Affect: Congruent; Full Range   Thought Process  Thought Processes: Linear  Descriptions of Associations:Intact  Orientation:None  Thought Content:Logical     Hallucinations:Hallucinations:  None  Ideas of Reference:None  Suicidal Thoughts:Suicidal Thoughts: No  Homicidal Thoughts:Homicidal Thoughts: No   Sensorium  Memory: Immediate Good; Recent Good; Remote Good  Judgment: Fair  Insight: Fair   Art therapist  Concentration: Good  Attention Span: Good  Recall: Good  Fund of Knowledge: Good  Language: Good   Psychomotor Activity  Psychomotor Activity:Psychomotor Activity: Normal   Assets  Assets: Desire for Improvement; Resilience; Communication Skills   Sleep  Sleep:Sleep: Good   No data recorded  Physical Exam  Physical Exam Vitals and nursing note reviewed.  Constitutional:      General: She is not in acute distress.    Appearance: She is not ill-appearing.  HENT:     Head: Normocephalic and atraumatic.  Eyes:     Extraocular Movements: Extraocular movements intact.     Conjunctiva/sclera: Conjunctivae normal.  Pulmonary:     Effort: Pulmonary effort is normal. No respiratory distress.  Skin:    General: Skin is warm and dry.  Neurological:     General: No focal deficit present.    Review of Systems  All other systems reviewed and are negative.  Blood pressure 112/71, pulse 75, temperature 97.7 F (36.5 C), temperature source Oral, resp. rate 18, last menstrual period 10/19/2023, SpO2 100%, unknown if currently breastfeeding. There is no height or weight on file to calculate BMI.  Demographic Factors:  Adolescent or young adult and Low socioeconomic status  Loss Factors: Financial problems/change in socioeconomic status  Historical Factors: Impulsivity  Risk Reduction Factors:   Positive therapeutic relationship and Positive coping skills or problem solving skills  Continued Clinical Symptoms:  Alcohol/Substance Abuse/Dependencies  Cognitive Features That Contribute To Risk:  None    Suicide Risk:  Moderate:  Frequent suicidal ideation with limited intensity, and duration, some specificity in terms  of plans, no associated intent, good self-control,  limited dysphoria/symptomatology, some risk factors present, and identifiable protective factors, including available and accessible social support.  Plan Of Care/Follow-up recommendations:  Activity: as tolerated  Diet: heart healthy  Other: -Follow-up with your outpatient psychiatric provider -instructions on appointment date, time, and address (location) are provided to you in discharge paperwork.  -Take your psychiatric medications as prescribed at discharge - instructions are provided to you in the discharge paperwork    -If you are prescribed an atypical antipsychotic medication, we recommend that your outpatient psychiatrist follow routine screening for side effects within 3 months of discharge, including monitoring: AIMS scale, height, weight, blood pressure, fasting lipid panel, HbA1c, and fasting blood sugar.   -Recommend total abstinence from alcohol, tobacco, and other illicit drug use at discharge.   -If your psychiatric symptoms recur, worsen, or if you have side effects to your psychiatric medications, call your outpatient psychiatric provider, 911, [phone] or go to the nearest emergency department.  -If suicidal thoughts occur, immediately call your outpatient psychiatric provider, 911, [phone] or go to the nearest emergency department.   Disposition: Home  Lorri Frederick, MD 11/06/2023, 11:21 AM

## 2023-11-06 NOTE — ED Notes (Signed)
 Patient is sleeping. Respirations equal and unlabored, skin warm and dry. No change in assessment or acuity. Routine safety checks conducted according to facility protocol. Will continue to monitor for safety.

## 2023-11-06 NOTE — Discharge Instructions (Signed)
 [facility] [address], [place], [address] [phone] phone  New Patient Assessment/Therapy Walk-Ins:  Monday and Wednesday: 8 am until slots are full. Every 1st and 2nd Fridays of the month: 1 pm - 5 pm.  NO ASSESSMENT/THERAPY WALK-INS ON TUESDAYS OR THURSDAYS  New Patient Assessment/Medication Management Walk-Ins:  Monday - Friday:  8 am - 11 am.  For all walk-ins, we ask that you arrive by 7:30 am because patients will be seen in the order of arrival.  Availability is limited; therefore, you may not be seen on the same day that you walk-in.  Our goal is to serve and meet the needs of our community to the best of our [place] ability.  SUBSTANCE USE TREATMENT for Medicaid and State Funded/IPRS  [facility] [address], [place], [address] [phone] phone NOTE: [facility] is no longer offering IOP services.  Serves those who are low-income or have no insurance.  [facility] [address] [place], [place], [address] [phone] phone [phone] fax NOTE: Does have Substance Abuse-Intensive Outpatient Program [facility]) as well as transitional housing if eligible.  [facility] [address] [place], [place], [address] [phone] phone [phone] fax  [facility] [phone] [place], [place], [address] [phone] phone [phone] fax  HALFWAY HOUSES:  [facility] [phone]  [employer].oxfordvacancies.com  12 STEP PROGRAMS:  Alcoholics Anonymous of [place] Software[employer]alet.be  Narcotics Anonymous of [place] [employer]ct.dk  Al-Anon of [place], [place] www.[place]alanon.org/find-meetings.html  Nar-Anon https://nar-anon.org/find-a-meetin  List of Residential placements:   [facility] in [place]: [phone]  [facility]: [phone]  [place], [place]  [phone]: Female and female facility; 30-day program: (uninsured and Medicaid such as Laurena Bering, [place], [place], partners)  [facility]: [phone]; men and women's facility; 28 days; Can have Medicaid tailored plan Tour manager or Partners)  [facility]: [phone] Karoline Caldwell or Larita Fife; 28 day program; must be fully detox; tailored Medicaid or no insurance  [address] in [place], [place]; [phone]; 28 day all males program; no insurance accepted  [facility] Referral in [place]: Gabriel Rung [phone] (no insurance or Medicaid only); 90 days; outpatient services but provide housing in apartments [place]  [facility] Admission: [phone]: Patient must complete phone screening for placement: [place], [place]; 6 month program; uninsured, Medicaid, and [employer].   [facility]: no insurance required; [phone]  [facility]: [phone]; Intake: Molly Maduro; Must fill out application online; Alecia Lemming Delay [phone] x [address] [facility] in [place], [place]: [phone]; Admissions Coordinators Mr. Maurine Minister or Barron Alvine; 90 day program.  [facility]: [place], [place] [phone]; Co-Ed 9 month to a year program; Online application; Men entry fee is $500 (6-53months);  [employer]: [address] [place], [place] [address]; no fee or insurance required; minimum of 2 years; Highly structured; work based; Intake Coordinator is Thayer Ohm [phone]  [facility] in [place], [place]: [phone]; Fax number is [phone]; website: www.[facility].org; Requires 3-6 page autobiography; 2 year program (18 months and then 25month transitional housing); Admission fee is $300; no insurance needed; work Automotive engineer in [place], [place]: [employer] Desk Staff: Danise Edge [phone]: They have a Men's Regenerations Program 6-39months. Free program; There is an initial $300 fee however, they are willing to work  with patients regarding that. Application is online.  [facility]: Admissions [phone] Doran Heater ext [phone]; Any 7-90 day program is out of pocket; 12  month program is free of charge; there is a $275 entry fee; Patient is responsible for own transportation

## 2023-11-07 ENCOUNTER — Telehealth (HOSPITAL_COMMUNITY): Payer: Self-pay

## 2023-11-07 ENCOUNTER — Ambulatory Visit (HOSPITAL_COMMUNITY)

## 2023-11-07 NOTE — Telephone Encounter (Signed)
 Therapist reached out to Pease who was a no show for her CCA today. She answers the phone and therapist confirms she is speaking to the correct person by obtaining two identifiers.  Therapist inquired if she was all right as she missed her appointment today. Carla Rollins says she forgot.  She asked to be rescheduled. She was given an appointment on 11-12-23 at 9am.  Remigio Eisenmenger, MS, LMFT, LCAS

## 2023-11-07 NOTE — Progress Notes (Deleted)
 Cardiology Office Note:    Date:  11/07/2023   ID:  Carla Rollins, DOB 1995/10/12, MRN [id number]  PCP:  Patient, No Pcp Per   [facility] Providers Cardiologist:  None { Click to update primary MD,subspecialty MD or APP then REFRESH:1}    Referring MD: Sabas Sous, MD   No chief complaint on file. ***  History of Present Illness:    Carla Rollins is a 28 y.o. female with a hx of alcohol dependence, cocaine abuse, and substance induced mood disorder.  She was recently hospitalized 11/03/2023 for admission to [facility] for alcohol and cocaine detox.  Prior to admission she was noted to have an abnormal EKG.  When asked about chest discomfort she reported discomfort in her chest when she was crying on the day before.  EKG with TWI I, AVR, AVL, V1, V2, iRBBB, no prior for comparison.   ?Was in the setting of intoxication.   Repeat EKG  Family history   Chest discomfort Abnormal EKG - recommend echocardiogram - evaluate EKG changes and chest discomfort  Risk factors for ACS     Past Medical History:  Diagnosis Date   Asthma    Chlamydia 2013   Infection     Past Surgical History:  Procedure Laterality Date   NO PAST SURGERIES      Current Medications: No outpatient medications have been marked as taking for the 11/09/23 encounter (Appointment) with Marcelino Duster, PA.     Allergies:   Patient has no known allergies.   Social History   Socioeconomic History   Marital status: Single    Spouse name: Not on file   Number of children: Not on file   Years of education: Not on file   Highest education level: Not on file  Occupational History   Not on file  Tobacco Use   Smoking status: Every Day    Types: Cigarettes   Smokeless tobacco: Never  Substance and Sexual Activity   Alcohol use: No   Drug use: No   Sexual activity: Yes    Birth control/protection: None    Comment: preg  Other Topics Concern   Not on file  Social History Narrative    Not on file   Social Drivers of Health   Financial Resource Strain: Not on file  Food Insecurity: Food Insecurity Present (11/03/2023)   Hunger Vital Sign    Worried About Running Out of Food in the Last Year: Sometimes true    Ran Out of Food in the Last Year: Sometimes true  Transportation Needs: Unmet Transportation Needs (11/03/2023)   PRAPARE - Administrator, Civil Service (Medical): Yes    Lack of Transportation (Non-Medical): Yes  Physical Activity: Not on file  Stress: Not on file  Social Connections: Not on file     Family History: The patient's ***family history includes Cancer in her maternal grandfather; Diabetes in her maternal grandmother.  ROS:   Please see the history of present illness.    *** All other systems reviewed and are negative.  EKGs/Labs/Other Studies Reviewed:    The following studies were reviewed today: ***      Recent Labs: 11/02/2023: ALT 33; BUN 6; Creatinine, Ser 0.75; Hemoglobin 12.9; Magnesium 2.1; Platelets 390; Potassium 3.4; Sodium 140; TSH 1.609  Recent Lipid Panel    Component Value Date/Time   CHOL 197 11/02/2023 1923   TRIG 74 11/02/2023 1923   HDL 80 11/02/2023 1923   CHOLHDL 2.5 11/02/2023  1923   VLDL 15 11/02/2023 1923   LDLCALC 102 (H) 11/02/2023 1923     Risk Assessment/Calculations:   {Does this patient have ATRIAL FIBRILLATION?:[phone]}            Physical Exam:    VS:  LMP 10/19/2023 (Within Days)     Wt Readings from Last 3 Encounters:  03/19/19 190 lb (86.2 kg)  11/16/18 180 lb (81.6 kg)  08/25/18 180 lb (81.6 kg)     GEN: *** Well nourished, well developed in no acute distress HEENT: Normal NECK: No JVD; No carotid bruits LYMPHATICS: No lymphadenopathy CARDIAC: ***RRR, no murmurs, rubs, gallops RESPIRATORY:  Clear to auscultation without rales, wheezing or rhonchi  ABDOMEN: Soft, non-tender, non-distended MUSCULOSKELETAL:  No edema; No deformity  SKIN: Warm and dry NEUROLOGIC:   Alert and oriented x 3 PSYCHIATRIC:  Normal affect   ASSESSMENT:    No diagnosis found. PLAN:    In order of problems listed above:  ***      {Are you ordering a CV Procedure (e.g. stress test, cath, DCCV, TEE, etc)?   Press F2        :[id number]}    Medication Adjustments/Labs and Tests Ordered: Current medicines are reviewed at length with the patient today.  Concerns regarding medicines are outlined above.  No orders of the defined types were placed in this encounter.  No orders of the defined types were placed in this encounter.   There are no Patient Instructions on file for this visit.   Signed, Roe Rutherford Rosalita Carey, PA  11/07/2023 3:00 PM    [facility]

## 2023-11-09 ENCOUNTER — Ambulatory Visit: Admitting: Physician Assistant

## 2023-11-12 ENCOUNTER — Ambulatory Visit (INDEPENDENT_AMBULATORY_CARE_PROVIDER_SITE_OTHER)

## 2023-11-12 ENCOUNTER — Ambulatory Visit (HOSPITAL_COMMUNITY)
Admission: EM | Admit: 2023-11-12 | Discharge: 2023-11-13 | Disposition: A | Discharge status: Other Acute Inpatient Hospital | Attending: Psychiatry | Admitting: Psychiatry

## 2023-11-12 DIAGNOSIS — F1020 Alcohol dependence, uncomplicated: Secondary | ICD-10-CM

## 2023-11-12 DIAGNOSIS — F1994 Other psychoactive substance use, unspecified with psychoactive substance-induced mood disorder: Secondary | ICD-10-CM

## 2023-11-12 DIAGNOSIS — F1420 Cocaine dependence, uncomplicated: Secondary | ICD-10-CM

## 2023-11-12 DIAGNOSIS — F1090 Alcohol use, unspecified, uncomplicated: Secondary | ICD-10-CM

## 2023-11-12 DIAGNOSIS — F32A Depression, unspecified: Secondary | ICD-10-CM | POA: Insufficient documentation

## 2023-11-12 LAB — URINALYSIS, COMPLETE (UACMP) WITH MICROSCOPIC
Bilirubin Urine: NEGATIVE
Glucose, UA: NEGATIVE mg/dL
Ketones, ur: NEGATIVE mg/dL
Leukocytes,Ua: NEGATIVE
Nitrite: POSITIVE — AB
Protein, ur: NEGATIVE mg/dL
Specific Gravity, Urine: 1.027 (ref 1.005–1.030)
pH: 5 (ref 5.0–8.0)

## 2023-11-12 LAB — COMPREHENSIVE METABOLIC PANEL
ALT: 27 U/L (ref 0–44)
AST: 41 U/L (ref 15–41)
Albumin: 4.1 g/dL (ref 3.5–5.0)
Alkaline Phosphatase: 54 U/L (ref 38–126)
Anion gap: 9 (ref 5–15)
BUN: 7 mg/dL (ref 6–20)
CO2: 25 mmol/L (ref 22–32)
Calcium: 9.7 mg/dL (ref 8.9–10.3)
Chloride: 105 mmol/L (ref 98–111)
Creatinine, Ser: 0.76 mg/dL (ref 0.44–1.00)
GFR, Estimated: >60 mL/min (ref >60)
Glucose, Bld: 80 mg/dL (ref 70–99)
Potassium: 4.3 mmol/L (ref 3.5–5.1)
Sodium: 139 mmol/L (ref 135–145)
Total Bilirubin: 0.6 mg/dL (ref 0.0–1.2)
Total Protein: 7.5 g/dL (ref 6.5–8.1)

## 2023-11-12 LAB — CBC WITH DIFFERENTIAL/PLATELET
Abs Immature Granulocytes: 0.02 10*3/uL (ref 0.00–0.07)
Basophils Absolute: 0.1 10*3/uL (ref 0.0–0.1)
Basophils Relative: 1 %
Eosinophils Absolute: 0.5 10*3/uL (ref 0.0–0.5)
Eosinophils Relative: 5 %
HCT: 41.6 % (ref 36.0–46.0)
Hemoglobin: 14.4 g/dL (ref 12.0–15.0)
Immature Granulocytes: 0 %
Lymphocytes Relative: 45 %
Lymphs Abs: 3.9 10*3/uL (ref 0.7–4.0)
MCH: 33.6 pg (ref 26.0–34.0)
MCHC: 34.6 g/dL (ref 30.0–36.0)
MCV: 97.2 fL (ref 80.0–100.0)
Monocytes Absolute: 0.5 10*3/uL (ref 0.1–1.0)
Monocytes Relative: 5 %
Neutro Abs: 3.9 10*3/uL (ref 1.7–7.7)
Neutrophils Relative %: 44 %
Platelets: 364 10*3/uL (ref 150–400)
RBC: 4.28 MIL/uL (ref 3.87–5.11)
RDW: 12.1 % (ref 11.5–15.5)
WBC: 8.9 10*3/uL (ref 4.0–10.5)
nRBC: 0 % (ref 0.0–0.2)

## 2023-11-12 LAB — POCT URINE DRUG SCREEN - MANUAL ENTRY (I-SCREEN)
POC Amphetamine UR: NOT DETECTED
POC Buprenorphine (BUP): NOT DETECTED
POC Cocaine UR: POSITIVE — AB
POC Marijuana UR: POSITIVE — AB
POC Methadone UR: NOT DETECTED
POC Methamphetamine UR: NOT DETECTED
POC Morphine: NOT DETECTED
POC Oxazepam (BZO): NOT DETECTED
POC Oxycodone UR: NOT DETECTED
POC Secobarbital (BAR): NOT DETECTED

## 2023-11-12 LAB — POC URINE PREG, ED: Preg Test, Ur: NEGATIVE

## 2023-11-12 LAB — ETHANOL: Alcohol, Ethyl (B): <10 mg/dL (ref <10)

## 2023-11-12 MED ORDER — MAGNESIUM HYDROXIDE 400 MG/5ML PO SUSP: 30.0000 mL | Freq: Every day | ORAL | Status: DC | PRN

## 2023-11-12 MED ORDER — SERTRALINE HCL 50 MG PO TABS
50.0000 mg | ORAL_TABLET | Freq: Every day | ORAL | Status: DC
  Administered 2023-11-12 – 2023-11-13 (×2): 50 mg via ORAL
  Filled 2023-11-12 (×2): qty 1

## 2023-11-12 MED ORDER — LORAZEPAM 2 MG/ML IJ SOLN: 2.0000 mg | Freq: Three times a day (TID) | INTRAMUSCULAR | Status: DC | PRN

## 2023-11-12 MED ORDER — DIPHENHYDRAMINE HCL 50 MG PO CAPS: 50.0000 mg | ORAL_CAPSULE | Freq: Three times a day (TID) | ORAL | Status: DC | PRN

## 2023-11-12 MED ORDER — TRAZODONE HCL 50 MG PO TABS: 50.0000 mg | ORAL_TABLET | Freq: Every evening | ORAL | Status: DC | PRN

## 2023-11-12 MED ORDER — THIAMINE MONONITRATE 100 MG PO TABS
100.0000 mg | ORAL_TABLET | Freq: Every day | ORAL | Status: DC
Start: 2023-11-13 — End: 2023-11-13
  Administered 2023-11-13: 100 mg via ORAL
  Filled 2023-11-12: qty 1

## 2023-11-12 MED ORDER — HALOPERIDOL 5 MG PO TABS
5.0000 mg | ORAL_TABLET | Freq: Three times a day (TID) | ORAL | Status: DC | PRN
Start: 2023-11-12 — End: 2023-11-13

## 2023-11-12 MED ORDER — ONDANSETRON 4 MG PO TBDP: 4.0000 mg | ORAL_TABLET | Freq: Four times a day (QID) | ORAL | Status: DC | PRN

## 2023-11-12 MED ORDER — ADULT MULTIVITAMIN W/MINERALS CH
1.0000 | ORAL_TABLET | Freq: Every day | ORAL | Status: DC
  Administered 2023-11-12 – 2023-11-13 (×2): 1 via ORAL
  Filled 2023-11-12 (×2): qty 1

## 2023-11-12 MED ORDER — HALOPERIDOL LACTATE 5 MG/ML IJ SOLN: 5.0000 mg | Freq: Three times a day (TID) | INTRAMUSCULAR | Status: DC | PRN

## 2023-11-12 MED ORDER — ALUM & MAG HYDROXIDE-SIMETH 200-200-20 MG/5ML PO SUSP: 30.0000 mL | ORAL | Status: DC | PRN

## 2023-11-12 MED ORDER — ACETAMINOPHEN 325 MG PO TABS: 650.0000 mg | ORAL_TABLET | Freq: Four times a day (QID) | ORAL | Status: DC | PRN

## 2023-11-12 MED ORDER — LORAZEPAM 1 MG PO TABS: 1.0000 mg | ORAL_TABLET | Freq: Four times a day (QID) | ORAL | Status: DC | PRN

## 2023-11-12 MED ORDER — THIAMINE HCL 100 MG/ML IJ SOLN
100.0000 mg | Freq: Once | INTRAMUSCULAR | Status: AC
  Administered 2023-11-12: 100 mg via INTRAMUSCULAR
  Filled 2023-11-12: qty 2

## 2023-11-12 MED ORDER — HALOPERIDOL LACTATE 5 MG/ML IJ SOLN: 10.0000 mg | Freq: Three times a day (TID) | INTRAMUSCULAR | Status: DC | PRN

## 2023-11-12 MED ORDER — LOPERAMIDE HCL 2 MG PO CAPS: 2.0000 mg | ORAL_CAPSULE | ORAL | Status: DC | PRN

## 2023-11-12 MED ORDER — HYDROXYZINE HCL 25 MG PO TABS
25.0000 mg | ORAL_TABLET | Freq: Four times a day (QID) | ORAL | Status: DC | PRN
Start: 2023-11-12 — End: 2023-11-15

## 2023-11-12 MED ORDER — DIPHENHYDRAMINE HCL 50 MG/ML IJ SOLN: 50.0000 mg | Freq: Three times a day (TID) | INTRAMUSCULAR | Status: DC | PRN
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
## 2023-11-12 NOTE — ED Notes (Signed)
 Patient admitted to Adult Obs for alcohol detox. She is alert, oriented, calm, and cooperative. She denies SI/HI or AVH. Patient agreed to alert staff should thoughts of self harm arise. She denies physical pain or discomfort. Skin check conducted by the nurse and Marchelle Folks, MHT. Patient oriented to the unit. No additional needs noted. We will continue to monitor for safety.

## 2023-11-12 NOTE — Progress Notes (Deleted)
 Carla Rollins presents today as she was scheduled for a CCA to begin SAIOP.  Carla Rollins says she knows she told the [facility] downstairs that she wanted SAIOP but she says he needs residential.  Carla Rollins says that she drank 1/5 of liquor last night and her last drink was at 2 am this morning.  Name of Substance: Alcohol Age of first use: age 28 Amount using: 1 fifth of tequila a day Frequency of use: Daily  How long using: used less until a year ago and begin using 1/5 of tequilla per day.  13 years Last used: 2 am.  Finished a fifth of tequilla then Withdrawal symptoms: none yet Longest period of sobriety: A few days   Name of Substance: Cocaine Age of first use: 28 yrs old Amount using: $20 worth or 1/2 gram Frequency of use: Daily How long using: 4-5  years Last used: about 10pm last night. (11-11-23) Withdrawal symptoms: none yet Longest period of sobriety: A few days  ASAM's:  Six Dimensions of Multidimensional Assessment   Dimension 1:  Acute Intoxication and/or Withdrawal Potential:   Last drink was at 2am this morning (3)  Dimension 2:  Biomedical Conditions (0)  Dimension 3:  Emotional, Behavioral, or Cognitive Conditions and Complications:  1  Dimension 4:  Readiness to Change:  Dimension 4:  Description of Readiness to Change criteria: Reports she is willing to engage in treatment  Dimension 5:  Relapse, Continued use, or Continued Problem Potential:  4  Dimension 6:  Recovery/Living Environment: 4  ASAM Severity Score: ASAM's Severity Rating Score: 12  ASAM Recommended Level of Treatment: Level II residential    Carla Rollins says she has never had any counseling for her substance use, nor residential.   PHQ9 =18.  Onset was approximately one year ago.  She says the psychosocial stressor at that time was she had to move out of the house she and her son.  Carla Rollins says he is staying with his Dad while she "gets it together".  She is currently living with her sister. She reports this is not going  "too good".   GAD-0=15  Onset was approximately one year ago.  She reports the anxiety onset when the depressive sx did and for the same reasons.   DX:  Alcohol Use Disorder, Severe         Cocaine Use Disorder, Severe         Substance Induced Mood Disorder   Chart notes indicate Carla Rollins was accepted to [facility] when she was in [facility] but changed her mind.  Therapist calls [facility] back after Carla Rollins reports she drank a fifth of tequila last night.  Carla Rollins said Carla Rollins would need to go to detox first and detox can contact them afterwards.  Remigio Eisenmenger, LMFT, LCAS 11-12-23

## 2023-11-12 NOTE — ED Notes (Signed)
 Pt is laying on recliner resting she was awaken to answer questions she denies SI/HI/AVH she wasn't a long term residential facility will continue to monitor for safety

## 2023-11-12 NOTE — Progress Notes (Signed)
   11/12/23 1024  [facility] Triage Screening (Walk-ins at [facility] only)  How Did You Hear About [place]? Self  What Is the Reason for Your Visit/Call Today? Carla Rollins is a 28 year old female presenting to [facility] unaccompanied. Pt was here a few weeks ago and is back requesting detox. Pt reports she drank a bottle of alcohol and a 20 of cocaine. Pt reports she is using both substances daily. Pt reports using cocaine for 4 years. Pt reports she is needing a place to stay for detox. Pt mentions outpatient services were not helpful because she needs to detox first. Pt is not currently taking medication at this time or seeking therapy. Pt denies Si, Hi and Avh.  How Long Has This Been Causing You Problems? > than 6 months  Have You Recently Had Any Thoughts About Hurting Yourself? No  Are You Planning to Commit Suicide/Harm Yourself At This time? No  Have you Recently Had Thoughts About Hurting Someone [name]? No  Are You Planning To Harm Someone At This Time? No  Physical Abuse Denies  Verbal Abuse Denies  Sexual Abuse Denies  Exploitation of patient/patient's resources Denies  Self-Neglect Denies  Possible abuse reported to: Other (Comment)  Are you currently experiencing any auditory, visual or other hallucinations? No  Have You Used Any Alcohol or Drugs in the Past 24 Hours? Yes  What Did You Use and How Much? a bottle of alcohol last night and a 20 of cocaine  Do you have any current medical co-morbidities that require immediate attention? No  What Do You Feel Would Help You the Most Today? Alcohol or Drug Use Treatment  If access to [facility] was not available, would you have sought care in the Emergency Department? No  Determination of Need Urgent (48 hours)  Options For Referral Facility-Based Crisis  Determination of Need filed? Yes

## 2023-11-12 NOTE — ED Provider Notes (Signed)
 [facility] Continuous Assessment Admission H&P  Date: 11/12/23 Patient Name: Carla Rollins MRN: [id number] Chief Complaint: Requesting alcohol detox  Diagnoses:  Final diagnoses:  Substance induced mood disorder (HCC)  Alcohol use disorder    HPI: patient presented to [facility] as a walk in  unaccompanied requesting alcohol detox.    Carla Rollins, 28 y.o., female patient seen face to face by this provider, consulted with Dr. Enedina Finner; and chart reviewed on 11/12/23.  Per chart review patient was admitted to the [facility] 11/03/2023-11/06/2023 and discharged with instructions to follow-up with the [facility] IOP program.  She was prescribed sertraline 50 mg daily, naltrexone 50 mg daily, and trazodone 50 mg nightly.  She is unemployed and lives with her sister.  She has a 28-year-old son who lives with his father.  Patient was referred to [facility] UC from Rubbie Battiest SAIOP at [facility] on second floor, per chart review, "Carla Rollins presents today as she was scheduled for a CCA to begin SAIOP.  Carla Rollins says she knows she told the [facility] downstairs that she wanted SAIOP but she says she needs residential.  Carla Rollins says that she drank 1/5 of liquor last night and her last drink was at 2 am this morning."  On evaluation Carla Rollins reports she was referred SA IOP program, because she told them that she wanted residential treatment.  They contacted [facility] but [facility] states patient will need to complete alcohol detox first.  Aamilah reports completing alcohol detox in the [facility] 11/03/2023-11/06/2023.Marland Kitchen  Upon discharge from the facility based crisis unit she went back into the same crowd of people.  The very next day she relapsed.  She admits to snorting roughly $20 worth of cocaine daily.  She has been drinking about 1/fifth-handle of tequila per day.  She denies any history of alcohol withdrawal seizure or delirium tremor.  She identifies irritability and anxiety as her current alcohol withdrawal symptoms.  Her last alcohol consumption  was this a.m.  She was sent prescriptions to pharmacy but never picked them up.  She endorses daily use of marijuana and cigarettes.  She is requesting residential substance abuse treatment.  She denies any legal issues.  During evaluation Carla Rollins is observed sitting in the assessment room in no acute distress.  She is alert/oriented x 4,cooperative, and attentive.  She appears anxious.  She has normal speech.  She is casually dressed and makes fair eye contact.  She endorses depression with feelings of sadness, isolating, tearfulness, helplessness, hopelessness, worthlessness, guilt and decreased sleep.  She has a depressed affect.  Her biggest stressor/trigger is her substance use, financial problems, homelessness and not having her son.  She denies any suicidal or homicidal ideations.  She denies auditory/visual hallucinations.  She does not appear manic, psychotic, delusional, or paranoid.  She does not appear to be responding to internal/external stimuli.  She is able to answer questions appropriately.  Patient will be admitted to the continuous assessment unit and will be reevaluated by the psychiatry in the a.m. Patient agreeable.  Patient may be a potential candidate for [facility].  Total Time spent with patient: 30 minutes  Musculoskeletal  Strength & Muscle Tone: within normal limits Gait & Station: normal Patient leans: N/A  Psychiatric Specialty Exam  Presentation General Appearance:  Appropriate for Environment  Eye Contact: Good  Speech: Clear and Coherent; Normal Rate  Speech Volume: Normal  Handedness: Right   Mood and Affect  Mood: Anxious; Depressed  Affect: Tearful; Congruent  Thought Process  Thought Processes: Coherent  Descriptions of Associations:Intact  Orientation:Full (Time, Place and Person)  Thought Content:Logical    Hallucinations:Hallucinations: None  Ideas of Reference:None  Suicidal Thoughts:Suicidal Thoughts: No  Homicidal  Thoughts:Homicidal Thoughts: No   Sensorium  Memory: Immediate Good; Recent Good; Remote Good  Judgment: Fair  Insight: Fair   Chartered certified accountant: Fair  Attention Span: Fair  Recall: [employer] of Knowledge: Fair  Language: Fair   Psychomotor Activity  Psychomotor Activity: Psychomotor Activity: Normal   Assets  Assets: Manufacturing systems engineer; Desire for Improvement; Physical Health; Resilience; Social Support   Sleep  Sleep: Sleep: Fair   Nutritional Assessment (For OBS and FBC admissions only) Has the patient had a weight loss or gain of 10 pounds or more in the last 3 months?: No Has the patient had a decrease in food intake/or appetite?: No Does the patient have dental problems?: No Does the patient have eating habits or behaviors that may be indicators of an eating disorder including binging or inducing vomiting?: No Has the patient recently lost weight without trying?: 0 Has the patient been eating poorly because of a decreased appetite?: 0 Malnutrition Screening Tool Score: 0    Physical Exam Vitals reviewed.  Constitutional:      Appearance: Normal appearance.  Eyes:     General:        Right eye: No discharge.        Left eye: No discharge.  Cardiovascular:     Rate and Rhythm: Normal rate.  Pulmonary:     Effort: Pulmonary effort is normal. No respiratory distress.  Musculoskeletal:        General: Normal range of motion.     Cervical back: Normal range of motion.  Skin:    Coloration: Skin is not jaundiced or pale.  Neurological:     Mental Status: She is alert and oriented to person, place, and time.  Psychiatric:        Attention and Perception: Attention and perception normal.        Mood and Affect: Mood is anxious and depressed. Affect is tearful.        Speech: Speech normal.        Behavior: Behavior is cooperative.        Thought Content: Thought content normal.        Cognition and Memory: Cognition  normal.        Judgment: Judgment normal.    Review of Systems  Constitutional:  Negative for chills and fever.  HENT:  Negative for hearing loss.   Respiratory:  Negative for cough and shortness of breath.   Cardiovascular:  Negative for chest pain.  Musculoskeletal: Negative.   Psychiatric/Behavioral:  Positive for depression and substance abuse. The patient is nervous/anxious.     Blood pressure (!) 142/65, pulse 81, temperature 98.8 F (37.1 C), temperature source Oral, resp. rate 19, last menstrual period 10/19/2023, SpO2 99%, unknown if currently breastfeeding. There is no height or weight on file to calculate BMI.  Past Psychiatric History:   Alcohol use disorder completed detox at [facility] discharge 11/06/2023 Depression-was prescribed sertraline 50 mg daily upon discharge from [facility] but did not pick up medication from pharmacy.  Is the patient at risk to self? No  Has the patient been a risk to self in the past 6 months? No .    Has the patient been a risk to self within the distant past? No   Is the patient a  risk to others? No   Has the patient been a risk to others in the past 6 months? No   Has the patient been a risk to others within the distant past? No   Past Medical History:  Past Medical History:  Diagnosis Date   Asthma    Chlamydia 2013   Infection      Family History: Denies  Social History:  Patient reports that she was born in [place] and raised between [place] and [place]. She denies any issues with walking or talking as a child. She stopped going to school in the 11th grade. She is currently single and she has one son. She is unemployed and currently homeless but she does have family she may be able to stay with. She reports previous criminal charges.   Per chart: Name of Substance: Alcohol Age of first use: "Pretty much my whole life" Amount using: 1 bottle of tequila a day Frequency of use: Daily  How long using: 6 months Last used: Today   Withdrawal symptoms: Tremors, sweating, nausea and headaches Longest period of sobriety:   Name of Substance: Cocaine Age of first use: 28 yrs old Amount using: $20 worth Frequency of use: Daily How long using:4 years Last used: Yesterday Withdrawal symptoms: Tiredness Longest period of sobriety: A few days detox in [facility] from 11/03/2023-11/06/2023  Last Labs:  Admission on 11/02/2023, Discharged on 11/03/2023  Component Date Value Ref Range Status   Troponin I (High Sensitivity) 11/02/2023 34 (H)  <18 ng/L Final   Comment: (NOTE) Elevated high sensitivity troponin I (hsTnI) values and significant  changes across serial measurements may suggest ACS but many other  chronic and acute conditions are known to elevate hsTnI results.  Refer to the "Links" section for chest pain algorithms and additional  guidance. Performed at [facility], [address], [place], [place] [address]    Opiates 11/02/2023 NONE DETECTED  NONE DETECTED Final   Cocaine 11/02/2023 POSITIVE (A)  NONE DETECTED Final   Benzodiazepines 11/02/2023 NONE DETECTED  NONE DETECTED Final   Amphetamines 11/02/2023 POSITIVE (A)  NONE DETECTED Final   Comment: (NOTE) Trazodone is metabolized in vivo to several metabolites, including pharmacologically active m-CPP, which is excreted in the urine. Immunoassay screens for amphetamines and MDMA have potential cross-reactivity with these compounds and may provide false positive  results.     Tetrahydrocannabinol 11/02/2023 POSITIVE (A)  NONE DETECTED Final   Barbiturates 11/02/2023 NONE DETECTED  NONE DETECTED Final   Comment: (NOTE) DRUG SCREEN FOR MEDICAL PURPOSES ONLY.  IF CONFIRMATION IS NEEDED FOR ANY PURPOSE, NOTIFY LAB WITHIN 5 DAYS.  LOWEST DETECTABLE LIMITS FOR URINE DRUG SCREEN Drug Class                     Cutoff (ng/mL) Amphetamine and metabolites    1000 Barbiturate and metabolites    200 Benzodiazepine                 200 Opiates and metabolites         300 Cocaine and metabolites        300 THC                            50 Performed at [facility], [address], [place], [place] [address]    hCG, Clement Sayres, Kathie Rhodes 11/02/2023 <1  <5 mIU/mL Final   Comment:  GEST. AGE      CONC.  (mIU/mL)   <=1 WEEK        5 - 50     2 WEEKS       50 - 500     3 WEEKS       100 - 10,000     4 WEEKS     1,000 - 30,000     5 WEEKS     3,500 - 115,000   6-8 WEEKS     12,000 - 270,000    12 WEEKS     15,000 - 220,000        FEMALE AND NON-PREGNANT FEMALE:     LESS THAN 5 mIU/mL Performed at [facility], [address] [place], [place] [address]    Cholesterol 11/02/2023 197  0 - 200 mg/dL Final   Triglycerides 60/45/4098 74  <150 mg/dL Final   HDL 11/91/4782 80  >40 mg/dL Final   Total CHOL/HDL Ratio 11/02/2023 2.5  RATIO Final   VLDL 11/02/2023 15  0 - 40 mg/dL Final   LDL Cholesterol 11/02/2023 102 (H)  0 - 99 mg/dL Final   Comment:        Total Cholesterol/HDL:CHD Risk Coronary Heart Disease Risk Table                     Men   Women  1/2 Average Risk   3.4   3.3  Average Risk       5.0   4.4  2 X Average Risk   9.6   7.1  3 X Average Risk  23.4   11.0        Use the calculated Patient Ratio above and the CHD Risk Table to determine the patient's CHD Risk.        ATP III CLASSIFICATION (LDL):  <100     mg/dL   Optimal  956-213  mg/dL   Near or Above                    Optimal  130-159  mg/dL   Borderline  086-578  mg/dL   High  >469     mg/dL   Very High Performed at [facility], [address] [place], [place] [address]    TSH 11/02/2023 1.609  0.350 - 4.500 uIU/mL Final   Comment: Performed by a 3rd Generation assay with a functional sensitivity of <=0.01 uIU/mL. Performed at [facility], [address], [place], [place] [address]    Troponin I (High Sensitivity) 11/02/2023 37 (H)  <18 ng/L Final   Comment: (NOTE) Elevated high sensitivity troponin I (hsTnI) values and  significant  changes across serial measurements may suggest ACS but many other  chronic and acute conditions are known to elevate hsTnI results.  Refer to the "Links" section for chest pain algorithms and additional  guidance. Performed at [facility], [address], [place], [place] [address]   Admission on 11/02/2023, Discharged on 11/02/2023  Component Date Value Ref Range Status   WBC 11/02/2023 9.2  4.0 - 10.5 K/uL Final   RBC 11/02/2023 3.77 (L)  3.87 - 5.11 MIL/uL Final   Hemoglobin 11/02/2023 12.9  12.0 - 15.0 g/dL Final   HCT 27/25/3664 36.7  36.0 - 46.0 % Final   MCV 11/02/2023 97.3  80.0 - 100.0 fL Final   MCH 11/02/2023 34.2 (H)  26.0 - 34.0 pg Final  MCHC 11/02/2023 35.1  30.0 - 36.0 g/dL Final   RDW 04/54/0981 12.7  11.5 - 15.5 % Final   Platelets 11/02/2023 390  150 - 400 K/uL Final   nRBC 11/02/2023 0.0  0.0 - 0.2 % Final   Neutrophils Relative % 11/02/2023 48  % Final   Neutro Abs 11/02/2023 4.5  1.7 - 7.7 K/uL Final   Lymphocytes Relative 11/02/2023 41  % Final   Lymphs Abs 11/02/2023 3.8  0.7 - 4.0 K/uL Final   Monocytes Relative 11/02/2023 6  % Final   Monocytes Absolute 11/02/2023 0.5  0.1 - 1.0 K/uL Final   Eosinophils Relative 11/02/2023 4  % Final   Eosinophils Absolute 11/02/2023 0.3  0.0 - 0.5 K/uL Final   Basophils Relative 11/02/2023 1  % Final   Basophils Absolute 11/02/2023 0.1  0.0 - 0.1 K/uL Final   Immature Granulocytes 11/02/2023 0  % Final   Abs Immature Granulocytes 11/02/2023 0.03  0.00 - 0.07 K/uL Final   Performed at [facility], [address] [place], [place] [address]   Sodium 11/02/2023 140  135 - 145 mmol/L Final   Potassium 11/02/2023 3.4 (L)  3.5 - 5.1 mmol/L Final   Chloride 11/02/2023 103  98 - 111 mmol/L Final   CO2 11/02/2023 23  22 - 32 mmol/L Final   Glucose, Bld 11/02/2023 81  70 - 99 mg/dL Final   Glucose reference range applies only to samples taken after fasting for at least 8 hours.   BUN 11/02/2023 6  6  - 20 mg/dL Final   Creatinine, Ser 11/02/2023 0.75  0.44 - 1.00 mg/dL Final   Calcium 82/95/6213 9.8  8.9 - 10.3 mg/dL Final   Total Protein 08/65/7846 7.7  6.5 - 8.1 g/dL Final   Albumin 96/29/5284 3.9  3.5 - 5.0 g/dL Final   AST 13/24/4010 47 (H)  15 - 41 U/L Final   ALT 11/02/2023 33  0 - 44 U/L Final   Alkaline Phosphatase 11/02/2023 53  38 - 126 U/L Final   Total Bilirubin 11/02/2023 0.9  0.0 - 1.2 mg/dL Final   GFR, Estimated 11/02/2023 >60  >60 mL/min Final   Comment: (NOTE) Calculated using the CKD-EPI Creatinine Equation (2021)    Anion gap 11/02/2023 14  5 - 15 Final   Performed at [facility], [address], [place], [place] [address]   Hgb A1c MFr Bld 11/02/2023 4.4 (L)  4.8 - 5.6 % Final   Comment: (NOTE) Pre diabetes:          5.7%-6.4%  Diabetes:              >6.4%  Glycemic control for   <7.0% adults with diabetes    Mean Plasma Glucose 11/02/2023 79.58  mg/dL Final   Performed at [facility], [address], [place], [place] [address]   Magnesium 11/02/2023 2.1  1.7 - 2.4 mg/dL Final   Performed at [facility], [address], [place], [place] [address]   Alcohol, Ethyl (B) 11/02/2023 <10  <10 mg/dL Final   Comment: (NOTE) Lowest detectable limit for serum alcohol is 10 mg/dL.  For medical purposes only. Performed at [facility], [address], [place], [place] [address]    POC Amphetamine UR 11/02/2023 None Detected  NONE DETECTED (Cut Off Level 1000 ng/mL) Final   POC Secobarbital (BAR) 11/02/2023 None Detected  NONE DETECTED (Cut Off Level 300 ng/mL) Final  POC Buprenorphine (BUP) 11/02/2023 None Detected  NONE DETECTED (Cut Off Level 10 ng/mL) Final   POC Oxazepam (BZO) 11/02/2023 None Detected  NONE DETECTED (Cut Off Level 300 ng/mL) Final   POC Cocaine UR 11/02/2023 Positive (A)  NONE DETECTED (Cut Off Level 300 ng/mL) Final   POC Methamphetamine UR 11/02/2023 Positive (A)  NONE DETECTED (Cut Off Level 1000 ng/mL) Final    POC Morphine 11/02/2023 None Detected  NONE DETECTED (Cut Off Level 300 ng/mL) Final   POC Methadone UR 11/02/2023 None Detected  NONE DETECTED (Cut Off Level 300 ng/mL) Final   POC Oxycodone UR 11/02/2023 None Detected  NONE DETECTED (Cut Off Level 100 ng/mL) Final   POC Marijuana UR 11/02/2023 Positive (A)  NONE DETECTED (Cut Off Level 50 ng/mL) Final    Allergies: Patient has no known allergies.  Medications:  Facility Ordered Medications  Medication   acetaminophen (TYLENOL) tablet 650 mg   alum & mag hydroxide-simeth (MAALOX/MYLANTA) 200-200-20 MG/5ML suspension 30 mL   magnesium hydroxide (MILK OF MAGNESIA) suspension 30 mL   haloperidol (HALDOL) tablet 5 mg   And   diphenhydrAMINE (BENADRYL) capsule 50 mg   haloperidol lactate (HALDOL) injection 5 mg   And   diphenhydrAMINE (BENADRYL) injection 50 mg   And   LORazepam (ATIVAN) injection 2 mg   haloperidol lactate (HALDOL) injection 10 mg   And   diphenhydrAMINE (BENADRYL) injection 50 mg   And   LORazepam (ATIVAN) injection 2 mg   traZODone (DESYREL) tablet 50 mg   thiamine (VITAMIN B1) injection 100 mg   [START ON 11/13/2023] thiamine (VITAMIN B1) tablet 100 mg   multivitamin with minerals tablet 1 tablet   LORazepam (ATIVAN) tablet 1 mg   hydrOXYzine (ATARAX) tablet 25 mg   loperamide (IMODIUM) capsule 2-4 mg   ondansetron (ZOFRAN-ODT) disintegrating tablet 4 mg   sertraline (ZOLOFT) tablet 50 mg   PTA Medications  Medication Sig   naproxen (NAPROSYN) 250 MG tablet Take 250 mg by mouth 2 (two) times daily as needed.   Multiple Vitamin (MULTIVITAMIN WITH MINERALS) TABS tablet Take 1 tablet by mouth daily.   thiamine (VITAMIN B-1) 100 MG tablet Take 1 tablet (100 mg total) by mouth daily.   naltrexone (DEPADE) 50 MG tablet Take 1 tablet (50 mg total) by mouth daily.   sertraline (ZOLOFT) 50 MG tablet Take 1 tablet (50 mg total) by mouth daily.   traZODone (DESYREL) 50 MG tablet Take 1 tablet (50 mg total) by mouth at  bedtime as needed for sleep.      Medical Decision Making  Admit to the continuous assessment unit  Medications: Restart Zoloft 50 mg daily Will hold naltrexone for now last alcohol consumer was this a.m. Start CIWA protocol with Ativan 1 mg p.o. every 6 hour for CIWA score greater than 10  Lab Orders         CBC with Differential/Platelet         Comprehensive metabolic panel         Ethanol         Urinalysis, Complete w Microscopic -Urine, Clean Catch         POC urine preg, ED         POCT Urine Drug Screen - (I-Screen)      EKG    Recommendations  Patient does not appear to have a acute medical condition or concern.  Patient will be admitted to the continuous assessment unit.  She will be reevaluated by psychiatry in  the a.m. and possibly could be reviewed for [facility]-patient recently discharged on [facility] 3/4/202  Ardis Hughs, NP 11/12/23  4:46 PM

## 2023-11-12 NOTE — Progress Notes (Addendum)
 Carla Rollins presents today as she was scheduled for a CCA to begin SAIOP.  Carla Rollins says she knows she told the [facility] downstairs that she wanted SAIOP but she says he needs residential.  Carla Rollins says that she drank 1/5 of liquor last night and her last drink was at 2 am this morning.  Name of Substance: Alcohol Age of first use: age 28 Amount using: 1 fifth of tequila a day Frequency of use: Daily  How long using: used less until a year ago and begin using 1/5 of tequilla per day.  13 years Last used: 2 am.  Finished a fifth of tequilla then Withdrawal symptoms: none yet Longest period of sobriety: A few days   Name of Substance: Cocaine Age of first use: 28 yrs old Amount using: $20 worth or 1/2 gram Frequency of use: Daily How long using: 4-5  years Last used: about 10pm last night. (11-11-23) Withdrawal symptoms: none yet Longest period of sobriety: A few days  ASAM's:  Six Dimensions of Multidimensional Assessment   Dimension 1:  Acute Intoxication and/or Withdrawal Potential:   Last drink was at 2am this morning (3)  Dimension 2:  Biomedical Conditions (0)  Dimension 3:  Emotional, Behavioral, or Cognitive Conditions and Complications:  1  Dimension 4:  Readiness to Change:  Dimension 4:  Description of Readiness to Change criteria: Reports she is willing to engage in treatment  Dimension 5:  Relapse, Continued use, or Continued Problem Potential:  4  Dimension 6:  Recovery/Living Environment: 4  ASAM Severity Score: ASAM's Severity Rating Score: 12  ASAM Recommended Level of Treatment: Level II residential    Carla Rollins says she has never had any counseling for her substance use, nor residential.   PHQ9 =18.  Onset was approximately one year ago.  She says the psychosocial stressor at that time was she had to move out of the house she and her son.  Carla Rollins says he is staying with his Dad while she "gets it together".  She is currently living with her sister. She reports this is not going  "too good".   GAD-9=15  Onset was approximately one year ago.  She reports the anxiety onset when the depressive sx did and for the same reasons.   DX:  Alcohol Use Disorder, Severe         Cocaine Use Disorder, Severe         Substance Induced Mood Disorder   Chart notes indicate Carla Rollins was accepted to [facility] when she was in [facility] but changed her mind.  Therapist calls [facility] back after Carla Rollins reports she drank a fifth of tequila last night.  Carla Rollins said Carla Rollins would need to go to detox first and detox can contact them afterwards.  Carla Eisenmenger, LMFT, LCAS 11-12-23

## 2023-11-13 ENCOUNTER — Other Ambulatory Visit (HOSPITAL_COMMUNITY): Admission: EM | Admit: 2023-11-13 | Discharge: 2023-11-16 | Attending: Psychiatry | Admitting: Psychiatry

## 2023-11-13 ENCOUNTER — Other Ambulatory Visit: Payer: Self-pay

## 2023-11-13 DIAGNOSIS — F329 Major depressive disorder, single episode, unspecified: Secondary | ICD-10-CM | POA: Diagnosis not present

## 2023-11-13 DIAGNOSIS — F4310 Post-traumatic stress disorder, unspecified: Secondary | ICD-10-CM | POA: Insufficient documentation

## 2023-11-13 DIAGNOSIS — F1020 Alcohol dependence, uncomplicated: Secondary | ICD-10-CM | POA: Diagnosis not present

## 2023-11-13 DIAGNOSIS — F1994 Other psychoactive substance use, unspecified with psychoactive substance-induced mood disorder: Secondary | ICD-10-CM | POA: Diagnosis present

## 2023-11-13 MED ORDER — SERTRALINE HCL 50 MG PO TABS
50.0000 mg | ORAL_TABLET | Freq: Every day | ORAL | Status: DC
  Administered 2023-11-14 – 2023-11-16 (×3): 50 mg via ORAL
  Filled 2023-11-13 (×3): qty 1
  Filled 2023-11-13: qty 14

## 2023-11-13 MED ORDER — HALOPERIDOL LACTATE 5 MG/ML IJ SOLN: 5.0000 mg | Freq: Three times a day (TID) | INTRAMUSCULAR | Status: DC | PRN

## 2023-11-13 MED ORDER — MAGNESIUM HYDROXIDE 400 MG/5ML PO SUSP: 30.0000 mL | Freq: Every day | ORAL | Status: DC | PRN

## 2023-11-13 MED ORDER — ONDANSETRON 4 MG PO TBDP: 4.0000 mg | ORAL_TABLET | Freq: Four times a day (QID) | ORAL | Status: AC | PRN

## 2023-11-13 MED ORDER — THIAMINE MONONITRATE 100 MG PO TABS
100.0000 mg | ORAL_TABLET | Freq: Every day | ORAL | Status: DC
  Administered 2023-11-14 – 2023-11-16 (×3): 100 mg via ORAL
  Filled 2023-11-13: qty 1
  Filled 2023-11-13: qty 14
  Filled 2023-11-13 (×2): qty 1

## 2023-11-13 MED ORDER — SERTRALINE HCL 50 MG PO TABS: 50.0000 mg | ORAL_TABLET | Freq: Every day | ORAL | Status: DC

## 2023-11-13 MED ORDER — ADULT MULTIVITAMIN W/MINERALS CH
1.0000 | ORAL_TABLET | Freq: Every day | ORAL | Status: DC
  Administered 2023-11-14 – 2023-11-16 (×3): 1 via ORAL
  Filled 2023-11-13: qty 1
  Filled 2023-11-13: qty 14
  Filled 2023-11-13 (×2): qty 1

## 2023-11-13 MED ORDER — ALUM & MAG HYDROXIDE-SIMETH 200-200-20 MG/5ML PO SUSP: 30.0000 mL | ORAL | Status: DC | PRN

## 2023-11-13 MED ORDER — HYDROXYZINE HCL 25 MG PO TABS
25.0000 mg | ORAL_TABLET | Freq: Four times a day (QID) | ORAL | Status: AC | PRN
  Administered 2023-11-14 – 2023-11-15 (×2): 25 mg via ORAL
  Filled 2023-11-13 (×2): qty 1
  Filled 2023-11-13: qty 15

## 2023-11-13 MED ORDER — DIPHENHYDRAMINE HCL 50 MG PO CAPS: 50.0000 mg | ORAL_CAPSULE | Freq: Three times a day (TID) | ORAL | Status: DC | PRN

## 2023-11-13 MED ORDER — LOPERAMIDE HCL 2 MG PO CAPS: 2.0000 mg | ORAL_CAPSULE | ORAL | Status: AC | PRN

## 2023-11-13 MED ORDER — TRAZODONE HCL 50 MG PO TABS
50.0000 mg | ORAL_TABLET | Freq: Every evening | ORAL | Status: DC | PRN
  Administered 2023-11-14: 50 mg via ORAL
  Filled 2023-11-13: qty 14
  Filled 2023-11-13 (×2): qty 1

## 2023-11-13 MED ORDER — DIPHENHYDRAMINE HCL 50 MG/ML IJ SOLN: 50.0000 mg | Freq: Three times a day (TID) | INTRAMUSCULAR | Status: DC | PRN

## 2023-11-13 MED ORDER — LORAZEPAM 2 MG/ML IJ SOLN: 2.0000 mg | Freq: Three times a day (TID) | INTRAMUSCULAR | Status: DC | PRN

## 2023-11-13 MED ORDER — HALOPERIDOL 5 MG PO TABS: 5.0000 mg | ORAL_TABLET | Freq: Three times a day (TID) | ORAL | Status: DC | PRN

## 2023-11-13 MED ORDER — ACETAMINOPHEN 325 MG PO TABS
650.0000 mg | ORAL_TABLET | Freq: Four times a day (QID) | ORAL | Status: DC | PRN
  Administered 2023-11-14 – 2023-11-16 (×3): 650 mg via ORAL
  Filled 2023-11-13 (×2): qty 2
  Filled 2023-11-13: qty 1
  Filled 2023-11-13: qty 2

## 2023-11-13 MED ORDER — LORAZEPAM 1 MG PO TABS
1.0000 mg | ORAL_TABLET | Freq: Four times a day (QID) | ORAL | Status: AC | PRN
  Filled 2023-11-13: qty 1

## 2023-11-13 MED ORDER — HALOPERIDOL LACTATE 5 MG/ML IJ SOLN: 10.0000 mg | Freq: Three times a day (TID) | INTRAMUSCULAR | Status: DC | PRN

## 2023-11-13 NOTE — ED Notes (Signed)
 Pt is currently asleep, resp are even and unlabored. There are no apparent s/sx of distress. No further concerns at this time.

## 2023-11-13 NOTE — Group Note (Signed)
 Group Topic: Balance in Life  Group Date: 11/13/2023 Start Time: 0300 End Time: 0350 Facilitators: Loleta Dicker, LCSW  Department: [facility]  Number of Participants: 6  Group Focus: activities of daily living skills, check in, clarity of thought, coping skills, daily focus, feeling awareness/expression, healthy friendships, personal responsibility, problem solving, relapse prevention, self-awareness, and self-esteem Treatment Modality:  Behavior Modification Therapy and Cognitive Behavioral Therapy Interventions utilized were assignment, confrontation, exploration, group exercise, mental fitness, story telling, and support Purpose: enhance coping skills, explore maladaptive thinking, express feelings, express irrational fears, improve communication skills, increase insight, regain self-worth, reinforce self-care, relapse prevention strategies, and trigger / craving management  Name: Carla Rollins Date of Birth: May 19, 1996  MR: [id number]    Level of Participation: active Quality of Participation: attentive and cooperative Interactions with others: gave feedback Mood/Affect: appropriate Triggers (if applicable): N/A Cognition: coherent/clear Progress: Gaining insight Summary of Progress/Problems: Patient actively participated in group on today. Group started off with introductions and group rules. Group members participated in a therapeutic activity that required active listening and communication skills. Group members were able to identify similarities and differences within the group. Patient interacted positively with staff and peers. No issues to report.  Plan: referral / recommendations  Patients Problems:  Patient Active Problem List   Diagnosis Date Noted   Substance induced mood disorder (HCC) 11/13/2023   Alcohol use disorder 11/02/2023   Vaginal delivery 05/07/2014   Post-dates pregnancy 05/04/2014   Marijuana abuse 10/17/2013   Supervision of  normal pregnancy in first trimester 10/09/2013   Overweight 07/27/2009   Hearing loss 07/27/2009   ECZEMA 07/27/2009

## 2023-11-13 NOTE — Group Note (Signed)
 Group Topic: Wellness  Group Date: 11/13/2023 Start Time: 1330 End Time: 1400 Facilitators: Jerald Kief, RN  Department: [facility]  Number of Participants: 8  Group Focus: coping skills Treatment Modality:  Psychoeducation Interventions utilized were group exercise Purpose: enhance coping skills  Name: Raeann Offner Date of Birth: 03-Dec-1995  MR: [id number]    Level of Participation: active Quality of Participation: cooperative and engaged Interactions with others: gave feedback Mood/Affect: positive Triggers (if applicable): n/a Cognition: goal directed Progress: Moderate Response: pt states that her coping skills are eating new foods, watching dance tv shows, and focusing on her kid.  Plan: patient will be encouraged to continue treatment until a disposition rec is decided upon.   Patients Problems:  Patient Active Problem List   Diagnosis Date Noted   Substance induced mood disorder (HCC) 11/13/2023   Alcohol use disorder 11/02/2023   Vaginal delivery 05/07/2014   Post-dates pregnancy 05/04/2014   Marijuana abuse 10/17/2013   Supervision of normal pregnancy in first trimester 10/09/2013   Overweight 07/27/2009   Hearing loss 07/27/2009   ECZEMA 07/27/2009

## 2023-11-13 NOTE — ED Notes (Signed)
 Patient was provided dinner

## 2023-11-13 NOTE — ED Notes (Signed)
Pt sleeping at this time   Rise and fall of chest noted

## 2023-11-13 NOTE — ED Notes (Signed)
 Pt sleeping@this  time breathing even and unlabored will continue to monitor for safety

## 2023-11-13 NOTE — ED Notes (Signed)
Patient being transferred to [facility]

## 2023-11-13 NOTE — Group Note (Signed)
 Group Topic: Social Support  Group Date: 11/13/2023 Start Time: 2100 End Time: 2130 Facilitators: Rae Lips B  Department: [place]  Number of Participants: 8  Group Focus: acceptance, check in, communication, daily focus, and individual meeting Treatment Modality:  Leisure Development Interventions utilized were leisure development and support Purpose: express feelings  Name: Carla Rollins Date of Birth: Jan 13, 1996  MR: [id number]    Level of Participation: PT DID NOT ATTEND GROUP Quality of Participation: cooperative Interactions with others: gave feedback Mood/Affect: appropriate Triggers (if applicable): NA Cognition: coherent/clear Progress: None Response: NA Plan: patient will be encouraged to go to groups.   Patients Problems:  Patient Active Problem List   Diagnosis Date Noted   Substance induced mood disorder (HCC) 11/13/2023   Alcohol use disorder 11/02/2023   Vaginal delivery 05/07/2014   Post-dates pregnancy 05/04/2014   Marijuana abuse 10/17/2013   Supervision of normal pregnancy in first trimester 10/09/2013   Overweight 07/27/2009   Hearing loss 07/27/2009   ECZEMA 07/27/2009

## 2023-11-13 NOTE — ED Notes (Signed)
 Patient was provided lunch

## 2023-11-13 NOTE — Group Note (Signed)
 Group Topic: Relaxation  Group Date: 11/13/2023 Start Time: 1730 End Time: 1800 Facilitators: Cassandria Anger  Department: [facility]  Number of Participants: 7  Group Focus: other meditation Treatment Modality:  Psychoeducation Interventions utilized were patient education Purpose: increase insight  Name: Carla Rollins Date of Birth: September 29, 1995  MR: [id number]    Level of Participation: moderate Quality of Participation: cooperative and engaged Interactions with others: gave feedback Mood/Affect: appropriate and positive Triggers (if applicable): N/A Cognition: coherent/clear and logical Progress: Gaining insight Response: Patient was asked to meditate and clear all negative thoughts and energy Plan: patient will be encouraged to continue to attend group  Patients Problems:  Patient Active Problem List   Diagnosis Date Noted   Substance induced mood disorder (HCC) 11/13/2023   Alcohol use disorder 11/02/2023   Vaginal delivery 05/07/2014   Post-dates pregnancy 05/04/2014   Marijuana abuse 10/17/2013   Supervision of normal pregnancy in first trimester 10/09/2013   Overweight 07/27/2009   Hearing loss 07/27/2009   ECZEMA 07/27/2009

## 2023-11-13 NOTE — Care Management (Signed)
 OBS Care Management   Writer referred patient to [facility].

## 2023-11-13 NOTE — ED Notes (Signed)
 Pt is in his room resting in bed. Pt reports sweating and shakiness.  Pt denies SI/HI/AVH. No acute distress noted. Will continue to monitor for safety and provide support.

## 2023-11-13 NOTE — ED Notes (Signed)
 Pt brought over from [facility]. Vitals gotten & stable. RN went through admission packet with pt, signatures completed & all questions answered. RN offered food, but pt states she just ate over in [facility]. She denies having any concerns at this time, stating they just gave her medicine over at [facility] and she's feeling better, just a little sleepy. No s/sx of distress, pt oriented to unit. No further concerns.

## 2023-11-13 NOTE — ED Provider Notes (Signed)
 FBC/OBS ASAP Discharge Summary  Date and Time: 11/13/2023 9:59 AM  Name: Carla Rollins  MRN:  [id number]   Discharge Diagnoses:  Final diagnoses:  Substance induced mood disorder (HCC)  Alcohol use disorder  HPI: patient initally presented to [facility] as a walk in on 11/12/2023  unaccompanied requesting alcohol detox.  This was at the recommendation of  Marlou Porch at [facility] on second floor  Carla Rollins, 28 y.o., female patient seen face to face by this provider, consulted with Dr. Enedina Finner; and chart reviewed on 11/13/23.    Per chart review patient was admitted to the [facility] 11/03/2023-11/06/2023 and discharged with instructions to follow-up with the [facility] program.  She was prescribed sertraline 50 mg daily, naltrexone 50 mg daily, and trazodone 50 mg nightly.  She is unemployed and lives with her sister.  She has a 29-year-old son who lives with his father.   Subjective:   Currently on assessment patient is laying in her bed asleep. She is easily awakened. She reports feeling sleepy and ask if it was due to the zoloft. She is denying any alcohol withdrawal sypmtoms at this time. She continues to be depressed but less anxious.She denies any concerns with appetite.  She denies SI/HI/VH.  She has been compliant with medications on the unit. She has required not as needed medications for agitation.   Stay Summary:   Patient meets criteria for treatment in the [facility] She is seeking long term residential treatment.   Total Time spent with patient: 20 minutes  Past Psychiatric History: see H&P Past Medical History:  see H&P Family History:  see H&P Family Psychiatric History:  see H&P Social History:  see H&P Tobacco Cessation:  Prescription not provided because: she is being transferred to the [facility]  Current Medications:  Current Facility-Administered Medications  Medication Dose Route Frequency Provider Last Rate Last Admin   acetaminophen (TYLENOL) tablet 650 mg  650 mg Oral Q6H PRN  Ardis Hughs, NP       alum & mag hydroxide-simeth (MAALOX/MYLANTA) 200-200-20 MG/5ML suspension 30 mL  30 mL Oral Q4H PRN Ardis Hughs, NP       haloperidol (HALDOL) tablet 5 mg  5 mg Oral TID PRN Ardis Hughs, NP       And   diphenhydrAMINE (BENADRYL) capsule 50 mg  50 mg Oral TID PRN Ardis Hughs, NP       haloperidol lactate (HALDOL) injection 5 mg  5 mg Intramuscular TID PRN Ardis Hughs, NP       And   diphenhydrAMINE (BENADRYL) injection 50 mg  50 mg Intramuscular TID PRN Ardis Hughs, NP       And   LORazepam (ATIVAN) injection 2 mg  2 mg Intramuscular TID PRN Ardis Hughs, NP       haloperidol lactate (HALDOL) injection 10 mg  10 mg Intramuscular TID PRN Ardis Hughs, NP       And   diphenhydrAMINE (BENADRYL) injection 50 mg  50 mg Intramuscular TID PRN Ardis Hughs, NP       And   LORazepam (ATIVAN) injection 2 mg  2 mg Intramuscular TID PRN Ardis Hughs, NP       hydrOXYzine (ATARAX) tablet 25 mg  25 mg Oral Q6H PRN Ardis Hughs, NP       loperamide (IMODIUM) capsule 2-4 mg  2-4 mg Oral PRN Ardis Hughs, NP  LORazepam (ATIVAN) tablet 1 mg  1 mg Oral Q6H PRN Ardis Hughs, NP       magnesium hydroxide (MILK OF MAGNESIA) suspension 30 mL  30 mL Oral Daily PRN Ardis Hughs, NP       multivitamin with minerals tablet 1 tablet  1 tablet Oral Daily Ardis Hughs, NP   1 tablet at 11/13/23 0916   ondansetron (ZOFRAN-ODT) disintegrating tablet 4 mg  4 mg Oral Q6H PRN Ardis Hughs, NP       sertraline (ZOLOFT) tablet 50 mg  50 mg Oral Daily Ardis Hughs, NP   50 mg at 11/13/23 0981   thiamine (VITAMIN B1) tablet 100 mg  100 mg Oral Daily Ardis Hughs, NP   100 mg at 11/13/23 0916   traZODone (DESYREL) tablet 50 mg  50 mg Oral QHS PRN Ardis Hughs, NP       No current outpatient medications on file.    PTA Medications:  Facility Ordered Medications  Medication    acetaminophen (TYLENOL) tablet 650 mg   alum & mag hydroxide-simeth (MAALOX/MYLANTA) 200-200-20 MG/5ML suspension 30 mL   magnesium hydroxide (MILK OF MAGNESIA) suspension 30 mL   haloperidol (HALDOL) tablet 5 mg   And   diphenhydrAMINE (BENADRYL) capsule 50 mg   haloperidol lactate (HALDOL) injection 5 mg   And   diphenhydrAMINE (BENADRYL) injection 50 mg   And   LORazepam (ATIVAN) injection 2 mg   haloperidol lactate (HALDOL) injection 10 mg   And   diphenhydrAMINE (BENADRYL) injection 50 mg   And   LORazepam (ATIVAN) injection 2 mg   traZODone (DESYREL) tablet 50 mg   [COMPLETED] thiamine (VITAMIN B1) injection 100 mg   thiamine (VITAMIN B1) tablet 100 mg   multivitamin with minerals tablet 1 tablet   LORazepam (ATIVAN) tablet 1 mg   hydrOXYzine (ATARAX) tablet 25 mg   loperamide (IMODIUM) capsule 2-4 mg   ondansetron (ZOFRAN-ODT) disintegrating tablet 4 mg   sertraline (ZOLOFT) tablet 50 mg       11/12/2023   11:09 AM 11/06/2023   10:32 AM  Depression screen PHQ 2/9  Decreased Interest 2 0  Down, Depressed, Hopeless 3 0  PHQ - 2 Score 5 0  Altered sleeping 3   Tired, decreased energy 3   Change in appetite 3   Feeling bad or failure about yourself  3   Trouble concentrating 1   Moving slowly or fidgety/restless 0   Suicidal thoughts 0   PHQ-9 Score 18     Flowsheet Row ED from 11/12/2023 in [facility] ED from 11/03/2023 in [facility] ED from 11/02/2023 in [facility] Emergency Department at [facility]  C-SSRS RISK CATEGORY No Risk No Risk No Risk       Musculoskeletal  Strength & Muscle Tone: within normal limits Gait & Station: normal Patient leans: N/A  Psychiatric Specialty Exam  Presentation  General Appearance:  Appropriate for Environment; Casual  Eye Contact: Good  Speech: Clear and Coherent; Normal Rate  Speech Volume: Normal  Handedness: Right   Mood and Affect   Mood: Anxious; Depressed  Affect: Congruent   Thought Process  Thought Processes: Coherent  Descriptions of Associations:Intact  Orientation:Full (Time, Place and Person)  Thought Content:Logical  Diagnosis of Schizophrenia or Schizoaffective disorder in past: No    Hallucinations:Hallucinations: None  Ideas of Reference:None  Suicidal Thoughts:Suicidal Thoughts: No  Homicidal Thoughts:Homicidal Thoughts: No   Sensorium  Memory: Immediate Good; Recent Good; Remote Good  Judgment: Fair  Insight: Fair   Chartered certified accountant: Fair  Attention Span: Fair  Recall: [employer] of Knowledge: Fair  Language: Fair   Psychomotor Activity  Psychomotor Activity: Psychomotor Activity: Normal   Assets  Assets: Manufacturing systems engineer; Desire for Improvement; Leisure Time; Resilience; Physical Health   Sleep  Sleep: Sleep: Good   Nutritional Assessment (For OBS and FBC admissions only) Has the patient had a weight loss or gain of 10 pounds or more in the last 3 months?: No Has the patient had a decrease in food intake/or appetite?: No Does the patient have dental problems?: No Does the patient have eating habits or behaviors that may be indicators of an eating disorder including binging or inducing vomiting?: No Has the patient recently lost weight without trying?: 0 Has the patient been eating poorly because of a decreased appetite?: 0 Malnutrition Screening Tool Score: 0    Physical Exam  Physical Exam Constitutional:      Appearance: Normal appearance.  Eyes:     General:        Right eye: No discharge.        Left eye: No discharge.  Cardiovascular:     Rate and Rhythm: Normal rate.  Pulmonary:     Effort: Pulmonary effort is normal. No respiratory distress.  Musculoskeletal:        General: Normal range of motion.     Cervical back: Normal range of motion.  Neurological:     Mental Status: She is alert and oriented to  person, place, and time.  Psychiatric:        Attention and Perception: Attention and perception normal.        Mood and Affect: Mood is anxious and depressed.        Speech: Speech normal.        Behavior: Behavior normal. Behavior is cooperative.        Thought Content: Thought content normal.        Cognition and Memory: Cognition normal.        Judgment: Judgment normal.    Review of Systems  Constitutional:  Negative for chills and fever.  HENT:  Negative for hearing loss.   Respiratory:  Negative for cough and shortness of breath.   Cardiovascular:  Negative for chest pain.  Neurological:  Negative for dizziness, tremors and seizures.  Psychiatric/Behavioral:  Positive for depression and substance abuse. The patient is nervous/anxious.    Blood pressure 110/71, pulse 77, temperature 97.9 F (36.6 C), temperature source Oral, resp. rate 16, last menstrual period 10/19/2023, SpO2 98%, unknown if currently breastfeeding. There is no height or weight on file to calculate BMI.   Disposition:   Patient meets criteria for treatment in the [facility]. She is seeking long term residential treatment.  Medications: Continue Zoloft 50 mg daily Will hold naltrexone for now last alcohol consumer was 11/12/2023 Continue CIWA protocol with Ativan 1 mg p.o. every 6 hour for CIWA score greater than 10  Ardis Hughs, NP 11/13/2023, 9:59 AM

## 2023-11-13 NOTE — Discharge Instructions (Addendum)
 Readmit to [facility]

## 2023-11-14 DIAGNOSIS — F4310 Post-traumatic stress disorder, unspecified: Secondary | ICD-10-CM | POA: Diagnosis not present

## 2023-11-14 DIAGNOSIS — F329 Major depressive disorder, single episode, unspecified: Secondary | ICD-10-CM | POA: Diagnosis not present

## 2023-11-14 DIAGNOSIS — F1020 Alcohol dependence, uncomplicated: Secondary | ICD-10-CM | POA: Diagnosis not present

## 2023-11-14 MED ORDER — NICOTINE 21 MG/24HR TD PT24
21.0000 mg | MEDICATED_PATCH | Freq: Every day | TRANSDERMAL | Status: DC
  Administered 2023-11-14 – 2023-11-16 (×4): 21 mg via TRANSDERMAL
  Filled 2023-11-14: qty 14
  Filled 2023-11-14 (×3): qty 1

## 2023-11-14 NOTE — ED Notes (Signed)
 Pt awake, in the group room, eating some breakfast. Pt remains calm, cooperative and pleasant at this time. No s/sx of distress, no concerns.

## 2023-11-14 NOTE — ED Notes (Signed)
 Pt a/o, pleasant and engaged.Denies c/o SI/ HI/AVH. Voices c/o mild anxiety and slightly clammy at times. She states that she "has a lot going on outside of here that is stressing her" . No noted distress. Will continue to monitor for safety

## 2023-11-14 NOTE — ED Notes (Signed)
 Patient is sleeping. Respirations equal and unlabored, skin warm and dry. No change in assessment or acuity. Routine safety checks conducted according to facility protocol. Will continue to monitor for safety.

## 2023-11-14 NOTE — Discharge Planning (Signed)
 LCSW followed up with patient on this morning to discuss disposition plans. Patient reports her goal is to seek residential placement at this time. Patient reports she would like to be reconsidered for [facility]. Patient aware that referral was sent over on yesterday for review and has been received by Admissions Coordinator Marcelino Duster. Once update has been provided, LCSW will follow up to inform. Patient expressed understanding and appreciation of LCSW assistance. No other needs to report at this time. LCSW will continue to follow and provide support to patient while on FBC unit.   Fernande Boyden, LCSW Clinical Social Worker [facility] Ph: [phone]

## 2023-11-14 NOTE — ED Notes (Signed)
 Pt interacting with other pts in the group room. Dinner was given earlier. No s/sx of distress. No concerns at this time.

## 2023-11-14 NOTE — Group Note (Signed)
 Group Topic: Wellness  Group Date: 11/14/2023 Start Time: 1100 End Time: 1130 Facilitators: Vicki Mallet, NT  Department: [facility]  Number of Participants: 7  Group Focus: check in Treatment Modality:  Psychoeducation Interventions utilized were patient education Purpose: reinforce self-care  Name: Carla Rollins Date of Birth: September 06, 1995  MR: [id number]    Level of Participation: active Quality of Participation: cooperative Interactions with others: gave feedback Mood/Affect: appropriate Triggers (if applicable): NA Cognition: coherent/clear Progress: Moderate Response: Shared that she is looking forward to getting better and she is planning to go to inpatient. Plan: follow-up needed  Patients Problems:  Patient Active Problem List   Diagnosis Date Noted   Substance induced mood disorder (HCC) 11/13/2023   Alcohol use disorder 11/02/2023   Vaginal delivery 05/07/2014   Post-dates pregnancy 05/04/2014   Marijuana abuse 10/17/2013   Supervision of normal pregnancy in first trimester 10/09/2013   Overweight 07/27/2009   Hearing loss 07/27/2009   ECZEMA 07/27/2009

## 2023-11-14 NOTE — ED Provider Notes (Signed)
 Behavioral Health Progress Note  Date and Time: 11/14/2023 1:17 PM Name: Carla Rollins MRN:  [id number]  Subjective:  Carla Rollins, 28 y.o., AA female patient presents to [facility] as a walk in  unaccompanied requesting alcohol detox.  Per chart review patient was admitted to the [facility] 11/03/2023-11/06/2023 and discharged with instructions to follow-up with the [facility] IOP program.  She was prescribed sertraline 50 mg daily, naltrexone 50 mg daily, and trazodone 50 mg nightly.  She is unemployed and lives with her sister.  She has a 75-year-old son who lives with his father.    Today's Assessment Notes: During evaluation Carla Rollins is observed sitting on her bed. She is alert, cooperative, attentive oriented to person, place, time & situation. She is able to answer assessment questions appropriately. Speech is clear, coherent with normal volume and pattern.  She is casually dressed and makes fair eye contact. Reports anxiety and depression of # 10/10, with 10 being high severity. Encouraged to obtain PRN from nursing staff. She reports withdrawal symptoms of sweating, HA, and shaking and continues on Alcohol detox protocol. Reports stressors to include substance use, financial problems, homelessness and not having her son. She denies any suicidal or homicidal ideations.  She denies auditory/visual hallucinations. She does not appear manic, psychotic, delusional, or paranoid.  She does not appear to be responding to internal/external stimuli.    Diagnosis: Substance use disorder Substance use disorder Alcohol use disorder Cocaine use disorder  Total Time spent with patient: 45 minutes  Past Psychiatric History: See H&P Past Medical History: See H&P Family History: See H&P Family Psychiatric  History: See H&P Social History:  See H&P Additional Social History:See H&P   Sleep: Good  Appetite:  Good  Current Medications:  Current Facility-Administered Medications  Medication Dose Route Frequency  Provider Last Rate Last Admin   acetaminophen (TYLENOL) tablet 650 mg  650 mg Oral Q6H PRN Ardis Hughs, NP   650 mg at 11/14/23 0912   alum & mag hydroxide-simeth (MAALOX/MYLANTA) 200-200-20 MG/5ML suspension 30 mL  30 mL Oral Q4H PRN Ardis Hughs, NP       haloperidol (HALDOL) tablet 5 mg  5 mg Oral TID PRN Ardis Hughs, NP       And   diphenhydrAMINE (BENADRYL) capsule 50 mg  50 mg Oral TID PRN Ardis Hughs, NP       haloperidol lactate (HALDOL) injection 5 mg  5 mg Intramuscular TID PRN Ardis Hughs, NP       And   diphenhydrAMINE (BENADRYL) injection 50 mg  50 mg Intramuscular TID PRN Ardis Hughs, NP       And   LORazepam (ATIVAN) injection 2 mg  2 mg Intramuscular TID PRN Ardis Hughs, NP       haloperidol lactate (HALDOL) injection 10 mg  10 mg Intramuscular TID PRN Ardis Hughs, NP       And   diphenhydrAMINE (BENADRYL) injection 50 mg  50 mg Intramuscular TID PRN Ardis Hughs, NP       And   LORazepam (ATIVAN) injection 2 mg  2 mg Intramuscular TID PRN Ardis Hughs, NP       hydrOXYzine (ATARAX) tablet 25 mg  25 mg Oral Q6H PRN Ardis Hughs, NP       loperamide (IMODIUM) capsule 2-4 mg  2-4 mg Oral PRN Ardis Hughs, NP       LORazepam (ATIVAN) tablet 1 mg  1 mg  Oral Q6H PRN Ardis Hughs, NP       magnesium hydroxide (MILK OF MAGNESIA) suspension 30 mL  30 mL Oral Daily PRN Ardis Hughs, NP       multivitamin with minerals tablet 1 tablet  1 tablet Oral Daily Ardis Hughs, NP   1 tablet at 11/14/23 0910   nicotine (NICODERM CQ - dosed in mg/24 hours) patch 21 mg  21 mg Transdermal Daily Carrion-Carrero, Margely, MD   21 mg at 11/14/23 1148   ondansetron (ZOFRAN-ODT) disintegrating tablet 4 mg  4 mg Oral Q6H PRN Ardis Hughs, NP       sertraline (ZOLOFT) tablet 50 mg  50 mg Oral Daily Ardis Hughs, NP   50 mg at 11/14/23 0910   thiamine (VITAMIN B1) tablet 100 mg  100 mg Oral Daily  Ardis Hughs, NP   100 mg at 11/14/23 0911   traZODone (DESYREL) tablet 50 mg  50 mg Oral QHS PRN Ardis Hughs, NP       Current Outpatient Medications  Medication Sig Dispense Refill   sertraline (ZOLOFT) 50 MG tablet Take 1 tablet (50 mg total) by mouth daily.      Labs  Lab Results:  Admission on 11/12/2023, Discharged on 11/13/2023  Component Date Value Ref Range Status   WBC 11/12/2023 8.9  4.0 - 10.5 K/uL Final   RBC 11/12/2023 4.28  3.87 - 5.11 MIL/uL Final   Hemoglobin 11/12/2023 14.4  12.0 - 15.0 g/dL Final   HCT 47/82/9562 41.6  36.0 - 46.0 % Final   MCV 11/12/2023 97.2  80.0 - 100.0 fL Final   MCH 11/12/2023 33.6  26.0 - 34.0 pg Final   MCHC 11/12/2023 34.6  30.0 - 36.0 g/dL Final   RDW 13/04/6577 12.1  11.5 - 15.5 % Final   Platelets 11/12/2023 364  150 - 400 K/uL Final   nRBC 11/12/2023 0.0  0.0 - 0.2 % Final   Neutrophils Relative % 11/12/2023 44  % Final   Neutro Abs 11/12/2023 3.9  1.7 - 7.7 K/uL Final   Lymphocytes Relative 11/12/2023 45  % Final   Lymphs Abs 11/12/2023 3.9  0.7 - 4.0 K/uL Final   Monocytes Relative 11/12/2023 5  % Final   Monocytes Absolute 11/12/2023 0.5  0.1 - 1.0 K/uL Final   Eosinophils Relative 11/12/2023 5  % Final   Eosinophils Absolute 11/12/2023 0.5  0.0 - 0.5 K/uL Final   Basophils Relative 11/12/2023 1  % Final   Basophils Absolute 11/12/2023 0.1  0.0 - 0.1 K/uL Final   Immature Granulocytes 11/12/2023 0  % Final   Abs Immature Granulocytes 11/12/2023 0.02  0.00 - 0.07 K/uL Final   Performed at [facility], [address], [place], [place] [address]   Sodium 11/12/2023 139  135 - 145 mmol/L Final   Potassium 11/12/2023 4.3  3.5 - 5.1 mmol/L Final   Chloride 11/12/2023 105  98 - 111 mmol/L Final   CO2 11/12/2023 25  22 - 32 mmol/L Final   Glucose, Bld 11/12/2023 80  70 - 99 mg/dL Final   Glucose reference range applies only to samples taken after fasting for at least 8 hours.   BUN 11/12/2023 7  6 - 20 mg/dL  Final   Creatinine, Ser 11/12/2023 0.76  0.44 - 1.00 mg/dL Final   Calcium 95/28/4132 9.7  8.9 - 10.3 mg/dL Final   Total Protein 44/09/270 7.5  6.5 - 8.1 g/dL Final  Albumin 11/12/2023 4.1  3.5 - 5.0 g/dL Final   AST 16/06/9603 41  15 - 41 U/L Final   ALT 11/12/2023 27  0 - 44 U/L Final   Alkaline Phosphatase 11/12/2023 54  38 - 126 U/L Final   Total Bilirubin 11/12/2023 0.6  0.0 - 1.2 mg/dL Final   GFR, Estimated 11/12/2023 >60  >60 mL/min Final   Comment: (NOTE) Calculated using the CKD-EPI Creatinine Equation (2021)    Anion gap 11/12/2023 9  5 - 15 Final   Performed at [facility], [address] [place], [place] [address]   Alcohol, Ethyl (B) 11/12/2023 <10  <10 mg/dL Final   Comment: (NOTE) Lowest detectable limit for serum alcohol is 10 mg/dL.  For medical purposes only. Performed at [facility], [address], [place], [place] [address]    Preg Test, Ur 11/12/2023 Negative  Negative Final   POC Amphetamine UR 11/12/2023 None Detected  NONE DETECTED (Cut Off Level 1000 ng/mL) Final   POC Secobarbital (BAR) 11/12/2023 None Detected  NONE DETECTED (Cut Off Level 300 ng/mL) Final   POC Buprenorphine (BUP) 11/12/2023 None Detected  NONE DETECTED (Cut Off Level 10 ng/mL) Final   POC Oxazepam (BZO) 11/12/2023 None Detected  NONE DETECTED (Cut Off Level 300 ng/mL) Final   POC Cocaine UR 11/12/2023 Positive (A)  NONE DETECTED (Cut Off Level 300 ng/mL) Final   POC Methamphetamine UR 11/12/2023 None Detected  NONE DETECTED (Cut Off Level 1000 ng/mL) Final   POC Morphine 11/12/2023 None Detected  NONE DETECTED (Cut Off Level 300 ng/mL) Final   POC Methadone UR 11/12/2023 None Detected  NONE DETECTED (Cut Off Level 300 ng/mL) Final   POC Oxycodone UR 11/12/2023 None Detected  NONE DETECTED (Cut Off Level 100 ng/mL) Final   POC Marijuana UR 11/12/2023 Positive (A)  NONE DETECTED (Cut Off Level 50 ng/mL) Final   Color, Urine 11/12/2023 AMBER (A)  YELLOW Final    BIOCHEMICALS MAY BE AFFECTED BY COLOR   APPearance 11/12/2023 HAZY (A)  CLEAR Final   Specific Gravity, Urine 11/12/2023 1.027  1.005 - 1.030 Final   pH 11/12/2023 5.0  5.0 - 8.0 Final   Glucose, UA 11/12/2023 NEGATIVE  NEGATIVE mg/dL Final   Hgb urine dipstick 11/12/2023 LARGE (A)  NEGATIVE Final   Bilirubin Urine 11/12/2023 NEGATIVE  NEGATIVE Final   Ketones, ur 11/12/2023 NEGATIVE  NEGATIVE mg/dL Final   Protein, ur 78/29/5621 NEGATIVE  NEGATIVE mg/dL Final   Nitrite 30/86/5784 POSITIVE (A)  NEGATIVE Final   Leukocytes,Ua 11/12/2023 NEGATIVE  NEGATIVE Final   RBC / HPF 11/12/2023 6-10  0 - 5 RBC/hpf Final   WBC, UA 11/12/2023 0-5  0 - 5 WBC/hpf Final   Bacteria, UA 11/12/2023 MANY (A)  NONE SEEN Final   Squamous Epithelial / HPF 11/12/2023 0-5  0 - 5 /HPF Final   Mucus 11/12/2023 PRESENT   Final   Performed at [facility], [address] [place], [place] [address]  Admission on 11/02/2023, Discharged on 11/03/2023  Component Date Value Ref Range Status   Troponin I (High Sensitivity) 11/02/2023 34 (H)  <18 ng/L Final   Comment: (NOTE) Elevated high sensitivity troponin I (hsTnI) values and significant  changes across serial measurements may suggest ACS but many other  chronic and acute conditions are known to elevate hsTnI results.  Refer to the "Links" section for chest pain algorithms and additional  guidance. Performed at [facility], [address] [place], [place] [address]    Opiates 11/02/2023 NONE DETECTED  NONE DETECTED Final   Cocaine 11/02/2023 POSITIVE (A)  NONE DETECTED Final   Benzodiazepines 11/02/2023 NONE DETECTED  NONE DETECTED Final   Amphetamines 11/02/2023 POSITIVE (A)  NONE DETECTED Final   Comment: (NOTE) Trazodone is metabolized in vivo to several metabolites, including pharmacologically active m-CPP, which is excreted in the urine. Immunoassay screens for amphetamines and MDMA have potential cross-reactivity with these compounds and may  provide false positive  results.     Tetrahydrocannabinol 11/02/2023 POSITIVE (A)  NONE DETECTED Final   Barbiturates 11/02/2023 NONE DETECTED  NONE DETECTED Final   Comment: (NOTE) DRUG SCREEN FOR MEDICAL PURPOSES ONLY.  IF CONFIRMATION IS NEEDED FOR ANY PURPOSE, NOTIFY LAB WITHIN 5 DAYS.  LOWEST DETECTABLE LIMITS FOR URINE DRUG SCREEN Drug Class                     Cutoff (ng/mL) Amphetamine and metabolites    1000 Barbiturate and metabolites    200 Benzodiazepine                 200 Opiates and metabolites        300 Cocaine and metabolites        300 THC                            50 Performed at [facility], [address], [place], [place] [address]    hCG, Conley Rolls, Sharene Butters, Kathie Rhodes 11/02/2023 <1  <5 mIU/mL Final   Comment:          GEST. AGE      CONC.  (mIU/mL)   <=1 WEEK        5 - 50     2 WEEKS       50 - 500     3 WEEKS       100 - 10,000     4 WEEKS     1,000 - 30,000     5 WEEKS     3,500 - 115,000   6-8 WEEKS     12,000 - 270,000    12 WEEKS     15,000 - 220,000        FEMALE AND NON-PREGNANT FEMALE:     LESS THAN 5 mIU/mL Performed at [facility], [address], [place], [place] [address]    Cholesterol 11/02/2023 197  0 - 200 mg/dL Final   Triglycerides 65/78/4696 74  <150 mg/dL Final   HDL 29/52/8413 80  >40 mg/dL Final   Total CHOL/HDL Ratio 11/02/2023 2.5  RATIO Final   VLDL 11/02/2023 15  0 - 40 mg/dL Final   LDL Cholesterol 11/02/2023 102 (H)  0 - 99 mg/dL Final   Comment:        Total Cholesterol/HDL:CHD Risk Coronary Heart Disease Risk Table                     Men   Women  1/2 Average Risk   3.4   3.3  Average Risk       5.0   4.4  2 X Average Risk   9.6   7.1  3 X Average Risk  23.4   11.0        Use the calculated Patient Ratio above and the CHD Risk Table to determine the patient's CHD Risk.  ATP III CLASSIFICATION (LDL):  <100     mg/dL   Optimal  829-562  mg/dL   Near or Above                    Optimal  130-159   mg/dL   Borderline  130-865  mg/dL   High  >784     mg/dL   Very High Performed at [facility], [address], [place], [place] [address]    TSH 11/02/2023 1.609  0.350 - 4.500 uIU/mL Final   Comment: Performed by a 3rd Generation assay with a functional sensitivity of <=0.01 uIU/mL. Performed at [facility], [address], [place], [place] [address]    Troponin I (High Sensitivity) 11/02/2023 37 (H)  <18 ng/L Final   Comment: (NOTE) Elevated high sensitivity troponin I (hsTnI) values and significant  changes across serial measurements may suggest ACS but many other  chronic and acute conditions are known to elevate hsTnI results.  Refer to the "Links" section for chest pain algorithms and additional  guidance. Performed at [facility], [address], [place], [place] [address]   Admission on 11/02/2023, Discharged on 11/02/2023  Component Date Value Ref Range Status   WBC 11/02/2023 9.2  4.0 - 10.5 K/uL Final   RBC 11/02/2023 3.77 (L)  3.87 - 5.11 MIL/uL Final   Hemoglobin 11/02/2023 12.9  12.0 - 15.0 g/dL Final   HCT 07/01/2535 36.7  36.0 - 46.0 % Final   MCV 11/02/2023 97.3  80.0 - 100.0 fL Final   MCH 11/02/2023 34.2 (H)  26.0 - 34.0 pg Final   MCHC 11/02/2023 35.1  30.0 - 36.0 g/dL Final   RDW 64/40/3474 12.7  11.5 - 15.5 % Final   Platelets 11/02/2023 390  150 - 400 K/uL Final   nRBC 11/02/2023 0.0  0.0 - 0.2 % Final   Neutrophils Relative % 11/02/2023 48  % Final   Neutro Abs 11/02/2023 4.5  1.7 - 7.7 K/uL Final   Lymphocytes Relative 11/02/2023 41  % Final   Lymphs Abs 11/02/2023 3.8  0.7 - 4.0 K/uL Final   Monocytes Relative 11/02/2023 6  % Final   Monocytes Absolute 11/02/2023 0.5  0.1 - 1.0 K/uL Final   Eosinophils Relative 11/02/2023 4  % Final   Eosinophils Absolute 11/02/2023 0.3  0.0 - 0.5 K/uL Final   Basophils Relative 11/02/2023 1  % Final   Basophils Absolute 11/02/2023 0.1  0.0 - 0.1 K/uL Final   Immature Granulocytes 11/02/2023 0   % Final   Abs Immature Granulocytes 11/02/2023 0.03  0.00 - 0.07 K/uL Final   Performed at [facility], [address], [place], [place] [address]   Sodium 11/02/2023 140  135 - 145 mmol/L Final   Potassium 11/02/2023 3.4 (L)  3.5 - 5.1 mmol/L Final   Chloride 11/02/2023 103  98 - 111 mmol/L Final   CO2 11/02/2023 23  22 - 32 mmol/L Final   Glucose, Bld 11/02/2023 81  70 - 99 mg/dL Final   Glucose reference range applies only to samples taken after fasting for at least 8 hours.   BUN 11/02/2023 6  6 - 20 mg/dL Final   Creatinine, Ser 11/02/2023 0.75  0.44 - 1.00 mg/dL Final   Calcium 38/75/6433 9.8  8.9 - 10.3 mg/dL Final   Total Protein 29/51/8841 7.7  6.5 - 8.1 g/dL Final   Albumin 66/02/3015 3.9  3.5 - 5.0 g/dL  Final   AST 11/02/2023 47 (H)  15 - 41 U/L Final   ALT 11/02/2023 33  0 - 44 U/L Final   Alkaline Phosphatase 11/02/2023 53  38 - 126 U/L Final   Total Bilirubin 11/02/2023 0.9  0.0 - 1.2 mg/dL Final   GFR, Estimated 11/02/2023 >60  >60 mL/min Final   Comment: (NOTE) Calculated using the CKD-EPI Creatinine Equation (2021)    Anion gap 11/02/2023 14  5 - 15 Final   Performed at [facility], [address], [place], [place] [address]   Hgb A1c MFr Bld 11/02/2023 4.4 (L)  4.8 - 5.6 % Final   Comment: (NOTE) Pre diabetes:          5.7%-6.4%  Diabetes:              >6.4%  Glycemic control for   <7.0% adults with diabetes    Mean Plasma Glucose 11/02/2023 79.58  mg/dL Final   Performed at [facility], [address] [place], [place] [address]   Magnesium 11/02/2023 2.1  1.7 - 2.4 mg/dL Final   Performed at [facility], [address], [place], [place] [address]   Alcohol, Ethyl (B) 11/02/2023 <10  <10 mg/dL Final   Comment: (NOTE) Lowest detectable limit for serum alcohol is 10 mg/dL.  For medical purposes only. Performed at [facility], [address], [place], [place] [address]    POC Amphetamine UR 11/02/2023 None Detected  NONE  DETECTED (Cut Off Level 1000 ng/mL) Final   POC Secobarbital (BAR) 11/02/2023 None Detected  NONE DETECTED (Cut Off Level 300 ng/mL) Final   POC Buprenorphine (BUP) 11/02/2023 None Detected  NONE DETECTED (Cut Off Level 10 ng/mL) Final   POC Oxazepam (BZO) 11/02/2023 None Detected  NONE DETECTED (Cut Off Level 300 ng/mL) Final   POC Cocaine UR 11/02/2023 Positive (A)  NONE DETECTED (Cut Off Level 300 ng/mL) Final   POC Methamphetamine UR 11/02/2023 Positive (A)  NONE DETECTED (Cut Off Level 1000 ng/mL) Final   POC Morphine 11/02/2023 None Detected  NONE DETECTED (Cut Off Level 300 ng/mL) Final   POC Methadone UR 11/02/2023 None Detected  NONE DETECTED (Cut Off Level 300 ng/mL) Final   POC Oxycodone UR 11/02/2023 None Detected  NONE DETECTED (Cut Off Level 100 ng/mL) Final   POC Marijuana UR 11/02/2023 Positive (A)  NONE DETECTED (Cut Off Level 50 ng/mL) Final   Blood Alcohol level:  Lab Results  Component Value Date   ETH <10 11/12/2023   ETH <10 11/02/2023   Metabolic Disorder Labs: Lab Results  Component Value Date   HGBA1C 4.4 (L) 11/02/2023   MPG 79.58 11/02/2023   No results found for: "PROLACTIN" Lab Results  Component Value Date   CHOL 197 11/02/2023   TRIG 74 11/02/2023   HDL 80 11/02/2023   CHOLHDL 2.5 11/02/2023   VLDL 15 11/02/2023   LDLCALC 102 (H) 11/02/2023   LDLCALC 127 (H) 12/08/2009   Therapeutic Lab Levels: No results found for: "LITHIUM" No results found for: "VALPROATE" No results found for: "CBMZ"  Physical Findings   GAD-7    Flowsheet Row Counselor from 11/12/2023 in [facility]  Total GAD-7 Score 15      PHQ2-9    Flowsheet Row Counselor from 11/12/2023 in [facility] ED from [facility]/03/2023 in [facility]  PHQ-2 Total Score 5 0  PHQ-9 Total Score 18 --  Flowsheet Row ED from 11/13/2023 in [facility] ED from 11/12/2023 in  [facility] ED from 11/03/2023 in [facility]  C-SSRS RISK CATEGORY No Risk No Risk No Risk      Musculoskeletal  Strength & Muscle Tone: within normal limits Gait & Station: normal Patient leans: N/A  Psychiatric Specialty Exam  Presentation  General Appearance:  Appropriate for Environment; Casual  Eye Contact: Good  Speech: Clear and Coherent  Speech Volume: Normal  Handedness: Right  Mood and Affect  Mood: Anxious; Depressed  Affect: Appropriate; Congruent  Thought Process  Thought Processes: Coherent  Descriptions of Associations:Intact  Orientation:Full (Time, Place and Person)  Thought Content:Logical  Diagnosis of Schizophrenia or Schizoaffective disorder in past: No    Hallucinations:Hallucinations: None  Ideas of Reference:None  Suicidal Thoughts:Suicidal Thoughts: No  Homicidal Thoughts:Homicidal Thoughts: No  Sensorium  Memory: Immediate Good; Recent Good  Judgment: Fair  Insight: Fair  Art therapist  Concentration: Good  Attention Span: Good  Recall: Good  Fund of Knowledge: Fair  Language: Good  Psychomotor Activity  Psychomotor Activity: Psychomotor Activity: Normal  Assets  Assets: Communication Skills; Desire for Improvement; Leisure Time; Resilience; Physical Health  Sleep  Sleep: Sleep: Poor Number of Hours of Sleep: 3  Nutritional Assessment (For OBS and FBC admissions only) Has the patient had a weight loss or gain of 10 pounds or more in the last 3 months?: No Has the patient had a decrease in food intake/or appetite?: No Does the patient have dental problems?: No Has the patient recently lost weight without trying?: 0 Has the patient been eating poorly because of a decreased appetite?: 0 Malnutrition Screening Tool Score: 0   Physical Exam  Physical Exam Vitals and nursing note reviewed.  Constitutional:      General: She is not  in acute distress.    Appearance: Normal appearance. She is not ill-appearing or toxic-appearing.  HENT:     Head: Normocephalic.     Right Ear: External ear normal.     Left Ear: External ear normal.     Nose: Nose normal.     Mouth/Throat:     Mouth: Mucous membranes are moist.     Pharynx: Oropharynx is clear.  Eyes:     Extraocular Movements: Extraocular movements intact.  Cardiovascular:     Rate and Rhythm: Normal rate.     Pulses: Normal pulses.  Pulmonary:     Effort: Pulmonary effort is normal.  Abdominal:     Comments: Deferred  Genitourinary:    Comments: Deferred  Musculoskeletal:        General: Normal range of motion.     Cervical back: Normal range of motion.  Skin:    General: Skin is warm.  Neurological:     General: No focal deficit present.     Mental Status: She is alert and oriented to person, place, and time.  Psychiatric:        Mood and Affect: Mood normal.        Behavior: Behavior normal.        Thought Content: Thought content normal.    Review of Systems  Constitutional:  Positive for diaphoresis. Negative for chills and fever.  HENT:  Negative for sore throat.   Eyes:  Negative for blurred vision and double vision.  Respiratory:  Negative for cough, sputum production, shortness of breath and wheezing.   Cardiovascular:  Negative for chest pain and palpitations.  Gastrointestinal:  Negative for abdominal pain, heartburn, nausea and vomiting.  Genitourinary:  Negative for dysuria, frequency and urgency.  Musculoskeletal:  Negative for myalgias.       Shaking  Skin:  Negative for itching and rash.  Neurological:  Positive for headaches. Negative for dizziness, tingling and tremors.  Endo/Heme/Allergies:        See allergy listing  Psychiatric/Behavioral:  Positive for depression and substance abuse. Negative for hallucinations and suicidal ideas. The patient is nervous/anxious. The patient does not have insomnia.    Blood pressure 126/79,  pulse 84, temperature 98.3 F (36.8 C), temperature source Oral, resp. rate 18, last menstrual period 10/19/2023, SpO2 100%, unknown if currently breastfeeding. There is no height or weight on file to calculate BMI.  Treatment Plan Summary: Daily contact with patient to assess and evaluate symptoms and progress in treatment and Medication management  Plans: Medications: --Continue Zoloft tablet 50 mg po daily for depression and anxiety. --Continue Trazodone tablet 50 mg po at bedtime PRN for insomnia -- Continue Ativan Detox Protocol   Cecilie Lowers, FNP 11/14/2023 1:17 PM

## 2023-11-14 NOTE — ED Notes (Signed)
 Pt resting in group room, coloring. No s/sx of distress. No further concerns.

## 2023-11-14 NOTE — ED Notes (Signed)
 Pt going outside now for group therapy. No s/sx of distress, no concerns. Pt remains calm and pleasant.

## 2023-11-14 NOTE — Group Note (Signed)
 Group Topic: Relapse and Recovery  Group Date: 11/14/2023 Start Time: 1130 End Time: 1200 Facilitators: Jerald Kief, RN  Department: [facility]  Number of Participants: 7  Group Focus: safety plan Treatment Modality:  Psychoeducation Interventions utilized were assignment Purpose: express feelings and relapse prevention strategies  Name: Carla Rollins Date of Birth: 1995-11-08  MR: [id number]    Level of Participation: active Quality of Participation: cooperative, offered feedback, and supportive Interactions with others: gave feedback Mood/Affect: appropriate and positive Triggers (if applicable): n/a Cognition: coherent/clear and concrete Progress: Moderate Response: Pt completed her safety plan. She states that her warning signs consist of: binge eating. She states that thinking about her child is her coping mechanism and it helps to ground her. She believes her kid is her main source of motivation to stay sober and to now relapse. She states that if she were to need help, she can always ask her dad. Her safe space is with her kid and also at her friends house, whom doesn't do drugs and has been assisting her with getting clean.  Plan: follow-up needed: pt will follow up with SW and provider regarding what the next step is in the discharge process  Patients Problems:  Patient Active Problem List   Diagnosis Date Noted   Substance induced mood disorder (HCC) 11/13/2023   Alcohol use disorder 11/02/2023   Vaginal delivery 05/07/2014   Post-dates pregnancy 05/04/2014   Marijuana abuse 10/17/2013   Supervision of normal pregnancy in first trimester 10/09/2013   Overweight 07/27/2009   Hearing loss 07/27/2009   ECZEMA 07/27/2009

## 2023-11-14 NOTE — ED Notes (Signed)
 Pt up in day room watching tv and eating breakfast, breathing is even and unlabored. Pt denies SI, HI, pain and AVH.  Will continue to monitor for safety and report any COC.

## 2023-11-14 NOTE — Group Note (Signed)
 Group Topic: Understanding Self  Group Date: 11/14/2023 Start Time: 2030 End Time: 2100 Facilitators: Lauro Clementina Mareno, NT  Department: [facility]  Number of Participants: 8  Group Focus: affirmation and clarity of thought Treatment Modality:  Cognitive Behavioral Therapy Interventions utilized were clarification, leisure development, and support Purpose: express feelings, increase insight, and regain self-worth  Name: Carla Rollins Date of Birth: 1995/12/21  MR: [id number]    Level of Participation: active Quality of Participation: attentive and cooperative Interactions with others: gave feedback Mood/Affect: appropriate Triggers (if applicable): N/A Cognition: coherent/clear Progress: Gaining insight Response: N/A Plan: patient will be encouraged to keep attending group.  Patients Problems:  Patient Active Problem List   Diagnosis Date Noted   Substance induced mood disorder (HCC) 11/13/2023   Alcohol use disorder 11/02/2023   Vaginal delivery 05/07/2014   Post-dates pregnancy 05/04/2014   Marijuana abuse 10/17/2013   Supervision of normal pregnancy in first trimester 10/09/2013   Overweight 07/27/2009   Hearing loss 07/27/2009   ECZEMA 07/27/2009

## 2023-11-15 DIAGNOSIS — F4310 Post-traumatic stress disorder, unspecified: Secondary | ICD-10-CM | POA: Diagnosis not present

## 2023-11-15 DIAGNOSIS — F1020 Alcohol dependence, uncomplicated: Secondary | ICD-10-CM | POA: Diagnosis not present

## 2023-11-15 DIAGNOSIS — F329 Major depressive disorder, single episode, unspecified: Secondary | ICD-10-CM | POA: Diagnosis not present

## 2023-11-15 MED ORDER — NALTREXONE HCL 50 MG PO TABS: 50.0000 mg | ORAL_TABLET | Freq: Every day | ORAL | 0 refills | Status: DC

## 2023-11-15 MED ORDER — NALTREXONE HCL 50 MG PO TABS
50.0000 mg | ORAL_TABLET | Freq: Every day | ORAL | Status: DC
  Administered 2023-11-15 – 2023-11-16 (×2): 50 mg via ORAL
  Filled 2023-11-15: qty 1
  Filled 2023-11-15: qty 14
  Filled 2023-11-15: qty 1

## 2023-11-15 MED ORDER — TRAZODONE HCL 50 MG PO TABS: 50.0000 mg | ORAL_TABLET | Freq: Every evening | ORAL | 0 refills | Status: DC | PRN

## 2023-11-15 MED ORDER — NICOTINE 21 MG/24HR TD PT24: 21.0000 mg | MEDICATED_PATCH | Freq: Every day | TRANSDERMAL | 0 refills | Status: DC

## 2023-11-15 MED ORDER — ADULT MULTIVITAMIN W/MINERALS CH: 1.0000 | ORAL_TABLET | Freq: Every day | ORAL | Status: DC

## 2023-11-15 MED ORDER — SERTRALINE HCL 50 MG PO TABS: 50.0000 mg | ORAL_TABLET | Freq: Every day | ORAL | 0 refills | Status: DC

## 2023-11-15 MED ORDER — VITAMIN B-1 100 MG PO TABS: 100.0000 mg | ORAL_TABLET | Freq: Every day | ORAL | Status: DC

## 2023-11-15 NOTE — ED Notes (Signed)
Pt observed/assessed in room sleeping. RR even and unlabored, appearing in no noted distress. Environmental check complete, will continue to monitor for safety 

## 2023-11-15 NOTE — ED Notes (Signed)
 Patient was provided breakfast

## 2023-11-15 NOTE — Discharge Instructions (Signed)
 Patient will be discharging to [facility] on tomorrow 11/16/2023 by 9:00am with transportation provided via [employer]. Address is [address] [place], [place] [address].    [facility] [address], [place], [address] [phone] phone  New Patient Assessment/Therapy Walk-Ins:  Monday and Wednesday: 8 am until slots are full. Every 1st and 2nd Fridays of the month: 1 pm - 5 pm.  NO ASSESSMENT/THERAPY WALK-INS ON TUESDAYS OR THURSDAYS  New Patient Assessment/Medication Management Walk-Ins:  Monday - Friday:  8 am - 11 am.  For all walk-ins, we ask that you arrive by 7:30 am because patients will be seen in the order of arrival.  Availability is limited; therefore, you may not be seen on the same day that you walk-in.  Our goal is to serve and meet the needs of our community to the best of our [place] ability.  SUBSTANCE USE TREATMENT for Medicaid and State Funded/IPRS  [facility] [address], [place], [address] [phone] phone NOTE: [facility] is no longer offering IOP services.  Serves those who are low-income or have no insurance.  [facility] [address], [place], [place], [address] [phone] phone [phone] fax NOTE: Does have Substance Abuse-Intensive Outpatient Program [facility]) as well as transitional housing if eligible.  [facility] [address] [place], [place], [address] [phone] phone [phone] fax  [facility] [phone] [place], [place], [address] [phone] phone [phone] fax  HALFWAY HOUSES:  [facility] [phone]  [employer].oxfordvacancies.com  12 STEP PROGRAMS:  Alcoholics Anonymous of [place] [employer]et[employer]be  Narcotics Anonymous of [place] [employer]dk  Al-Anon of [place], [place] www.[place]alanon.org/find-meetings.html  Nar-Anon  https://nar-anon.org/find-a-meetin  List of Residential placements:   [facility] in [place]: [phone]  [facility]: [phone]  [place], [place] [phone]: Female and female facility; 30-day program: (uninsured and Medicaid such as Laurena Bering, [place], [place], partners)  [facility]: [phone]; men and women's facility; 28 days; Can have Medicaid tailored plan Tour manager or Partners)  [facility]: [phone] Karoline Caldwell or Larita Fife; 28 day program; must be fully detox; tailored Medicaid or no insurance  [address] in [place], [place]; [phone]; 28 day all males program; no insurance accepted  [facility] Referral in [place]: Gabriel Rung [phone] (no insurance or Medicaid only); 90 days; outpatient services but provide housing in apartments [place]  [facility] Admission: [phone]: Patient must complete phone screening for placement: [place], [place]; 6 month program; uninsured, Medicaid, and [employer].   [facility]: no insurance required; [phone]  [facility]: [phone]; Intake: Molly Maduro; Must fill out application online; Alecia Lemming Delay [phone] x [address] [facility] in [place], [place]: [phone]; Admissions Coordinators Mr. Maurine Minister or Barron Alvine; 90 day program.  [facility]: [place], [place] [phone]; Co-Ed 9 month to a year program; Online application; Men entry fee is $500 (6-29months);  [employer]: [address] [place], [place] [address]; no fee or insurance required; minimum of 2 years; Highly structured; work based; Intake Coordinator is Thayer Ohm [phone]  [facility] in [place], [place]: [phone]; Fax number is [phone]; website: www.[facility].org; Requires 3-6 page autobiography; 2 year program (18 months and then 35month transitional housing); Admission fee is $300; no insurance needed; work  Automotive engineer in [place], [place]: [employer] Desk Staff: Danise Edge [phone]: They have a Men's Regenerations Program 6-635months. Free program; There is an initial $300 fee however, they  are willing to work with patients regarding that. Application is online.  [facility]: Admissions [phone] Doran Heater ext [phone]; Any 7-90 day program is out of pocket; 12 month program is free of charge; there is a $275 entry fee; Patient is responsible for own transportation

## 2023-11-15 NOTE — Group Note (Signed)
 Group Topic: Wellness  Group Date: 11/15/2023 Start Time: 1145 End Time: 1200 Facilitators: Wonda Cheng, LPN  Department: [facility]  Number of Participants: 8  Group Focus: nursing group Treatment Modality:  Patient-Centered Therapy Interventions utilized were group exercise and support Purpose: increase insight  Name: Carla Rollins Date of Birth: 11/24/1995  MR: [id number]    Level of Participation: active Quality of Participation: attentive and cooperative Interactions with others: gave feedback Mood/Affect: appropriate and positive Triggers (if applicable): n/a Cognition: coherent/clear and concrete Progress: Gaining insight Response: appropriate response to therapy Plan: patient will be encouraged to continue coping skills learned  Patients Problems:  Patient Active Problem List   Diagnosis Date Noted   Substance induced mood disorder (HCC) 11/13/2023   Alcohol use disorder 11/02/2023   Vaginal delivery 05/07/2014   Post-dates pregnancy 05/04/2014   Marijuana abuse 10/17/2013   Supervision of normal pregnancy in first trimester 10/09/2013   Overweight 07/27/2009   Hearing loss 07/27/2009   ECZEMA 07/27/2009

## 2023-11-15 NOTE — Group Note (Signed)
 Group Topic: Relapse and Recovery  Group Date: 11/15/2023 Start Time: 0800 End Time: 2051 Facilitators: Quinn Axe, NT  Department: [facility]  Number of Participants: 6  Group Focus: chemical dependency education, discharge education, and feeling awareness/expression Treatment Modality:  Cognitive Behavioral Therapy Interventions utilized were clarification, confrontation, patient education, and story telling Purpose: express feelings, increase insight, reinforce self-care, and relapse prevention strategies  Name: Carla Rollins Date of Birth: July 11, 1996  MR: [id number]    Level of Participation: not present Quality of Participation: n/a Interactions with others: n/a Mood/Affect: n/a Triggers (if applicable): n/a Cognition: n/a Progress: None Response: n/a Plan: patient will be encouraged to to attend all scheduled meetings on the unit.  Patients Problems:  Patient Active Problem List   Diagnosis Date Noted   Substance induced mood disorder (HCC) 11/13/2023   Alcohol use disorder 11/02/2023   Vaginal delivery 05/07/2014   Post-dates pregnancy 05/04/2014   Marijuana abuse 10/17/2013   Supervision of normal pregnancy in first trimester 10/09/2013   Overweight 07/27/2009   Hearing loss 07/27/2009   ECZEMA 07/27/2009

## 2023-11-15 NOTE — ED Notes (Signed)
 Pt currently in day room participating in social work group. Pt a/o, ambulatory. Denies SI, HI, AVH, & pain at this time. PRN pain medication administered for a 7/10 head and back ache. Medication effective. Will continue to monitor for safety and report any COC.

## 2023-11-15 NOTE — Group Note (Signed)
 Group Topic: Spirituality in Recovery  Group Date: 11/15/2023 Start Time: 1045 End Time: 1150 Facilitators: Loleta Dicker, LCSW  Department: [facility]  Number of Participants: 4  Group Focus: clarity of thought, communication, reality orientation, and self-awareness Treatment Modality:  Cognitive Behavioral Therapy and Spiritual Interventions utilized were story telling and support Purpose: express feelings, increase insight, regain self-worth, and reinforce self-care  Name: Carla Rollins Date of Birth: 11-Sep-1995  MR: [id number]    Level of Participation: active Quality of Participation: attentive and cooperative Interactions with others: gave feedback Mood/Affect: appropriate Triggers (if applicable): Past experiences and familiar environments  Cognition: coherent/clear Progress: Gaining insight Description of Group:  This group will address the importance of considering the journey and not just the destination. Patients will be encouraged to process areas in their lives where they found it hard to focus on the good rather than the bad, and identify reasons for maintaining such thought pattern. Facilitator will guide patients utilizing problem- solving interventions to address and improve the way each patient views themselves and their situation. Patients will work through understanding and applying humility when it comes to life changes and the interactions we have with others. Patients will be encouraged to explore ways that they will move forward throughout life's journey, and make healthier decisions for themselves.   Therapeutic Goals: Patient will identify two or more emotions or situations that they observed or felt throughout watching the video clip. Patient will identify signs where they may have responded to someone or situation due to their current circumstance.  Patient will identify two ways to set better habits in order to achieve more peace in  their lives. Patient will demonstrate ability to communicate their needs through discussion and/or role plays.  Summary of Patient Progress: Patient actively participated in group on today. Patient reports that the video clip definitely made her reflect on her life and the choices she has made in the past. Patient reports she wants to gain a better relationship with God so that she can live a better lifestyle. Patient reports feeling encouraged from watching the video, and she was able to provide feedback to staff and peers.   Plan: referral / recommendations  Patients Problems:  Patient Active Problem List   Diagnosis Date Noted   Substance induced mood disorder (HCC) 11/13/2023   Alcohol use disorder 11/02/2023   Vaginal delivery 05/07/2014   Post-dates pregnancy 05/04/2014   Marijuana abuse 10/17/2013   Supervision of normal pregnancy in first trimester 10/09/2013   Overweight 07/27/2009   Hearing loss 07/27/2009   ECZEMA 07/27/2009

## 2023-11-15 NOTE — Discharge Planning (Signed)
 Referral was received and per Marcelino Duster, patient has been accepted and can transfer to the facility on tomorrow 11/16/2023 by 9:00am. Update has been provided to the patient and MD made aware. Patient will need a 7-14 day supply of medication and one month refill. No nicotine gum allowed, however 14-30 day nicotine patches to be provided if needed. No other needs to report at this time.    LCSW will continue to follow up and provide updates as received.    Fernande Boyden, LCSW Clinical Social Worker [facility] Ph: [phone]

## 2023-11-15 NOTE — ED Provider Notes (Signed)
 Behavioral Health Progress Note  Date and Time: 11/15/2023 8:34 AM Name: Carla Rollins MRN:  [id number]  Reason for admission: Carla Rollins, 28 y.o., AA female patient presents to [facility] as a walk in  unaccompanied requesting alcohol detox.  Per chart review patient was admitted to the [facility] 11/03/2023-11/06/2023 and discharged with instructions to follow-up with the [facility] program.  She was prescribed sertraline 50 mg daily, naltrexone 50 mg daily, and trazodone 50 mg nightly.  She is unemployed and lives with her sister.  She has a 31-year-old son who lives with his father.    Subjective: Patient was evaluated in her room.  She reports her sleep has greatly improved overnight, believes she has benefited from trazodone.  She does admit to keeping her nicotine patch on, causing her to have some bizarre dreams.  Patient was educated on importance of removing nicotine patch prior to going to sleep, she is in agreement.  She is still reporting cravings for cigarettes and alcohol, discussed adding naltrexone, patient is in agreement.  Otherwise, patient reports her mood has improved.  She is also reporting adequate appetite.  On interview, suicidal ideations are not present. Thoughts of self harm are not present. Homicidal ideations are not present.   There are no auditory hallucinations, visual hallucinations, paranoid ideations, or delusional thought processes.   Side effects to currently prescribed medications are none. There are no somatic complaints. Reports regular bowel movements..     Total Time spent with patient: 45 minutes  Past Psychiatric History: See H&P Past Medical History: See H&P Family History: See H&P Family Psychiatric  History: See H&P Social History:  See H&P Additional Social History:See H&P  Current Medications:  Current Facility-Administered Medications  Medication Dose Route Frequency Provider Last Rate Last Admin   acetaminophen (TYLENOL) tablet 650 mg  650 mg Oral Q6H  PRN Ardis Hughs, NP   650 mg at 11/14/23 0912   alum & mag hydroxide-simeth (MAALOX/MYLANTA) 200-200-20 MG/5ML suspension 30 mL  30 mL Oral Q4H PRN Ardis Hughs, NP       haloperidol (HALDOL) tablet 5 mg  5 mg Oral TID PRN Ardis Hughs, NP       And   diphenhydrAMINE (BENADRYL) capsule 50 mg  50 mg Oral TID PRN Ardis Hughs, NP       haloperidol lactate (HALDOL) injection 5 mg  5 mg Intramuscular TID PRN Ardis Hughs, NP       And   diphenhydrAMINE (BENADRYL) injection 50 mg  50 mg Intramuscular TID PRN Ardis Hughs, NP       And   LORazepam (ATIVAN) injection 2 mg  2 mg Intramuscular TID PRN Ardis Hughs, NP       haloperidol lactate (HALDOL) injection 10 mg  10 mg Intramuscular TID PRN Ardis Hughs, NP       And   diphenhydrAMINE (BENADRYL) injection 50 mg  50 mg Intramuscular TID PRN Ardis Hughs, NP       And   LORazepam (ATIVAN) injection 2 mg  2 mg Intramuscular TID PRN Ardis Hughs, NP       hydrOXYzine (ATARAX) tablet 25 mg  25 mg Oral Q6H PRN Ardis Hughs, NP   25 mg at 11/14/23 2124   loperamide (IMODIUM) capsule 2-4 mg  2-4 mg Oral PRN Ardis Hughs, NP       LORazepam (ATIVAN) tablet 1 mg  1 mg Oral Q6H PRN Vernard Gambles  H, NP       magnesium hydroxide (MILK OF MAGNESIA) suspension 30 mL  30 mL Oral Daily PRN Ardis Hughs, NP       multivitamin with minerals tablet 1 tablet  1 tablet Oral Daily Ardis Hughs, NP   1 tablet at 11/14/23 0910   nicotine (NICODERM CQ - dosed in mg/24 hours) patch 21 mg  21 mg Transdermal Daily Carrion-Carrero, Kamori Barbier, MD   21 mg at 11/14/23 1148   ondansetron (ZOFRAN-ODT) disintegrating tablet 4 mg  4 mg Oral Q6H PRN Ardis Hughs, NP       sertraline (ZOLOFT) tablet 50 mg  50 mg Oral Daily Ardis Hughs, NP   50 mg at 11/14/23 0910   thiamine (VITAMIN B1) tablet 100 mg  100 mg Oral Daily Ardis Hughs, NP   100 mg at 11/14/23 0911   traZODone  (DESYREL) tablet 50 mg  50 mg Oral QHS PRN Ardis Hughs, NP   50 mg at 11/14/23 2123   Current Outpatient Medications  Medication Sig Dispense Refill   sertraline (ZOLOFT) 50 MG tablet Take 1 tablet (50 mg total) by mouth daily.      Labs  Lab Results:  Admission on 11/12/2023, Discharged on 11/13/2023  Component Date Value Ref Range Status   WBC 11/12/2023 8.9  4.0 - 10.5 K/uL Final   RBC 11/12/2023 4.28  3.87 - 5.11 MIL/uL Final   Hemoglobin 11/12/2023 14.4  12.0 - 15.0 g/dL Final   HCT 16/06/9603 41.6  36.0 - 46.0 % Final   MCV 11/12/2023 97.2  80.0 - 100.0 fL Final   MCH 11/12/2023 33.6  26.0 - 34.0 pg Final   MCHC 11/12/2023 34.6  30.0 - 36.0 g/dL Final   RDW 54/05/8118 12.1  11.5 - 15.5 % Final   Platelets 11/12/2023 364  150 - 400 K/uL Final   nRBC 11/12/2023 0.0  0.0 - 0.2 % Final   Neutrophils Relative % 11/12/2023 44  % Final   Neutro Abs 11/12/2023 3.9  1.7 - 7.7 K/uL Final   Lymphocytes Relative 11/12/2023 45  % Final   Lymphs Abs 11/12/2023 3.9  0.7 - 4.0 K/uL Final   Monocytes Relative 11/12/2023 5  % Final   Monocytes Absolute 11/12/2023 0.5  0.1 - 1.0 K/uL Final   Eosinophils Relative 11/12/2023 5  % Final   Eosinophils Absolute 11/12/2023 0.5  0.0 - 0.5 K/uL Final   Basophils Relative 11/12/2023 1  % Final   Basophils Absolute 11/12/2023 0.1  0.0 - 0.1 K/uL Final   Immature Granulocytes 11/12/2023 0  % Final   Abs Immature Granulocytes 11/12/2023 0.02  0.00 - 0.07 K/uL Final   Performed at [facility], [address], [place], [place] [address]   Sodium 11/12/2023 139  135 - 145 mmol/L Final   Potassium 11/12/2023 4.3  3.5 - 5.1 mmol/L Final   Chloride 11/12/2023 105  98 - 111 mmol/L Final   CO2 11/12/2023 25  22 - 32 mmol/L Final   Glucose, Bld 11/12/2023 80  70 - 99 mg/dL Final   Glucose reference range applies only to samples taken after fasting for at least 8 hours.   BUN 11/12/2023 7  6 - 20 mg/dL Final   Creatinine, Ser 11/12/2023 0.76   0.44 - 1.00 mg/dL Final   Calcium 95/62/1308 9.7  8.9 - 10.3 mg/dL Final   Total Protein 65/78/4696 7.5  6.5 - 8.1 g/dL Final  Albumin 11/12/2023 4.1  3.5 - 5.0 g/dL Final   AST 09/81/1914 41  15 - 41 U/L Final   ALT 11/12/2023 27  0 - 44 U/L Final   Alkaline Phosphatase 11/12/2023 54  38 - 126 U/L Final   Total Bilirubin 11/12/2023 0.6  0.0 - 1.2 mg/dL Final   GFR, Estimated 11/12/2023 >60  >60 mL/min Final   Comment: (NOTE) Calculated using the CKD-EPI Creatinine Equation (2021)    Anion gap 11/12/2023 9  5 - 15 Final   Performed at [facility], [address] [place], [place] [address]   Alcohol, Ethyl (B) 11/12/2023 <10  <10 mg/dL Final   Comment: (NOTE) Lowest detectable limit for serum alcohol is 10 mg/dL.  For medical purposes only. Performed at [facility], [address] [place], [place] [address]    Preg Test, Ur 11/12/2023 Negative  Negative Final   POC Amphetamine UR 11/12/2023 None Detected  NONE DETECTED (Cut Off Level 1000 ng/mL) Final   POC Secobarbital (BAR) 11/12/2023 None Detected  NONE DETECTED (Cut Off Level 300 ng/mL) Final   POC Buprenorphine (BUP) 11/12/2023 None Detected  NONE DETECTED (Cut Off Level 10 ng/mL) Final   POC Oxazepam (BZO) 11/12/2023 None Detected  NONE DETECTED (Cut Off Level 300 ng/mL) Final   POC Cocaine UR 11/12/2023 Positive (A)  NONE DETECTED (Cut Off Level 300 ng/mL) Final   POC Methamphetamine UR 11/12/2023 None Detected  NONE DETECTED (Cut Off Level 1000 ng/mL) Final   POC Morphine 11/12/2023 None Detected  NONE DETECTED (Cut Off Level 300 ng/mL) Final   POC Methadone UR 11/12/2023 None Detected  NONE DETECTED (Cut Off Level 300 ng/mL) Final   POC Oxycodone UR 11/12/2023 None Detected  NONE DETECTED (Cut Off Level 100 ng/mL) Final   POC Marijuana UR 11/12/2023 Positive (A)  NONE DETECTED (Cut Off Level 50 ng/mL) Final   Color, Urine 11/12/2023 AMBER (A)  YELLOW Final   BIOCHEMICALS MAY BE AFFECTED BY COLOR    APPearance 11/12/2023 HAZY (A)  CLEAR Final   Specific Gravity, Urine 11/12/2023 1.027  1.005 - 1.030 Final   pH 11/12/2023 5.0  5.0 - 8.0 Final   Glucose, UA 11/12/2023 NEGATIVE  NEGATIVE mg/dL Final   Hgb urine dipstick 11/12/2023 LARGE (A)  NEGATIVE Final   Bilirubin Urine 11/12/2023 NEGATIVE  NEGATIVE Final   Ketones, ur 11/12/2023 NEGATIVE  NEGATIVE mg/dL Final   Protein, ur 86/57/8469 NEGATIVE  NEGATIVE mg/dL Final   Nitrite 62/95/2841 POSITIVE (A)  NEGATIVE Final   Leukocytes,Ua 11/12/2023 NEGATIVE  NEGATIVE Final   RBC / HPF 11/12/2023 6-10  0 - 5 RBC/hpf Final   WBC, UA 11/12/2023 0-5  0 - 5 WBC/hpf Final   Bacteria, UA 11/12/2023 MANY (A)  NONE SEEN Final   Squamous Epithelial / HPF 11/12/2023 0-5  0 - 5 /HPF Final   Mucus 11/12/2023 PRESENT   Final   Performed at [facility], [address], [place], [place] [address]  Admission on 11/02/2023, Discharged on 11/03/2023  Component Date Value Ref Range Status   Troponin I (High Sensitivity) 11/02/2023 34 (H)  <18 ng/L Final   Comment: (NOTE) Elevated high sensitivity troponin I (hsTnI) values and significant  changes across serial measurements may suggest ACS but many other  chronic and acute conditions are known to elevate hsTnI results.  Refer to the "Links" section for chest pain algorithms and additional  guidance. Performed at [facility], [address] [place], [place] [address]    Opiates 11/02/2023 NONE DETECTED  NONE DETECTED Final   Cocaine 11/02/2023 POSITIVE (A)  NONE DETECTED Final   Benzodiazepines 11/02/2023 NONE DETECTED  NONE DETECTED Final   Amphetamines 11/02/2023 POSITIVE (A)  NONE DETECTED Final   Comment: (NOTE) Trazodone is metabolized in vivo to several metabolites, including pharmacologically active m-CPP, which is excreted in the urine. Immunoassay screens for amphetamines and MDMA have potential cross-reactivity with these compounds and may provide false positive  results.      Tetrahydrocannabinol 11/02/2023 POSITIVE (A)  NONE DETECTED Final   Barbiturates 11/02/2023 NONE DETECTED  NONE DETECTED Final   Comment: (NOTE) DRUG SCREEN FOR MEDICAL PURPOSES ONLY.  IF CONFIRMATION IS NEEDED FOR ANY PURPOSE, NOTIFY LAB WITHIN 5 DAYS.  LOWEST DETECTABLE LIMITS FOR URINE DRUG SCREEN Drug Class                     Cutoff (ng/mL) Amphetamine and metabolites    1000 Barbiturate and metabolites    200 Benzodiazepine                 200 Opiates and metabolites        300 Cocaine and metabolites        300 THC                            50 Performed at [facility], [address], [place], [place] [address]    hCG, Conley Rolls, Sharene Butters, Kathie Rhodes 11/02/2023 <1  <5 mIU/mL Final   Comment:          GEST. AGE      CONC.  (mIU/mL)   <=1 WEEK        5 - 50     2 WEEKS       50 - 500     3 WEEKS       100 - 10,000     4 WEEKS     1,000 - 30,000     5 WEEKS     3,500 - 115,000   6-8 WEEKS     12,000 - 270,000    12 WEEKS     15,000 - 220,000        FEMALE AND NON-PREGNANT FEMALE:     LESS THAN 5 mIU/mL Performed at [facility], [address], [place], [place] [address]    Cholesterol 11/02/2023 197  0 - 200 mg/dL Final   Triglycerides 21/30/8657 74  <150 mg/dL Final   HDL 84/69/6295 80  >40 mg/dL Final   Total CHOL/HDL Ratio 11/02/2023 2.5  RATIO Final   VLDL 11/02/2023 15  0 - 40 mg/dL Final   LDL Cholesterol 11/02/2023 102 (H)  0 - 99 mg/dL Final   Comment:        Total Cholesterol/HDL:CHD Risk Coronary Heart Disease Risk Table                     Men   Women  1/2 Average Risk   3.4   3.3  Average Risk       5.0   4.4  2 X Average Risk   9.6   7.1  3 X Average Risk  23.4   11.0        Use the calculated Patient Ratio above and the CHD Risk Table to determine the patient's CHD Risk.  ATP III CLASSIFICATION (LDL):  <100     mg/dL   Optimal  161-096  mg/dL   Near or Above                    Optimal  130-159  mg/dL   Borderline  045-409  mg/dL    High  >811     mg/dL   Very High Performed at [facility], [address] [place], [place] [address]    TSH 11/02/2023 1.609  0.350 - 4.500 uIU/mL Final   Comment: Performed by a 3rd Generation assay with a functional sensitivity of <=0.01 uIU/mL. Performed at [facility], [address] [place], [place] [address]    Troponin I (High Sensitivity) 11/02/2023 37 (H)  <18 ng/L Final   Comment: (NOTE) Elevated high sensitivity troponin I (hsTnI) values and significant  changes across serial measurements may suggest ACS but many other  chronic and acute conditions are known to elevate hsTnI results.  Refer to the "Links" section for chest pain algorithms and additional  guidance. Performed at [facility], [address], [place], [place] [address]   Admission on 11/02/2023, Discharged on 11/02/2023  Component Date Value Ref Range Status   WBC 11/02/2023 9.2  4.0 - 10.5 K/uL Final   RBC 11/02/2023 3.77 (L)  3.87 - 5.11 MIL/uL Final   Hemoglobin 11/02/2023 12.9  12.0 - 15.0 g/dL Final   HCT 57/84/6962 36.7  36.0 - 46.0 % Final   MCV 11/02/2023 97.3  80.0 - 100.0 fL Final   MCH 11/02/2023 34.2 (H)  26.0 - 34.0 pg Final   MCHC 11/02/2023 35.1  30.0 - 36.0 g/dL Final   RDW 95/28/4132 12.7  11.5 - 15.5 % Final   Platelets 11/02/2023 390  150 - 400 K/uL Final   nRBC 11/02/2023 0.0  0.0 - 0.2 % Final   Neutrophils Relative % 11/02/2023 48  % Final   Neutro Abs 11/02/2023 4.5  1.7 - 7.7 K/uL Final   Lymphocytes Relative 11/02/2023 41  % Final   Lymphs Abs 11/02/2023 3.8  0.7 - 4.0 K/uL Final   Monocytes Relative 11/02/2023 6  % Final   Monocytes Absolute 11/02/2023 0.5  0.1 - 1.0 K/uL Final   Eosinophils Relative 11/02/2023 4  % Final   Eosinophils Absolute 11/02/2023 0.3  0.0 - 0.5 K/uL Final   Basophils Relative 11/02/2023 1  % Final   Basophils Absolute 11/02/2023 0.1  0.0 - 0.1 K/uL Final   Immature Granulocytes 11/02/2023 0  % Final   Abs Immature Granulocytes  11/02/2023 0.03  0.00 - 0.07 K/uL Final   Performed at [facility], [address] [place], [place] [address]   Sodium 11/02/2023 140  135 - 145 mmol/L Final   Potassium 11/02/2023 3.4 (L)  3.5 - 5.1 mmol/L Final   Chloride 11/02/2023 103  98 - 111 mmol/L Final   CO2 11/02/2023 23  22 - 32 mmol/L Final   Glucose, Bld 11/02/2023 81  70 - 99 mg/dL Final   Glucose reference range applies only to samples taken after fasting for at least 8 hours.   BUN 11/02/2023 6  6 - 20 mg/dL Final   Creatinine, Ser 11/02/2023 0.75  0.44 - 1.00 mg/dL Final   Calcium 27/25/3664 9.8  8.9 - 10.3 mg/dL Final   Total Protein 40/34/7425 7.7  6.5 - 8.1 g/dL Final   Albumin 95/63/8756 3.9  3.5 - 5.0 g/dL  Final   AST 11/02/2023 47 (H)  15 - 41 U/L Final   ALT 11/02/2023 33  0 - 44 U/L Final   Alkaline Phosphatase 11/02/2023 53  38 - 126 U/L Final   Total Bilirubin 11/02/2023 0.9  0.0 - 1.2 mg/dL Final   GFR, Estimated 11/02/2023 >60  >60 mL/min Final   Comment: (NOTE) Calculated using the CKD-EPI Creatinine Equation (2021)    Anion gap 11/02/2023 14  5 - 15 Final   Performed at [facility], [address] [place], [place] [address]   Hgb A1c MFr Bld 11/02/2023 4.4 (L)  4.8 - 5.6 % Final   Comment: (NOTE) Pre diabetes:          5.7%-6.4%  Diabetes:              >6.4%  Glycemic control for   <7.0% adults with diabetes    Mean Plasma Glucose 11/02/2023 79.58  mg/dL Final   Performed at [facility], [address], [place], [place] [address]   Magnesium 11/02/2023 2.1  1.7 - 2.4 mg/dL Final   Performed at [facility], [address] [place], [place] [address]   Alcohol, Ethyl (B) 11/02/2023 <10  <10 mg/dL Final   Comment: (NOTE) Lowest detectable limit for serum alcohol is 10 mg/dL.  For medical purposes only. Performed at [facility], [address] [place], [place] [address]    POC Amphetamine UR 11/02/2023 None Detected  NONE DETECTED (Cut Off Level 1000 ng/mL)  Final   POC Secobarbital (BAR) 11/02/2023 None Detected  NONE DETECTED (Cut Off Level 300 ng/mL) Final   POC Buprenorphine (BUP) 11/02/2023 None Detected  NONE DETECTED (Cut Off Level 10 ng/mL) Final   POC Oxazepam (BZO) 11/02/2023 None Detected  NONE DETECTED (Cut Off Level 300 ng/mL) Final   POC Cocaine UR 11/02/2023 Positive (A)  NONE DETECTED (Cut Off Level 300 ng/mL) Final   POC Methamphetamine UR 11/02/2023 Positive (A)  NONE DETECTED (Cut Off Level 1000 ng/mL) Final   POC Morphine 11/02/2023 None Detected  NONE DETECTED (Cut Off Level 300 ng/mL) Final   POC Methadone UR 11/02/2023 None Detected  NONE DETECTED (Cut Off Level 300 ng/mL) Final   POC Oxycodone UR 11/02/2023 None Detected  NONE DETECTED (Cut Off Level 100 ng/mL) Final   POC Marijuana UR 11/02/2023 Positive (A)  NONE DETECTED (Cut Off Level 50 ng/mL) Final   Blood Alcohol level:  Lab Results  Component Value Date   ETH <10 11/12/2023   ETH <10 11/02/2023   Metabolic Disorder Labs: Lab Results  Component Value Date   HGBA1C 4.4 (L) 11/02/2023   MPG 79.58 11/02/2023   No results found for: "PROLACTIN" Lab Results  Component Value Date   CHOL 197 11/02/2023   TRIG 74 11/02/2023   HDL 80 11/02/2023   CHOLHDL 2.5 11/02/2023   VLDL 15 11/02/2023   LDLCALC 102 (H) 11/02/2023   LDLCALC 127 (H) 12/08/2009   Therapeutic Lab Levels: No results found for: "LITHIUM" No results found for: "VALPROATE" No results found for: "CBMZ"  Physical Findings   GAD-7    Flowsheet Row Counselor from 11/12/2023 in [facility]  Total GAD-7 Score 15      PHQ2-9    Flowsheet Row Counselor from 11/12/2023 in [facility] ED from 11/03/2023 in [facility]  PHQ-2 Total Score 5 0  PHQ-9 Total Score 18 --  Flowsheet Row ED from 11/13/2023 in [facility] ED from 11/12/2023 in [facility] ED  from 11/03/2023 in [facility]  C-SSRS RISK CATEGORY No Risk No Risk No Risk      Musculoskeletal  Strength & Muscle Tone: within normal limits Gait & Station: normal Patient leans: N/A  Psychiatric Specialty Exam  Presentation  General Appearance:  Appropriate for Environment; Casual  Eye Contact: Good  Speech: Clear and Coherent  Speech Volume: Normal  Handedness: not assessed  Mood and Affect  Mood: "Good"  Affect: Bright affect; congruent  Thought Process  Thought Processes: Coherent  Descriptions of Associations:Intact  Orientation:Full (Time, Place and Person)  Thought Content:Logical  Diagnosis of Schizophrenia or Schizoaffective disorder in past: No    Hallucinations:Hallucinations: None  Ideas of Reference:None  Suicidal Thoughts:Suicidal Thoughts: No  Homicidal Thoughts:Homicidal Thoughts: No  Sensorium  Memory: Immediate Good; Recent Good  Judgment: Fair  Insight: Fair  Art therapist  Concentration: Good  Attention Span: Good  Recall: Good  Fund of Knowledge: Fair  Language: Good  Psychomotor Activity  Psychomotor Activity: Psychomotor Activity: Normal  Assets  Assets: Communication Skills; Desire for Improvement; Leisure Time; Resilience; Physical Health  Sleep  Sleep: Good  Nutritional Assessment (For OBS and FBC admissions only) Has the patient had a weight loss or gain of 10 pounds or more in the last 3 months?: No Has the patient had a decrease in food intake/or appetite?: No Does the patient have dental problems?: No Has the patient recently lost weight without trying?: 0 Has the patient been eating poorly because of a decreased appetite?: 0 Malnutrition Screening Tool Score: 0   Physical Exam  Physical Exam Vitals and nursing note reviewed.  Constitutional:      General: She is not in acute distress.    Appearance: Normal appearance. She is not ill-appearing or  toxic-appearing.  HENT:     Head: Normocephalic and atraumatic.  Cardiovascular:     Rate and Rhythm: Normal rate.  Pulmonary:     Effort: Pulmonary effort is normal. No respiratory distress.  Musculoskeletal:        General: Normal range of motion.     Cervical back: Normal range of motion.  Skin:    General: Skin is warm and dry.  Neurological:     Mental Status: She is alert.    Review of Systems  All other systems reviewed and are negative.  Blood pressure 121/74, pulse 72, temperature 98.1 F (36.7 C), temperature source Oral, resp. rate 18, last menstrual period 10/19/2023, SpO2 100%, unknown if currently breastfeeding. There is no height or weight on file to calculate BMI.  Treatment Plan Summary: Daily contact with patient to assess and evaluate symptoms and progress in treatment and Medication management  Plans:  Alcohol Use Disorder -CIWA with Ativan as needed for CIWA greater than 10  -Last CIWA score is 3 on 3/12 at 9 PM -Thiamine 100 mg IM first day and PO after that -Multivitamin with minerals daily -Tylenol 650 mg every 6 hours as needed for pain -Zofran 4 mg every 6 hours as needed for nausea or vomiting -Imodium 2 to 4 mg as needed for diarrhea or loose stools  -Maalox/Mylanta 30 mL every 4 hours as needed for indigestion -Milk of Mag 30 mL as needed for constipation   Substance-induced mood disorder MDD PTSD --Continue Zoloft tablet 50 mg po daily for depression and anxiety. --Continue Trazodone tablet 50 mg po  at bedtime PRN for insomnia   Dis[facility]: [facility]   Signed: Lorri Frederick, MD 11/15/2023 8:34 AM

## 2023-11-16 DIAGNOSIS — F1020 Alcohol dependence, uncomplicated: Secondary | ICD-10-CM | POA: Diagnosis not present

## 2023-11-16 DIAGNOSIS — F329 Major depressive disorder, single episode, unspecified: Secondary | ICD-10-CM | POA: Diagnosis not present

## 2023-11-16 DIAGNOSIS — F4310 Post-traumatic stress disorder, unspecified: Secondary | ICD-10-CM | POA: Diagnosis not present

## 2023-11-16 NOTE — ED Notes (Signed)
 Pt is in the bedroom sleeping comfortably and stable as well. Will continue to monitor for safety.

## 2023-11-16 NOTE — ED Notes (Signed)
 Patient alert & oriented x4. Denies intent to harm self or others when asked. Denies A/VH. Patient reports pain 8/10, PRN Tylenol administered with no complications. No acute distress noted. Patient planned to discharge to [facility] this AM. Patient voices understanding of plan. Support and encouragement provided. Routine safety checks conducted per facility protocol. Encouraged patient to notify staff if any thoughts of harm towards self or others arise. Patient verbalizes understanding and agreement.

## 2023-11-16 NOTE — ED Provider Notes (Signed)
 FBC/OBS ASAP Discharge Summary  Date and Time: 11/16/2023 11:04 AM  Name: Carla Rollins  MRN:  [id number]   Discharge Diagnoses:  Final diagnoses:  Alcohol use disorder, severe, dependence [facility])    Stay Summary:   Carla Rollins, 28 y.o., AA female patient presents to [facility] as a walk in unaccompanied requesting alcohol detox. Per chart review patient was admitted to the [facility] 11/03/2023-11/06/2023 and discharged with instructions to follow-up with the CD IOP program. She was prescribed sertraline 50 mg daily, naltrexone 50 mg daily, and trazodone 50 mg nightly. She is unemployed and lives with her sister. She has a 32-year-old son who lives with his father.   During the patient's hospitalization at the [facility], patient had extensive initial psychiatric evaluation, with daily follow-up assessments focused on detoxification management.  Alcohol Use Disorder Patient was placed on CIWA's with as needed medications.  Stable to discontinued on day of discharge -Thiamine 100 mg IM first day and PO after that -Multivitamin with minerals daily -Tylenol 650 mg every 6 hours as needed for pain -Zofran 4 mg every 6 hours as needed for nausea or vomiting -Imodium 2 to 4 mg as needed for diarrhea or loose stools  -Maalox/Mylanta 30 mL every 4 hours as needed for indigestion -Milk of Mag 30 mL as needed for constipation    Substance-induced mood disorder MDD PTSD Continue Zoloft tablet 50 mg po daily for depression and anxiety. Continue Trazodone tablet 50 mg po at bedtime PRN for insomnia  Patient's care was discussed during the interdisciplinary team meeting every day during the hospitalization.  The patient denies having side effects to prescribed psychiatric medication.  Gradually, patient started adjusting to milieu. The patient was evaluated each day by a clinical provider to ascertain response to treatment. Improvement was noted by the patient's report of decreasing symptoms, improved sleep and  appetite, affect, medication tolerance, behavior, and participation in unit programming.  Patient was asked each day to complete a self inventory noting mood, mental status, pain, new symptoms, anxiety and concerns.    Symptoms were reported as significantly decreased or resolved completely by discharge.   On day of discharge, the patient reports that their mood is stable. The patient denied having suicidal thoughts for more than 48 hours prior to discharge.  Patient denies having homicidal thoughts.  Patient denies having auditory hallucinations.  Patient denies any visual hallucinations or other symptoms of psychosis. The patient was motivated to continue taking medication with a goal of continued improvement in mental health.   The patient reported that their withdrawal symptoms and cravings responded well to the detox regimen, with overall benefit from the detox program. Supportive psychotherapy was provided, and the patient participated in regular group therapy sessions focused on managing cravings and withdrawal. Coping skills, problem-solving, and relaxation techniques were also part of the program's therapeutic interventions.  Labs were reviewed with the patient, and abnormal results were discussed with the patient.  The patient is able to verbalize their individual safety plan to this provider.  # It is recommended to the patient to continue psychiatric medications as prescribed, after discharge from the hospital.    # It is recommended to the patient to follow up with their outpatient psychiatric provider and PCP.  # It was discussed with the patient, the impact of alcohol, drugs, tobacco have been there overall psychiatric and medical wellbeing, and total abstinence from substance use was recommended.  # Prescriptions provided or sent directly to preferred pharmacy at discharge. Patient agreeable  to plan. Given opportunity to ask questions. Appears to feel comfortable with discharge.     # In the event of worsening symptoms, the patient is instructed to call the crisis hotline, 911 and or go to the nearest ED for appropriate evaluation and treatment of symptoms. To follow-up with primary care provider for other medical issues, concerns and or health care needs  # Patient was discharged [facility] with a plan to follow up as noted below.        HPI on admission: patient presented to [facility] as a walk in  unaccompanied requesting alcohol detox.     Carla Rollins, 28 y.o., female patient seen face to face by this provider, consulted with Dr. Enedina Finner; and chart reviewed on 11/12/23.  Per chart review patient was admitted to the [facility] 11/03/2023-11/06/2023 and discharged with instructions to follow-up with the CD IOP program.  She was prescribed sertraline 50 mg daily, naltrexone 50 mg daily, and trazodone 50 mg nightly.  She is unemployed and lives with her sister.  She has a 46-year-old son who lives with his father.   Patient was referred to [facility] from Rubbie Battiest SAIOP at [facility] on second floor, per chart review, "Mellonie presents today as she was scheduled for a CCA to begin SAIOP.  Harnoor says she knows she told the [facility] downstairs that she wanted SAIOP but she says she needs residential.  Selina says that she drank 1/5 of liquor last night and her last drink was at 2 am this morning."   On evaluation Carla Rollins reports she was referred SA IOP program, because she told them that she wanted residential treatment.  They contacted [facility] but [facility] states patient will need to complete alcohol detox first.  Carla Rollins reports completing alcohol detox in the [facility] 11/03/2023-11/06/2023.Marland Kitchen  Upon discharge from the facility based crisis unit she went back into the same crowd of people.  The very next day she relapsed.  She admits to snorting roughly $20 worth of cocaine daily.  She has been drinking about 1/fifth-handle of tequila per day.  She denies any history of  alcohol withdrawal seizure or delirium tremor.  She identifies irritability and anxiety as her current alcohol withdrawal symptoms.  Her last alcohol consumption was this a.m.  She was sent prescriptions to pharmacy but never picked them up.  She endorses daily use of marijuana and cigarettes.  She is requesting residential substance abuse treatment.  She denies any legal issues.   During evaluation Carla Rollins is observed sitting in the assessment room in no acute distress.  She is alert/oriented x 4,cooperative, and attentive.  She appears anxious.  She has normal speech.  She is casually dressed and makes fair eye contact.  She endorses depression with feelings of sadness, isolating, tearfulness, helplessness, hopelessness, worthlessness, guilt and decreased sleep.  She has a depressed affect.  Her biggest stressor/trigger is her substance use, financial problems, homelessness and not having her son.  She denies any suicidal or homicidal ideations.  She denies auditory/visual hallucinations.  She does not appear manic, psychotic, delusional, or paranoid.  She does not appear to be responding to internal/external stimuli.  She is able to answer questions appropriately.   Patient will be admitted to the continuous assessment unit and will be reevaluated by the psychiatry in the a.m. Patient agreeable.  Patient may be a potential candidate for [facility].  Total Time spent with patient: 15 minutes  Social History:  Patient  reports that she was born in [place] and raised between [place] and [place]. She denies any issues with walking or talking as a child. She stopped going to school in the 11th grade. She is currently single and she has one son. She is unemployed and currently homeless but she does have family she may be able to stay with. She reports previous criminal charges.    Per chart: Name of Substance: Alcohol Age of first use: "Pretty much my whole life" Amount using: 1 bottle of tequila a  day Frequency of use: Daily  How long using: 6 months Last used: Today  Withdrawal symptoms: Tremors, sweating, nausea and headaches Longest period of sobriety:   Name of Substance: Cocaine Age of first use: 28 yrs old Amount using: $20 worth Frequency of use: Daily How long using:4 years Last used: Yesterday Withdrawal symptoms: Tiredness Longest period of sobriety: A few days detox in [facility] from 11/03/2023-11/06/2023  Current Medications:  Current Facility-Administered Medications  Medication Dose Route Frequency Provider Last Rate Last Admin   acetaminophen (TYLENOL) tablet 650 mg  650 mg Oral Q6H PRN Ardis Hughs, NP   650 mg at 11/16/23 0744   alum & mag hydroxide-simeth (MAALOX/MYLANTA) 200-200-20 MG/5ML suspension 30 mL  30 mL Oral Q4H PRN Ardis Hughs, NP       haloperidol (HALDOL) tablet 5 mg  5 mg Oral TID PRN Ardis Hughs, NP       And   diphenhydrAMINE (BENADRYL) capsule 50 mg  50 mg Oral TID PRN Ardis Hughs, NP       haloperidol lactate (HALDOL) injection 5 mg  5 mg Intramuscular TID PRN Ardis Hughs, NP       And   diphenhydrAMINE (BENADRYL) injection 50 mg  50 mg Intramuscular TID PRN Ardis Hughs, NP       And   LORazepam (ATIVAN) injection 2 mg  2 mg Intramuscular TID PRN Ardis Hughs, NP       haloperidol lactate (HALDOL) injection 10 mg  10 mg Intramuscular TID PRN Ardis Hughs, NP       And   diphenhydrAMINE (BENADRYL) injection 50 mg  50 mg Intramuscular TID PRN Ardis Hughs, NP       And   LORazepam (ATIVAN) injection 2 mg  2 mg Intramuscular TID PRN Ardis Hughs, NP       magnesium hydroxide (MILK OF MAGNESIA) suspension 30 mL  30 mL Oral Daily PRN Ardis Hughs, NP       multivitamin with minerals tablet 1 tablet  1 tablet Oral Daily Ardis Hughs, NP   1 tablet at 11/16/23 0838   naltrexone (DEPADE) tablet 50 mg  50 mg Oral Daily Rollins, Carla Posa, MD   50 mg at 11/16/23 9604    nicotine (NICODERM CQ - dosed in mg/24 hours) patch 21 mg  21 mg Transdermal Daily Rollins, Eretria Manternach, MD   21 mg at 11/16/23 0838   sertraline (ZOLOFT) tablet 50 mg  50 mg Oral Daily Ardis Hughs, NP   50 mg at 11/16/23 5409   thiamine (VITAMIN B1) tablet 100 mg  100 mg Oral Daily Ardis Hughs, NP   100 mg at 11/16/23 8119   traZODone (DESYREL) tablet 50 mg  50 mg Oral QHS PRN Ardis Hughs, NP   50 mg at 11/14/23 2123   Current Outpatient Medications  Medication Sig Dispense Refill   Multiple Vitamin (MULTIVITAMIN WITH  MINERALS) TABS tablet Take 1 tablet by mouth daily.     naltrexone (DEPADE) 50 MG tablet Take 1 tablet (50 mg total) by mouth daily. 30 tablet 0   nicotine (NICODERM CQ - DOSED IN MG/24 HOURS) 21 mg/24hr patch Place 1 patch (21 mg total) onto the skin daily. 28 patch 0   sertraline (ZOLOFT) 50 MG tablet Take 1 tablet (50 mg total) by mouth daily. 30 tablet 0   thiamine (VITAMIN B-1) 100 MG tablet Take 1 tablet (100 mg total) by mouth daily.     traZODone (DESYREL) 50 MG tablet Take 1 tablet (50 mg total) by mouth at bedtime as needed for sleep. 30 tablet 0    PTA Medications:  PTA Medications  Medication Sig   Multiple Vitamin (MULTIVITAMIN WITH MINERALS) TABS tablet Take 1 tablet by mouth daily.   thiamine (VITAMIN B-1) 100 MG tablet Take 1 tablet (100 mg total) by mouth daily.   naltrexone (DEPADE) 50 MG tablet Take 1 tablet (50 mg total) by mouth daily.   traZODone (DESYREL) 50 MG tablet Take 1 tablet (50 mg total) by mouth at bedtime as needed for sleep.   sertraline (ZOLOFT) 50 MG tablet Take 1 tablet (50 mg total) by mouth daily.   nicotine (NICODERM CQ - DOSED IN MG/24 HOURS) 21 mg/24hr patch Place 1 patch (21 mg total) onto the skin daily.   Facility Ordered Medications  Medication   acetaminophen (TYLENOL) tablet 650 mg   alum & mag hydroxide-simeth (MAALOX/MYLANTA) 200-200-20 MG/5ML suspension 30 mL   [EXPIRED] hydrOXYzine (ATARAX) tablet  25 mg   [EXPIRED] loperamide (IMODIUM) capsule 2-4 mg   [EXPIRED] LORazepam (ATIVAN) tablet 1 mg   magnesium hydroxide (MILK OF MAGNESIA) suspension 30 mL   multivitamin with minerals tablet 1 tablet   [EXPIRED] ondansetron (ZOFRAN-ODT) disintegrating tablet 4 mg   sertraline (ZOLOFT) tablet 50 mg   thiamine (VITAMIN B1) tablet 100 mg   traZODone (DESYREL) tablet 50 mg   haloperidol (HALDOL) tablet 5 mg   And   diphenhydrAMINE (BENADRYL) capsule 50 mg   haloperidol lactate (HALDOL) injection 5 mg   And   diphenhydrAMINE (BENADRYL) injection 50 mg   And   LORazepam (ATIVAN) injection 2 mg   haloperidol lactate (HALDOL) injection 10 mg   And   diphenhydrAMINE (BENADRYL) injection 50 mg   And   LORazepam (ATIVAN) injection 2 mg   nicotine (NICODERM CQ - dosed in mg/24 hours) patch 21 mg   naltrexone (DEPADE) tablet 50 mg       11/15/2023    3:55 PM 11/12/2023   11:09 AM 11/06/2023   10:32 AM  Depression screen PHQ 2/9  Decreased Interest 1 2 0  Down, Depressed, Hopeless 1 3 0  PHQ - 2 Score 2 5 0  Altered sleeping 2 3   Tired, decreased energy 1 3   Change in appetite 1 3   Feeling bad or failure about yourself  0 3   Trouble concentrating 0 1   Moving slowly or fidgety/restless 0 0   Suicidal thoughts 0 0   PHQ-9 Score 6 18   Difficult doing work/chores Somewhat difficult      Flowsheet Row ED from 11/13/2023 in [facility] ED from 11/12/2023 in [facility] ED from 11/03/2023 in [facility]  C-SSRS RISK CATEGORY No Risk No Risk No Risk       Musculoskeletal  Strength & Muscle Tone: within normal limits  Gait & Station: normal Patient leans: N/A  Psychiatric Specialty Exam  Presentation  General Appearance:  Appropriate for Environment  Eye Contact: Good  Speech: Clear and Coherent; Normal Rate  Speech Volume: Normal  Handedness: -- (not assessed)   Mood and Affect   Mood: Euthymic  Affect: Congruent; Full Range   Thought Process  Thought Processes: Linear  Descriptions of Associations:Intact  Orientation:None  Thought Content:Logical  Diagnosis of Schizophrenia or Schizoaffective disorder in past: No    Hallucinations:Hallucinations: None  Ideas of Reference:None  Suicidal Thoughts:Suicidal Thoughts: No  Homicidal Thoughts:Homicidal Thoughts: No   Sensorium  Memory: Immediate Good; Recent Good; Remote Good  Judgment: Fair  Insight: Fair   Art therapist  Concentration: Good  Attention Span: Good  Recall: Good  Fund of Knowledge: Good  Language: Good   Psychomotor Activity  Psychomotor Activity:Psychomotor Activity: Normal   Assets  Assets: Desire for Improvement; Resilience; Communication Skills   Sleep  Sleep:Sleep: Good     Physical Exam  Physical Exam Vitals and nursing note reviewed.  Constitutional:      General: She is not in acute distress.    Appearance: She is not ill-appearing.  HENT:     Head: Normocephalic and atraumatic.  Eyes:     Conjunctiva/sclera: Conjunctivae normal.  Pulmonary:     Effort: Pulmonary effort is normal. No respiratory distress.  Musculoskeletal:        General: Normal range of motion.  Skin:    General: Skin is warm and dry.  Neurological:     General: No focal deficit present.    Review of Systems  All other systems reviewed and are negative.  Blood pressure 121/72, pulse 74, temperature 98.4 F (36.9 C), temperature source Oral, resp. rate 18, last menstrual period 10/19/2023, SpO2 100%, unknown if currently breastfeeding. There is no height or weight on file to calculate BMI.  Demographic Factors:  Low socioeconomic status  Loss Factors: NA  Historical Factors: Impulsivity  Risk Reduction Factors:   Positive social support, Positive therapeutic relationship, and Positive coping skills or problem solving skills  Continued Clinical  Symptoms:  Alcohol/Substance Abuse/Dependencies Previous Psychiatric Diagnoses and Treatments  Cognitive Features That Contribute To Risk:  None    Suicide Risk:  Mild:  Suicidal ideation of limited frequency, intensity, duration, and specificity.  There are no identifiable plans, no associated intent, mild dysphoria and related symptoms, good self-control (both objective and subjective assessment), few other risk factors, and identifiable protective factors, including available and accessible social support.  Plan Of Care/Follow-up recommendations:  Activity: as tolerated  Diet: heart healthy  Other: -Follow-up with your outpatient psychiatric provider -instructions on appointment date, time, and address (location) are provided to you in discharge paperwork.  -Take your psychiatric medications as prescribed at discharge - instructions are provided to you in the discharge paperwork  -If you are prescribed an atypical antipsychotic medication, we recommend that your outpatient psychiatrist follow routine screening for side effects within 3 months of discharge, including monitoring: AIMS scale, height, weight, blood pressure, fasting lipid panel, HbA1c, and fasting blood sugar.   -Recommend total abstinence from alcohol, tobacco, and other illicit drug use at discharge.   -If your psychiatric symptoms recur, worsen, or if you have side effects to your psychiatric medications, call your outpatient psychiatric provider, 911, [phone] or go to the nearest emergency department.  -If suicidal thoughts occur, immediately call your outpatient psychiatric provider, 911, [phone] or go to the nearest emergency department.   Disposition: Patient was  discharged to [facility] recovery services for residential rehabilitation  Lorri Frederick, MD 11/16/2023, 11:04 AM

## 2023-11-16 NOTE — Group Note (Signed)
 Group Topic: Recovery Basics  Group Date: 11/16/2023 Start Time: 0800 End Time: 0810 Facilitators: Landon Bassford, Jacklynn Barnacle, RN  Department: [facility]  Number of Participants: 1  Group Focus: discharge education Treatment Modality:  Individual Therapy Interventions utilized were patient education Purpose: increase insight  Name: Carla Rollins Date of Birth: 03/26/1996  MR: [id number]    Level of Participation: active Quality of Participation: attentive and cooperative Interactions with others: gave feedback Mood/Affect: appropriate Triggers (if applicable): None identified Cognition: coherent/clear and logical Progress: Significant Response: Patient voiced understanding of suicide safety plan and all discharge instructions and education regarding [facility] admission. Patient has no questions at this time. Plan: patient will be encouraged to continue treatment process at [facility]. Patient will be encouraged to reach out for additional care if needed and refer to suicide safety plan if thoughts of SI occur.  Patients Problems:  Patient Active Problem List   Diagnosis Date Noted   Substance induced mood disorder (HCC) 11/13/2023   Alcohol use disorder 11/02/2023   Vaginal delivery 05/07/2014   Post-dates pregnancy 05/04/2014   Marijuana abuse 10/17/2013   Supervision of normal pregnancy in first trimester 10/09/2013   Overweight 07/27/2009   Hearing loss 07/27/2009   ECZEMA 07/27/2009

## 2023-11-16 NOTE — ED Notes (Signed)
 Patient discharged to [facility] per MD order. After Visit Summary (AVS) printed and given to patient, as well as printed prescriptions and RX samples. AVS reviewed with patient and all questions fully answered. Patient discharged in no acute distress, A& O x4 and ambulatory. Patient denied SI/HI, A/VH upon discharge. Patient verbalized understanding of all discharge instructions explained by staff, including follow up appointments, RX's and safety plan. Patient mood good.  Patient belongings returned to patient from locker #27 complete and intact. Patient escorted to [place] via staff for transport to destination. Safety maintained.

## 2023-11-16 NOTE — ED Notes (Signed)
 Patient at phone, calm and collected. No acute distress noted. No concerns voiced. Informed patient to notify staff with any needs or assistance. Patient verbalized understanding or agreement. Safety checks in place per facility policy.

## 2023-12-08 ENCOUNTER — Encounter (HOSPITAL_BASED_OUTPATIENT_CLINIC_OR_DEPARTMENT_OTHER): Payer: Self-pay | Admitting: Emergency Medicine

## 2023-12-08 ENCOUNTER — Other Ambulatory Visit: Payer: Self-pay

## 2023-12-08 ENCOUNTER — Emergency Department (HOSPITAL_BASED_OUTPATIENT_CLINIC_OR_DEPARTMENT_OTHER)
Admission: EM | Admit: 2023-12-08 | Discharge: 2023-12-08 | Disposition: A | Discharge status: Home / Self Care | Attending: Emergency Medicine | Admitting: Emergency Medicine

## 2023-12-08 DIAGNOSIS — K029 Dental caries, unspecified: Secondary | ICD-10-CM | POA: Diagnosis not present

## 2023-12-08 DIAGNOSIS — K0889 Other specified disorders of teeth and supporting structures: Secondary | ICD-10-CM | POA: Diagnosis present

## 2023-12-08 DIAGNOSIS — K047 Periapical abscess without sinus: Secondary | ICD-10-CM | POA: Insufficient documentation

## 2023-12-08 MED ORDER — AMOXICILLIN 500 MG PO CAPS: 1000.0000 mg | ORAL_CAPSULE | Freq: Two times a day (BID) | ORAL | 0 refills | Status: DC

## 2023-12-08 MED ORDER — ACETAMINOPHEN 500 MG PO TABS
1000.0000 mg | ORAL_TABLET | Freq: Once | ORAL | Status: AC
  Administered 2023-12-08: 1000 mg via ORAL
  Filled 2023-12-08: qty 2

## 2023-12-08 MED ORDER — AMOXICILLIN 500 MG PO CAPS
1000.0000 mg | ORAL_CAPSULE | Freq: Once | ORAL | Status: AC
  Administered 2023-12-08: 1000 mg via ORAL
  Filled 2023-12-08: qty 2

## 2023-12-08 MED ORDER — IBUPROFEN 600 MG PO TABS: 600.0000 mg | ORAL_TABLET | Freq: Four times a day (QID) | ORAL | 0 refills | Status: DC | PRN

## 2023-12-08 MED ORDER — IBUPROFEN 400 MG PO TABS
600.0000 mg | ORAL_TABLET | Freq: Once | ORAL | Status: AC
  Administered 2023-12-08: 600 mg via ORAL
  Filled 2023-12-08: qty 1

## 2023-12-08 NOTE — ED Provider Notes (Signed)
 [place] [facility] Provider Note   CSN: [id number] Arrival date & time: 12/08/23  1913     History  Chief Complaint  Patient presents with   Dental Pain    Carla Rollins is a 28 y.o. female.  Patient to ED with left lower dental pain that is sharp and severe, that started yesterday. No facial swelling or fever. No nausea. She reports there is a tooth in that area that has been a problem in the past.   The history is provided by the patient. No language interpreter was used.  Dental Pain      Home Medications Prior to Admission medications   Medication Sig Start Date End Date Taking? Authorizing Provider  amoxicillin (AMOXIL) 500 MG capsule Take 2 capsules (1,000 mg total) by mouth 2 (two) times daily. 12/08/23  Yes Joelie Schou, Melvenia Beam, PA-C  ibuprofen (ADVIL) 600 MG tablet Take 1 tablet (600 mg total) by mouth every 6 (six) hours as needed. 12/08/23  Yes Elpidio Anis, PA-C  Multiple Vitamin (MULTIVITAMIN WITH MINERALS) TABS tablet Take 1 tablet by mouth daily. 11/16/23   Carrion-Carrero, Karle Starch, MD  naltrexone (DEPADE) 50 MG tablet Take 1 tablet (50 mg total) by mouth daily. 11/16/23   Carrion-Carrero, Karle Starch, MD  nicotine (NICODERM CQ - DOSED IN MG/24 HOURS) 21 mg/24hr patch Place 1 patch (21 mg total) onto the skin daily. 11/16/23   Carrion-Carrero, Karle Starch, MD  sertraline (ZOLOFT) 50 MG tablet Take 1 tablet (50 mg total) by mouth daily. 11/15/23   Carrion-Carrero, Karle Starch, MD  thiamine (VITAMIN B-1) 100 MG tablet Take 1 tablet (100 mg total) by mouth daily. 11/16/23   Carrion-Carrero, Karle Starch, MD  traZODone (DESYREL) 50 MG tablet Take 1 tablet (50 mg total) by mouth at bedtime as needed for sleep. 11/15/23   Carrion-Carrero, Karle Starch, MD      Allergies    Patient has no known allergies.    Review of Systems   Review of Systems  Physical Exam Updated Vital Signs BP 127/88 (BP Location: Right Arm)   Pulse 97   Temp (!) 97.2 F (36.2 C)    Resp (!) 24   Ht 5\' 7"  (1.702 m)   Wt 76.7 kg   LMP 12/03/2023   SpO2 99%   BMI 26.47 kg/m  Physical Exam Constitutional:      General: She is not in acute distress.    Appearance: She is well-developed. She is not ill-appearing.  HENT:     Mouth/Throat:     Comments: Generally poor dentition. There is severe decay of #20 and partial erosion of #19. There is generalized apical swelling. No facial swelling. No trismus. Pulmonary:     Effort: Pulmonary effort is normal.  Musculoskeletal:        General: Normal range of motion.     Cervical back: Normal range of motion.  Skin:    General: Skin is warm and dry.  Neurological:     Mental Status: She is alert and oriented to person, place, and time.     ED Results / Procedures / Treatments   Labs (all labs ordered are listed, but only abnormal results are displayed) Labs Reviewed - No data to display  EKG None  Radiology No results found.  Procedures Procedures    Medications Ordered in ED Medications  amoxicillin (AMOXIL) capsule 1,000 mg (has no administration in time range)  ibuprofen (ADVIL) tablet 600 mg (has no administration in time range)  acetaminophen (TYLENOL) tablet 1,000  mg (has no administration in time range)    ED Course/ Medical Decision Making/ A&P Clinical Course as of 12/08/23 1941  Sat Dec 08, 2023  1610 Patient with dental pain since yesterday with significant dental decay of the lower rear molars. Will cover with antibiotics, provide ibuprofen and tylenol. She is a resident at [facility] so written prescriptions will be provided.  [SU]    Clinical Course User Index [SU] Elpidio Anis, PA-C                                 Medical Decision Making Risk OTC drugs. Prescription drug management.           Final Clinical Impression(s) / ED Diagnoses Final diagnoses:  Dental caries  Dental abscess    Rx / DC Orders ED Discharge Orders          Ordered    amoxicillin  (AMOXIL) 500 MG capsule  2 times daily        12/08/23 1940    ibuprofen (ADVIL) 600 MG tablet  Every 6 hours PRN        12/08/23 1940              Elpidio Anis, PA-C 12/08/23 1941    Benjiman Core, MD 12/09/23 [phone]

## 2023-12-08 NOTE — Discharge Instructions (Signed)
 Take the antibiotic as prescribed. Take ibuprofen 600 mg every 6 hours for pain. You can also take Tylenol 1000 mg every 6 hours with max dose of 4000 mg per 24 hours.  You will need to follow up with dentistry as soon as possible for definitive care of the dental decay.

## 2023-12-08 NOTE — ED Triage Notes (Signed)
 Pt c/o dental pain (LT lower) since yesterday; pt sts she is from [facility] and cannot have narcotics

## 2023-12-10 ENCOUNTER — Encounter: Payer: Self-pay | Admitting: Physician Assistant

## 2023-12-10 ENCOUNTER — Other Ambulatory Visit (HOSPITAL_COMMUNITY)
Admission: RE | Admit: 2023-12-10 | Discharge: 2023-12-10 | Disposition: A | Discharge status: Home / Self Care | Source: Ambulatory Visit | Attending: Physician Assistant | Admitting: Physician Assistant

## 2023-12-10 ENCOUNTER — Ambulatory Visit: Admitting: Physician Assistant

## 2023-12-10 VITALS — BP 125/69 | HR 82 | Ht 67.0 in | Wt 166.0 lb

## 2023-12-10 DIAGNOSIS — B9689 Other specified bacterial agents as the cause of diseases classified elsewhere: Secondary | ICD-10-CM

## 2023-12-10 DIAGNOSIS — Z113 Encounter for screening for infections with a predominantly sexual mode of transmission: Secondary | ICD-10-CM | POA: Diagnosis present

## 2023-12-10 DIAGNOSIS — K0889 Other specified disorders of teeth and supporting structures: Secondary | ICD-10-CM

## 2023-12-10 DIAGNOSIS — F1010 Alcohol abuse, uncomplicated: Secondary | ICD-10-CM | POA: Diagnosis not present

## 2023-12-10 DIAGNOSIS — A599 Trichomoniasis, unspecified: Secondary | ICD-10-CM

## 2023-12-10 DIAGNOSIS — F1210 Cannabis abuse, uncomplicated: Secondary | ICD-10-CM | POA: Diagnosis not present

## 2023-12-10 DIAGNOSIS — F1410 Cocaine abuse, uncomplicated: Secondary | ICD-10-CM

## 2023-12-10 MED ORDER — VITAMIN B-1 100 MG PO TABS: 100.0000 mg | ORAL_TABLET | Freq: Every day | ORAL | 1 refills | Status: DC

## 2023-12-10 MED ORDER — ADULT MULTIVITAMIN W/MINERALS CH: 1.0000 | ORAL_TABLET | Freq: Every day | ORAL | 1 refills | Status: DC

## 2023-12-10 NOTE — Patient Instructions (Addendum)
 VISIT SUMMARY:  During your visit, we addressed your dental pain, vaginal discharge, and mental health medication refills. We also discussed general health maintenance, including STD screening.  YOUR PLAN:  -DENTAL PAIN: You have ongoing dental pain on the lower left side. This is likely due to an infection or other dental issue. Continue taking amoxicillin and ibuprofen as prescribed, and follow up with a dentist for further evaluation.  -VAGINAL DISCHARGE: You have been experiencing white vaginal discharge with occasional odor. This could be due to a bacterial or yeast infection, or a sexually transmitted infection (STI). We performed a vaginal swab and blood tests to determine the cause. We will wait for the test results before starting any treatment per your request.  -DEPRESSION AND ANXIETY: Your depression and anxiety are stable with your current medications. We will coordinate with psychiatry to ensure your medications are refilled as needed.  -GENERAL HEALTH MAINTENANCE: We performed STD screening as requested due to your past exposure and recent partners. This is important for your overall health and well-being.  Dental Pain: What It Means  Dental pain is often a sign that something is wrong with your teeth or gums. You can also have pain after a dental treatment. If you have dental pain, it's important to contact your dental care provider, especially if you don't know what's causing the pain.  Dental pain may hurt a lot or a little. It can be caused by many things, including: Tooth decay. This is also called cavities or caries. Infection. An abscess. This is when the inner part of the tooth is filled with pus. An injury or a crack in the tooth. Gum disease or gums that move back and expose the root of a tooth. Abnormal grinding or clenching of teeth. Not taking good care of your teeth. Sometimes the cause of pain isn't known. You may have pain all the time, or it may come and go.  It may happen only when you're: Chewing. Exposed to hot or cold temperatures. Eating or drinking foods or drinks with a lot of sugar in them, such as soda or candy. Follow these instructions at home: Medicines Take your medicines only as told. If you were given antibiotics, take them as told. Do not stop taking them even if you start to feel better. Eating and drinking Avoid foods or drinks that cause you pain. These may include: Very hot or very cold foods or drinks. Sweet or sugary foods or drinks. Managing pain and swelling  Use ice or an ice pack on the painful area of your face as told. Put ice in a plastic bag. Place a towel between your skin and the ice. Leave the ice on for 20 minutes, 2-3 times a day. Remove the ice if your skin turns bright red. This is very important. If you cannot feel pain, heat, or cold, you have a greater risk of damage to the area. If your skin turns red, take off the ice right away to prevent skin damage. The risk of damage is higher if you can't feel pain, heat, or cold. Brushing and flossing Brush your teeth twice a day using a fluoride toothpaste. Use a toothpaste made for sensitive teeth as told by your dental care provider. Always use a soft toothbrush. This will help prevent irritation of your gums. Floss your teeth at least once a day. General instructions Do not apply heat to the outside of your face. To cleanse your mouth and help with any irritation and swelling  in the painful area, you can try rinsing with salt water. Swish and gargle with salt water and then spit it out. To make salt water, add a half to a whole spoonful of salt to a glass of warm water. Mix well. You can repeat this every 2-3 hours for pain. Contact a dental care provider if: You have dental pain and you don't know why. Your pain isn't controlled with medicines. Your symptoms get worse. You have new symptoms. Get help right away if: You can't open your mouth. You're  having trouble breathing or swallowing. You have a fever. Your face, neck, or jaw is swollen. These symptoms may be an emergency. Call 911 right away. Do not wait to see if the symptoms will go away. Do not drive yourself to the hospital. This information is not intended to replace advice given to you by your health care provider. Make sure you discuss any questions you have with your health care provider. Document Revised: 07/03/2023 Document Reviewed: 01/18/2023 Elsevier Patient Education  2024 [employer].

## 2023-12-10 NOTE — Progress Notes (Unsigned)
 New Patient Office Visit  Subjective    Patient ID: Carla Rollins, female    DOB: 22-Apr-1996  Age: 28 y.o. MRN: [id number]  CC:  Chief Complaint  Patient presents with   Medication Refill   Dental Pain    Left lower side, tooth pain   STD screening     Patient desires full std screening today. Denies any vaginal discomfort, noticed vaginal discharge.    Discussed the use of AI scribe software for clinical note transcription with the patient, who gave verbal consent to proceed.  History of Present Illness   The patient, currently in a recovery program at [facility] since March 14th, presents with dental pain on the lower left side. She was seen in the emergency department two days ago and was prescribed amoxicillin and ibuprofen, which she started taking yesterday. She has a history of similar dental issues on the same side, which were previously managed with antibiotics and pain medication, but she did not follow up with a dentist for definitive treatment. She expresses a desire to address this issue while in the recovery program.  Additionally, the patient reports having vaginal discharge since February. The discharge is white and has been present throughout March, with a brief respite during her menstrual period. She also reports occasional odor, particularly after using the bathroom, but denies any associated itching. She has a past history of chlamydia, treated two to three years ago, and has had two sexual partners since then, one of whom was a drug dealer, raising concerns about potential exposure to sexually transmitted diseases.    Outpatient Encounter Medications as of 12/10/2023  Medication Sig   amoxicillin (AMOXIL) 500 MG capsule Take 2 capsules (1,000 mg total) by mouth 2 (two) times daily.   ibuprofen (ADVIL) 600 MG tablet Take 1 tablet (600 mg total) by mouth every 6 (six) hours as needed.   Multiple Vitamin (MULTIVITAMIN WITH MINERALS) TABS tablet Take 1  tablet by mouth daily.   naltrexone (DEPADE) 50 MG tablet Take 1 tablet (50 mg total) by mouth daily.   nicotine (NICODERM CQ - DOSED IN MG/24 HOURS) 21 mg/24hr patch Place 1 patch (21 mg total) onto the skin daily.   sertraline (ZOLOFT) 50 MG tablet Take 1 tablet (50 mg total) by mouth daily.   thiamine (VITAMIN B-1) 100 MG tablet Take 1 tablet (100 mg total) by mouth daily.   traZODone (DESYREL) 50 MG tablet Take 1 tablet (50 mg total) by mouth at bedtime as needed for sleep.   No facility-administered encounter medications on file as of 12/10/2023.    Past Medical History:  Diagnosis Date   Asthma    Chlamydia 2013   Infection     Past Surgical History:  Procedure Laterality Date   NO PAST SURGERIES      Family History  Problem Relation Age of Onset   Diabetes Maternal Grandmother    Cancer Maternal Grandfather     Social History   Socioeconomic History   Marital status: Single    Spouse name: Not on file   Number of children: Not on file   Years of education: Not on file   Highest education level: Not on file  Occupational History   Not on file  Tobacco Use   Smoking status: Every Day    Types: Cigarettes   Smokeless tobacco: Never  Vaping Use   Vaping status: Every Day   Substances: Nicotine, THC  Substance and Sexual Activity   Alcohol  use: No   Drug use: No   Sexual activity: Yes    Birth control/protection: Implant    Comment: preg  Other Topics Concern   Not on file  Social History Narrative   Not on file   Social Drivers of Health   Financial Resource Strain: Not on file  Food Insecurity: Food Insecurity Present (11/12/2023)   Hunger Vital Sign    Worried About Running Out of Food in the Last Year: Sometimes true    Ran Out of Food in the Last Year: Sometimes true  Transportation Needs: Unmet Transportation Needs (11/12/2023)   PRAPARE - Administrator, Civil Service (Medical): Yes    Lack of Transportation (Non-Medical): Yes   Physical Activity: Not on file  Stress: Not on file  Social Connections: Not on file  Intimate Partner Violence: Not At Risk (11/12/2023)   Humiliation, Afraid, Rape, and Kick questionnaire    Fear of Current or Ex-Partner: No    Emotionally Abused: No    Physically Abused: No    Sexually Abused: No    ROS      Objective    BP 125/69 (BP Location: Left Arm, Patient Position: Sitting, Cuff Size: Normal)   Pulse 82   Ht 5\' 7"  (1.702 m)   LMP 12/03/2023   BMI 26.47 kg/m   Physical Exam   Physical Exam GENERAL: Alert, cooperative, well developed, no acute distress HEENT: Normocephalic, normal oropharynx, moist mucous membranes CHEST: Clear to auscultation bilaterally, No wheezes, rhonchi, or crackles CARDIOVASCULAR: Normal heart rate and rhythm, S1 and S2 normal without murmurs ABDOMEN: Soft, non-tender, non-distended, without organomegaly, Normal bowel sounds EXTREMITIES: No cyanosis or edema NEUROLOGICAL: Cranial nerves grossly intact, Moves all extremities without gross motor or sensory defici    Assessment & Plan:   Problem List Items Addressed This Visit       Other   Marijuana abuse   Other Visit Diagnoses       Cocaine abuse (HCC)    -  Primary     1. Pain, dental ***  2. Screen for sexually transmitted diseases *** - Cervicovaginal ancillary only - HIV antibody (with reflex) - RPR  3. Cocaine abuse (HCC) (Primary) ***  4. Marijuana abuse ***  5. Alcohol abuse *** - thiamine (VITAMIN B-1) 100 MG tablet; Take 1 tablet (100 mg total) by mouth daily.  Dispense: 30 tablet; Refill: 1 - Multiple Vitamin (MULTIVITAMIN WITH MINERALS) TABS tablet; Take 1 tablet by mouth daily.  Dispense: 30 tablet; Refill: 1    Assessment and Plan Dental Pain Persistent dental pain despite starting amoxicillin and ibuprofen. Plans to seek definitive dental treatment. - Continue amoxicillin and ibuprofen as prescribed. - Encourage follow-up with a dentist for  evaluation and possible extraction.  Vaginal Discharge White discharge with odor. Differential includes bacterial vaginosis, yeast infection, or STIs. Awaiting test results before treatment. - Perform vaginal swab for gonorrhea, chlamydia, trichomonas, bacterial vaginosis, and yeast infection. - Perform blood tests for HIV and syphilis. - Await test results before initiating treatment.  Depression and Anxiety Stable on current medications. Psychiatry to manage refills. - Coordinate with psychiatry for refills of mental health medications.  General Health Maintenance Requests STD screening due to past exposure and recent partners. - Perform STD screening as requested.    No follow-ups on file.   Kasandra Knudsen Mayers, PA-C

## 2023-12-11 LAB — CERVICOVAGINAL ANCILLARY ONLY
Bacterial Vaginitis (gardnerella): POSITIVE — AB
Candida Glabrata: NEGATIVE
Candida Vaginitis: NEGATIVE
Chlamydia: NEGATIVE
Comment: NEGATIVE
Comment: NEGATIVE
Comment: NEGATIVE
Comment: NEGATIVE
Comment: NEGATIVE
Comment: NORMAL
Neisseria Gonorrhea: NEGATIVE
Trichomonas: POSITIVE — AB

## 2023-12-11 LAB — RPR: RPR Ser Ql: NONREACTIVE

## 2023-12-11 LAB — HIV ANTIBODY (ROUTINE TESTING W REFLEX): HIV Screen 4th Generation wRfx: NONREACTIVE

## 2023-12-11 MED ORDER — METRONIDAZOLE 500 MG PO TABS: 500.0000 mg | ORAL_TABLET | Freq: Two times a day (BID) | ORAL | 0 refills | Status: AC

## 2023-12-11 NOTE — Addendum Note (Signed)
 Addended by: Roney Jaffe on: 12/11/2023 02:43 PM   Modules accepted: Orders

## 2023-12-18 ENCOUNTER — Encounter: Payer: Self-pay | Admitting: General Practice

## 2024-01-29 ENCOUNTER — Ambulatory Visit: Payer: Self-pay

## 2024-01-29 NOTE — Telephone Encounter (Signed)
 Chief Complaint: vaginal bleeding Symptoms: bleeding, abd cramps Frequency: x 5 months, occasional vaginal discharge, occasional lightheadedness Pertinent Negatives: Patient denies fever, falls/injuries Disposition: [] ED /[] Urgent Care (no appt availability in office) / [x] Appointment(In office/virtual)/ []  [facility]/ [] Home Care/ [] Refused Recommended Disposition /[] [facility] [facility]/ []  Follow-up with PCP Additional Notes: Pt calling to establish herself at [facility], but was transferred to triage d.t c/o vaginal bleeding/abd cramping. Pt reports has Implanon  x 9 years and has noticed irregular bleeding x 5 months. Pt denies any injuries/falls, fever, and is not on blood thinners. Pt states that she needs to est PCP to address Implanon . Of note, pt does not have est GYN either. Scheduled patient at earliest New Pt appt based off pt location on 02/26/2024. Triager also advised UC for any acute/worsening sx until she is able to be established at PCP office. Patient verbalized understanding.        Copied from CRM [id number]. Topic: Clinical - Red Word Triage >> Jan 29, 2024  2:03 PM Chuck Crater wrote: Red Word that prompted transfer to Nurse Triage: Patient has body pains and constant bleeding. She thinks that this is the side effect of having the birth control in her arm for 9 years.  PFM Reason for Disposition  [1] Bleeding or spotting between regular periods AND [2] occurs more than three cycles (3 months) AND [3] using birth control medicine (pills, patch, Depo-Provera, Implanon , vaginal ring, Mirena IUD)  Answer Assessment - Initial Assessment Questions 1. AMOUNT: "Describe the bleeding that you are having."    - SPOTTING: spotting, or pinkish / brownish mucous discharge; does not fill panty liner or pad    - MILD:  less than 1 pad / hour; less than patient's usual menstrual bleeding   - MODERATE: 1-2 pads / hour; 1 menstrual cup every 6 hours; small-medium blood clots  (e.g., pea, grape, small coin)   - SEVERE: soaking 2 or more pads/hour for 2 or more hours; 1 menstrual cup every 2 hours; bleeding not contained by pads or continuous red blood from vagina; large blood clots (e.g., golf ball, large coin)      Every 4-6 hours 2. ONSET: "When did the bleeding begin?" "Is it continuing now?"     X 5 mos 3. MENSTRUAL PERIOD: "When was the last normal menstrual period?" "How is this different than your period?"     Pt reports had arm implant BCP for about 9 years and now having in irregular menstruation/abd cramping 4. REGULARITY: "How regular are your periods?"     Reports bleeding 3/4 weeks 5. ABDOMEN PAIN: "Do you have any pain?" "How bad is the pain?"  (e.g., Scale 1-10; mild, moderate, or severe)   - MILD (1-3): doesn't interfere with normal activities, abdomen soft and not tender to touch    - MODERATE (4-7): interferes with normal activities or awakens from sleep, abdomen tender to touch    - SEVERE (8-10): excruciating pain, doubled over, unable to do any normal activities     10/10 abd cramping 6. PREGNANCY: "Is there any chance you are pregnant?" "When was your last menstrual period?"     denies 7. BREASTFEEDING: "Are you breastfeeding?"     denies 8. HORMONE MEDICINES: "Are you taking any hormone medicines, prescription or over-the-counter?" (e.g., birth control pills, estrogen)     denies 9. BLOOD THINNER MEDICINES: "Do you take any blood thinners?" (e.g., Coumadin / warfarin, Pradaxa / dabigatran, aspirin)     denies 10. CAUSE: "What  do you think is causing the bleeding?" (e.g., recent gyn surgery, recent gyn procedure; known bleeding disorder, cervical cancer, polycystic ovarian disease, fibroids)         Pt endorses needing implanon  removed/reassessed 11. HEMODYNAMIC STATUS: "Are you weak or feeling lightheaded?" If Yes, ask: "Can you stand and walk normally?"        Yes, reports able to stand/walk 12. OTHER SYMPTOMS: "What other symptoms are you  having with the bleeding?" (e.g., passed tissue, vaginal discharge, fever, menstrual-type cramps)       Abd cramps, light intermittent discharge  Protocols used: Vaginal Bleeding - Abnormal-A-AH

## 2024-02-26 ENCOUNTER — Ambulatory Visit: Admitting: Family Medicine

## 2024-03-05 ENCOUNTER — Ambulatory Visit: Admitting: Family Medicine

## 2024-03-05 DIAGNOSIS — F1010 Alcohol abuse, uncomplicated: Secondary | ICD-10-CM

## 2024-03-05 DIAGNOSIS — Z0000 Encounter for general adult medical examination without abnormal findings: Secondary | ICD-10-CM

## 2024-03-05 DIAGNOSIS — F1994 Other psychoactive substance use, unspecified with psychoactive substance-induced mood disorder: Secondary | ICD-10-CM

## 2024-03-05 DIAGNOSIS — F1410 Cocaine abuse, uncomplicated: Secondary | ICD-10-CM

## 2024-03-05 DIAGNOSIS — Z1159 Encounter for screening for other viral diseases: Secondary | ICD-10-CM

## 2024-05-10 ENCOUNTER — Other Ambulatory Visit (HOSPITAL_COMMUNITY)
Admission: EM | Admit: 2024-05-10 | Discharge: 2024-05-13 | Disposition: A | Discharge status: Home / Self Care | Payer: MEDICAID | Source: Intra-hospital | Attending: Psychiatry | Admitting: Psychiatry

## 2024-05-10 ENCOUNTER — Other Ambulatory Visit (HOSPITAL_COMMUNITY)
Admission: EM | Admit: 2024-05-10 | Discharge: 2024-05-10 | Disposition: A | Discharge status: Rehabilitation Facility | Payer: MEDICAID

## 2024-05-10 DIAGNOSIS — G43909 Migraine, unspecified, not intractable, without status migrainosus: Secondary | ICD-10-CM | POA: Diagnosis present

## 2024-05-10 DIAGNOSIS — Z79899 Other long term (current) drug therapy: Secondary | ICD-10-CM | POA: Insufficient documentation

## 2024-05-10 DIAGNOSIS — F4310 Post-traumatic stress disorder, unspecified: Secondary | ICD-10-CM | POA: Insufficient documentation

## 2024-05-10 DIAGNOSIS — F1994 Other psychoactive substance use, unspecified with psychoactive substance-induced mood disorder: Secondary | ICD-10-CM | POA: Diagnosis present

## 2024-05-10 DIAGNOSIS — F1410 Cocaine abuse, uncomplicated: Secondary | ICD-10-CM | POA: Diagnosis present

## 2024-05-10 DIAGNOSIS — F1020 Alcohol dependence, uncomplicated: Secondary | ICD-10-CM

## 2024-05-10 DIAGNOSIS — F1910 Other psychoactive substance abuse, uncomplicated: Secondary | ICD-10-CM

## 2024-05-10 DIAGNOSIS — F1210 Cannabis abuse, uncomplicated: Secondary | ICD-10-CM | POA: Diagnosis present

## 2024-05-10 DIAGNOSIS — Z5900 Homelessness unspecified: Secondary | ICD-10-CM | POA: Insufficient documentation

## 2024-05-10 DIAGNOSIS — F32A Depression, unspecified: Secondary | ICD-10-CM | POA: Insufficient documentation

## 2024-05-10 DIAGNOSIS — F1094 Alcohol use, unspecified with alcohol-induced mood disorder: Secondary | ICD-10-CM | POA: Diagnosis present

## 2024-05-10 DIAGNOSIS — F1010 Alcohol abuse, uncomplicated: Secondary | ICD-10-CM

## 2024-05-10 DIAGNOSIS — F329 Major depressive disorder, single episode, unspecified: Secondary | ICD-10-CM | POA: Insufficient documentation

## 2024-05-10 DIAGNOSIS — G4700 Insomnia, unspecified: Secondary | ICD-10-CM | POA: Insufficient documentation

## 2024-05-10 LAB — COMPREHENSIVE METABOLIC PANEL WITH GFR
ALT: 31 U/L (ref 0–44)
AST: 116 U/L — ABNORMAL HIGH (ref 15–41)
Albumin: 4.6 g/dL (ref 3.5–5.0)
Alkaline Phosphatase: 54 U/L (ref 38–126)
Anion gap: 16 — ABNORMAL HIGH (ref 5–15)
BUN: 10 mg/dL (ref 6–20)
CO2: 22 mmol/L (ref 22–32)
Calcium: 9.7 mg/dL (ref 8.9–10.3)
Chloride: 97 mmol/L — ABNORMAL LOW (ref 98–111)
Creatinine, Ser: 0.93 mg/dL (ref 0.44–1.00)
GFR, Estimated: >60 mL/min (ref >60)
Glucose, Bld: 75 mg/dL (ref 70–99)
Potassium: 4.2 mmol/L (ref 3.5–5.1)
Sodium: 135 mmol/L (ref 135–145)
Total Bilirubin: 1.2 mg/dL (ref 0.0–1.2)
Total Protein: 8.4 g/dL — ABNORMAL HIGH (ref 6.5–8.1)

## 2024-05-10 LAB — POCT URINE DRUG SCREEN - MANUAL ENTRY (I-SCREEN)
POC Amphetamine UR: NOT DETECTED
POC Buprenorphine (BUP): NOT DETECTED
POC Cocaine UR: POSITIVE — AB
POC Marijuana UR: POSITIVE — AB
POC Methadone UR: NOT DETECTED
POC Methamphetamine UR: NOT DETECTED
POC Morphine: NOT DETECTED
POC Oxazepam (BZO): NOT DETECTED
POC Oxycodone UR: NOT DETECTED
POC Secobarbital (BAR): NOT DETECTED

## 2024-05-10 LAB — CBC WITH DIFFERENTIAL/PLATELET
Abs Immature Granulocytes: 0.04 K/uL (ref 0.00–0.07)
Basophils Absolute: 0.1 K/uL (ref 0.0–0.1)
Basophils Relative: 1 %
Eosinophils Absolute: 0 K/uL (ref 0.0–0.5)
Eosinophils Relative: 0 %
HCT: 39.3 % (ref 36.0–46.0)
Hemoglobin: 13.6 g/dL (ref 12.0–15.0)
Immature Granulocytes: 0 %
Lymphocytes Relative: 14 %
Lymphs Abs: 1.9 K/uL (ref 0.7–4.0)
MCH: 34 pg (ref 26.0–34.0)
MCHC: 34.6 g/dL (ref 30.0–36.0)
MCV: 98.3 fL (ref 80.0–100.0)
Monocytes Absolute: 0.5 K/uL (ref 0.1–1.0)
Monocytes Relative: 3 %
Neutro Abs: 10.9 K/uL — ABNORMAL HIGH (ref 1.7–7.7)
Neutrophils Relative %: 82 %
Platelets: 391 K/uL (ref 150–400)
RBC: 4 MIL/uL (ref 3.87–5.11)
RDW: 12.5 % (ref 11.5–15.5)
WBC: 13.4 K/uL — ABNORMAL HIGH (ref 4.0–10.5)
nRBC: 0 % (ref 0.0–0.2)

## 2024-05-10 LAB — TSH: TSH: 0.992 u[IU]/mL (ref 0.350–4.500)

## 2024-05-10 LAB — HEMOGLOBIN A1C
Hgb A1c MFr Bld: 4.8 % (ref 4.8–5.6)
Mean Plasma Glucose: 91.06 mg/dL

## 2024-05-10 LAB — POC URINE PREG, ED: Preg Test, Ur: NEGATIVE

## 2024-05-10 LAB — ETHANOL: Alcohol, Ethyl (B): <15 mg/dL (ref <15)

## 2024-05-10 MED ORDER — DIPHENHYDRAMINE HCL 50 MG/ML IJ SOLN: 50.0000 mg | Freq: Three times a day (TID) | INTRAMUSCULAR | Status: DC | PRN

## 2024-05-10 MED ORDER — ONDANSETRON 4 MG PO TBDP: 4.0000 mg | ORAL_TABLET | Freq: Four times a day (QID) | ORAL | Status: DC | PRN

## 2024-05-10 MED ORDER — ACETAMINOPHEN 325 MG PO TABS: 650.0000 mg | ORAL_TABLET | Freq: Four times a day (QID) | ORAL | Status: DC | PRN

## 2024-05-10 MED ORDER — DIPHENHYDRAMINE HCL 50 MG/ML IJ SOLN
50.0000 mg | Freq: Three times a day (TID) | INTRAMUSCULAR | Status: DC | PRN
Start: 2024-05-10 — End: 2024-05-10

## 2024-05-10 MED ORDER — ALUM & MAG HYDROXIDE-SIMETH 200-200-20 MG/5ML PO SUSP: 30.0000 mL | ORAL | Status: DC | PRN

## 2024-05-10 MED ORDER — LORAZEPAM 2 MG/ML IJ SOLN: 2.0000 mg | Freq: Three times a day (TID) | INTRAMUSCULAR | Status: DC | PRN

## 2024-05-10 MED ORDER — HYDROXYZINE HCL 25 MG PO TABS: 25.0000 mg | ORAL_TABLET | Freq: Four times a day (QID) | ORAL | Status: DC | PRN

## 2024-05-10 MED ORDER — HALOPERIDOL 5 MG PO TABS: 5.0000 mg | ORAL_TABLET | Freq: Three times a day (TID) | ORAL | Status: DC | PRN

## 2024-05-10 MED ORDER — HALOPERIDOL LACTATE 5 MG/ML IJ SOLN: 10.0000 mg | Freq: Three times a day (TID) | INTRAMUSCULAR | Status: DC | PRN

## 2024-05-10 MED ORDER — LORAZEPAM 1 MG PO TABS: 1.0000 mg | ORAL_TABLET | Freq: Four times a day (QID) | ORAL | Status: DC | PRN

## 2024-05-10 MED ORDER — MAGNESIUM HYDROXIDE 400 MG/5ML PO SUSP: 30.0000 mL | Freq: Every day | ORAL | Status: DC | PRN

## 2024-05-10 MED ORDER — HALOPERIDOL LACTATE 5 MG/ML IJ SOLN: 5.0000 mg | Freq: Three times a day (TID) | INTRAMUSCULAR | Status: DC | PRN

## 2024-05-10 MED ORDER — ADULT MULTIVITAMIN W/MINERALS CH: 1.0000 | ORAL_TABLET | Freq: Every day | ORAL | Status: DC

## 2024-05-10 MED ORDER — LORAZEPAM 2 MG/ML IJ SOLN
2.0000 mg | Freq: Three times a day (TID) | INTRAMUSCULAR | Status: DC | PRN
Start: 2024-05-10 — End: 2024-05-10

## 2024-05-10 MED ORDER — THIAMINE MONONITRATE 100 MG PO TABS: 100.0000 mg | ORAL_TABLET | Freq: Every day | ORAL | Status: DC

## 2024-05-10 MED ORDER — LOPERAMIDE HCL 2 MG PO CAPS: 2.0000 mg | ORAL_CAPSULE | ORAL | Status: DC | PRN

## 2024-05-10 MED ORDER — TRAZODONE HCL 50 MG PO TABS
50.0000 mg | ORAL_TABLET | Freq: Every evening | ORAL | Status: DC | PRN
  Administered 2024-05-11 – 2024-05-12 (×2): 50 mg via ORAL
  Filled 2024-05-10 (×2): qty 1

## 2024-05-10 MED ORDER — TRAZODONE HCL 50 MG PO TABS: 50.0000 mg | ORAL_TABLET | Freq: Every evening | ORAL | Status: DC | PRN

## 2024-05-10 MED ORDER — DIPHENHYDRAMINE HCL 50 MG PO CAPS: 50.0000 mg | ORAL_CAPSULE | Freq: Three times a day (TID) | ORAL | Status: DC | PRN

## 2024-05-10 MED ORDER — THIAMINE HCL 100 MG/ML IJ SOLN: 100.0000 mg | Freq: Once | INTRAMUSCULAR | Status: DC

## 2024-05-10 MED ORDER — ACETAMINOPHEN 325 MG PO TABS
650.0000 mg | ORAL_TABLET | Freq: Four times a day (QID) | ORAL | Status: DC | PRN
  Administered 2024-05-11 (×2): 650 mg via ORAL
  Filled 2024-05-10 (×2): qty 2

## 2024-05-10 NOTE — ED Notes (Signed)
 Patient is in the bedroom calm and sleeping. NAD Will monitor for safety.
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
## 2024-05-10 NOTE — Progress Notes (Signed)
   05/10/24 1929  [facility] Triage Screening (Walk-ins at [facility] only)  How Did You Hear About Us ? Self  What Is the Reason for Your Visit/Call Today? Ms. Bumpus arrived to [facility] alone seeking detox services for alcohol, cocaine and mariguana use. She states she had a bottle of alcohol last night and smoked a blunt and $20 dollars worth of cocaine this morning. PT states [facility] and was [facility] in april and stopped taking her medication in may (trazodone  and setraline). PT states no SI, HI, AVH. She endorses substance use within the past 24 hours.  How Long Has This Been Causing You Problems? <Week  Have You Recently Had Any Thoughts About Hurting Yourself? No  Are You Planning to Commit Suicide/Harm Yourself At This time? No  Have you Recently Had Thoughts About Hurting Someone Sherral? No  Are You Planning To Harm Someone At This Time? No  Physical Abuse Denies  Verbal Abuse Denies  Sexual Abuse Denies  Exploitation of patient/patient's resources Denies  Self-Neglect Denies  Are you currently experiencing any auditory, visual or other hallucinations? No  Have You Used Any Alcohol or Drugs in the Past 24 Hours? Yes  What Did You Use and How Much? Alcohol a whole bottle, Cocaine/marajuana about $20 worth.  Do you have any current medical co-morbidities that require immediate attention? No  Clinician description of patient physical appearance/behavior: Pt is shaking, dehydrated, fatigued.  What Do You Feel Would Help You the Most Today? Alcohol or Drug Use Treatment;Tobacco/Nicotine  Dependence;Medication(s)  Determination of Need Urgent (48 hours)  Options For Referral [facility];Facility-Based Crisis;Other: Comment;Chemical Dependency Intensive Outpatient Therapy (CDIOP)  Determination of Need filed? Yes

## 2024-05-10 NOTE — ED Notes (Signed)
 Patient admitted into [facility] for alcohol detox. On arrival, pt A/OX4 MAE. Denies SI/HI/AVH. Cooperated well throughout the admission process. Oriented her to the unit and unit rules.Skin assessment done and WNL. Environment secured and patient accompanied to her room.  Will monitor for safety

## 2024-05-10 NOTE — Group Note (Signed)
 Group Topic: Recovery Basics  Group Date: 05/10/2024 Start Time: 1930 End Time: 2000 Facilitators: Verdon Jacqualyn BRAVO, NT  Department: [facility]  Number of Participants: 7  Group Focus: coping skills Treatment Modality:  Individual Therapy Interventions utilized were group exercise Purpose: express feelings  Name: Carla Rollins Date of Birth: 23-Aug-1996  MR: [id number]    Level of Participation: did not participate, was not on unit yet during group Quality of Participation: n/a Interactions with others: n/a Mood/Affect: appropriate Triggers (if applicable): n/a Cognition: coherent/clear Progress: Moderate Response: n/a Plan: follow-up needed  Patients Problems:  Patient Active Problem List   Diagnosis Date Noted   

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
## 2024-05-11 DIAGNOSIS — F1094 Alcohol use, unspecified with alcohol-induced mood disorder: Secondary | ICD-10-CM | POA: Diagnosis not present

## 2024-05-11 DIAGNOSIS — F1994 Other psychoactive substance use, unspecified with psychoactive substance-induced mood disorder: Secondary | ICD-10-CM | POA: Diagnosis not present

## 2024-05-11 DIAGNOSIS — G43909 Migraine, unspecified, not intractable, without status migrainosus: Secondary | ICD-10-CM | POA: Diagnosis present

## 2024-05-11 DIAGNOSIS — F1210 Cannabis abuse, uncomplicated: Secondary | ICD-10-CM | POA: Diagnosis not present

## 2024-05-11 DIAGNOSIS — F1410 Cocaine abuse, uncomplicated: Secondary | ICD-10-CM | POA: Diagnosis not present

## 2024-05-11 LAB — URINALYSIS, ROUTINE W REFLEX MICROSCOPIC
Bacteria, UA: NONE SEEN
Bilirubin Urine: NEGATIVE
Glucose, UA: NEGATIVE mg/dL
Ketones, ur: 20 mg/dL — AB
Leukocytes,Ua: NEGATIVE
Nitrite: NEGATIVE
Protein, ur: 30 mg/dL — AB
Specific Gravity, Urine: 1.029 (ref 1.005–1.030)
pH: 5 (ref 5.0–8.0)

## 2024-05-11 MED ORDER — NICOTINE POLACRILEX 2 MG MT GUM: 2.0000 mg | CHEWING_GUM | OROMUCOSAL | Status: DC | PRN

## 2024-05-11 MED ORDER — TOPIRAMATE 25 MG PO TABS
25.0000 mg | ORAL_TABLET | Freq: Two times a day (BID) | ORAL | Status: DC
  Administered 2024-05-11 – 2024-05-13 (×5): 25 mg via ORAL
  Filled 2024-05-11 (×5): qty 1

## 2024-05-11 MED ORDER — MELATONIN 5 MG PO TABS
5.0000 mg | ORAL_TABLET | Freq: Every day | ORAL | Status: DC
  Administered 2024-05-11 – 2024-05-12 (×2): 5 mg via ORAL
  Filled 2024-05-11 (×2): qty 1

## 2024-05-11 NOTE — ED Provider Notes (Signed)
 Facility Based Crisis Admission H&P  Date: 05/11/24 Patient Name: Carla Rollins MRN: [id number] Chief Complaint: to get clean  Diagnoses:  Final diagnoses:  Polysubstance abuse (HCC)    HPI: 28 YO F with a history of cocaine, alcohol and cannabis abuse who presented with depressed mood seeking detox.   On interview, the patient is cooperative. She feels that she has been depressed. She is not sleeping very well and her appetite has been sporatic. Her weight is up and down. She denies hallucinations, paranoia and delusions. She has occasional thoughts of death and dying but not today. She is currently using cocaine and alcohol daily. She is struggling with permanent housing and has no current income.   PHQ 2-9:  Flowsheet Row ED from 11/13/2023 in [facility] Counselor from 11/12/2023 in [facility]  Thoughts that you would be better off dead, or of hurting yourself in some way Not at all Not at all  PHQ-9 Total Score 6 18    Flowsheet Row ED from 05/10/2024 in [facility] Most recent reading at 05/10/2024 10:07 PM ED from 05/10/2024 in [facility] Most recent reading at 05/10/2024  7:36 PM ED from 12/08/2023 in [facility] Emergency Department at [facility] Most recent reading at 12/08/2023  7:18 PM  C-SSRS RISK CATEGORY No Risk No Risk No Risk      Total Time spent with patient: 30 minutes  Musculoskeletal  Strength & Muscle Tone: within normal limits Gait & Station: normal Patient leans: N/A  Psychiatric Specialty Exam  Presentation General Appearance:  Appropriate for Environment  Eye Contact: Good  Speech: Clear and Coherent  Speech Volume: Normal  Handedness: Right   Mood and Affect  Mood: Depressed  Affect: Appropriate   Thought Process  Thought Processes: Coherent  Descriptions of Associations:Intact  Orientation:Full (Time, Place  and Person)  Thought Content:WDL  Diagnosis of Schizophrenia or Schizoaffective disorder in past: No   Hallucinations:Hallucinations: None  Ideas of Reference:None  Suicidal Thoughts:Suicidal Thoughts: No  Homicidal Thoughts:Homicidal Thoughts: No   Sensorium  Memory: Immediate Good; Recent Good; Remote Good  Judgment: Poor  Insight: Good   Executive Functions  Concentration: Good  Attention Span: Good  Recall: Good  Fund of Knowledge: Good  Language: Good   Psychomotor Activity  Psychomotor Activity: Psychomotor Activity: Normal   Assets  Assets: Communication Skills; Desire for Improvement; Physical Health   Sleep  Sleep: Sleep: Poor Number of Hours of Sleep: 4   Nutritional Assessment (For OBS and FBC admissions only) Has the patient had a weight loss or gain of 10 pounds or more in the last 3 months?: No Has the patient had a decrease in food intake/or appetite?: Yes Does the patient have dental problems?: No Does the patient have eating habits or behaviors that may be indicators of an eating disorder including binging or inducing vomiting?: No Has the patient recently lost weight without trying?: 0 Has the patient been eating poorly because of a decreased appetite?: 0 Malnutrition Screening Tool Score: 0    Physical Exam Vitals and nursing note reviewed.  Constitutional:      Appearance: Normal appearance.  HENT:     Head: Normocephalic and atraumatic.  Eyes:     Extraocular Movements: Extraocular movements intact.  Pulmonary:     Effort: Pulmonary effort is normal.  Musculoskeletal:        General: Normal range of motion.     Cervical  back: Normal range of motion.  Neurological:     General: No focal deficit present.     Mental Status: She is alert and oriented to person, place, and time.  Psychiatric:        Behavior: Behavior normal.    Review of Systems  Constitutional:  Positive for diaphoresis. Negative for chills  and fever.  Respiratory:  Negative for cough.   Cardiovascular:  Negative for chest pain.  Gastrointestinal:  Negative for constipation, diarrhea, nausea and vomiting.  Genitourinary:  Negative for dysuria.  Musculoskeletal:  Positive for joint pain.  Neurological:  Positive for tremors and headaches.  Psychiatric/Behavioral:  Negative for hallucinations and suicidal ideas.     Blood pressure (!) 142/76, pulse 86, temperature 98.3 F (36.8 C), temperature source Oral, resp. rate 18, SpO2 91%, unknown if currently breastfeeding. There is no height or weight on file to calculate BMI.  Past Psychiatric History:  last at [facility] in March, instructed to follow up with [facility] residential and CDIOP. Previous medications: naltrexone , zoloft , trazodone ,   Substance history: started alcohol use first, later started cocaine. Has history of withdrawal symptoms.   Is the patient at risk to self? No  Has the patient been a risk to self in the past 6 months? Yes .    Has the patient been a risk to self within the distant past? Yes   Is the patient a risk to others? No   Has the patient been a risk to others in the past 6 months? No   Has the patient been a risk to others within the distant past? No   Past Medical History: migraines, eczema Family History: denies Social History:  [place], educated to the 11th grade. One child. Previous criminal charges.   Last Labs:  Admission on 05/10/2024  Component Date Value Ref Range Status   Color, Urine 05/11/2024 AMBER (A)  YELLOW Final   BIOCHEMICALS MAY BE AFFECTED BY COLOR   APPearance 05/11/2024 HAZY (A)  CLEAR Final   Specific Gravity, Urine 05/11/2024 1.029  1.005 - 1.030 Final   pH 05/11/2024 5.0  5.0 - 8.0 Final   Glucose, UA 05/11/2024 NEGATIVE  NEGATIVE mg/dL Final   Hgb urine dipstick 05/11/2024 LARGE (A)  NEGATIVE Final   Bilirubin Urine 05/11/2024 NEGATIVE  NEGATIVE Final   Ketones, ur 05/11/2024 20 (A)  NEGATIVE mg/dL Final    Protein, ur 90/92/7974 30 (A)  NEGATIVE mg/dL Final   Nitrite 90/92/7974 NEGATIVE  NEGATIVE Final   Leukocytes,Ua 05/11/2024 NEGATIVE  NEGATIVE Final   RBC / HPF 05/11/2024 0-5  0 - 5 RBC/hpf Final   WBC, UA 05/11/2024 0-5  0 - 5 WBC/hpf Final   Bacteria, UA 05/11/2024 NONE SEEN  NONE SEEN Final   Squamous Epithelial / HPF 05/11/2024 0-5  0 - 5 /HPF Final   Mucus 05/11/2024 PRESENT   Final   Performed at [facility], [address], [place], [place] [address]  Admission on 05/10/2024, Discharged on 05/10/2024  Component Date Value Ref Range Status   WBC 05/10/2024 13.4 (H)  4.0 - 10.5 K/uL Final   RBC 05/10/2024 4.00  3.87 - 5.11 MIL/uL Final   Hemoglobin 05/10/2024 13.6  12.0 - 15.0 g/dL Final   HCT 90/93/7974 39.3  36.0 - 46.0 % Final   MCV 05/10/2024 98.3  80.0 - 100.0 fL Final   MCH 05/10/2024 34.0  26.0 - 34.0 pg Final   MCHC 05/10/2024 34.6  30.0 - 36.0  g/dL Final   RDW 90/93/7974 12.5  11.5 - 15.5 % Final   Platelets 05/10/2024 391  150 - 400 K/uL Final   nRBC 05/10/2024 0.0  0.0 - 0.2 % Final   Neutrophils Relative % 05/10/2024 82  % Final   Neutro Abs 05/10/2024 10.9 (H)  1.7 - 7.7 K/uL Final   Lymphocytes Relative 05/10/2024 14  % Final   Lymphs Abs 05/10/2024 1.9  0.7 - 4.0 K/uL Final   Monocytes Relative 05/10/2024 3  % Final   Monocytes Absolute 05/10/2024 0.5  0.1 - 1.0 K/uL Final   Eosinophils Relative 05/10/2024 0  % Final   Eosinophils Absolute 05/10/2024 0.0  0.0 - 0.5 K/uL Final   Basophils Relative 05/10/2024 1  % Final   Basophils Absolute 05/10/2024 0.1  0.0 - 0.1 K/uL Final   Immature Granulocytes 05/10/2024 0  % Final   Abs Immature Granulocytes 05/10/2024 0.04  0.00 - 0.07 K/uL Final   Performed at [facility], [address] [place], [place] [address]   Sodium 05/10/2024 135  135 - 145 mmol/L Final   Potassium 05/10/2024 4.2  3.5 - 5.1 mmol/L Final   Chloride 05/10/2024 97 (L)  98 - 111 mmol/L Final   CO2 05/10/2024 22  22 - 32 mmol/L  Final   Glucose, Bld 05/10/2024 75  70 - 99 mg/dL Final   Glucose reference range applies only to samples taken after fasting for at least 8 hours.   BUN 05/10/2024 10  6 - 20 mg/dL Final   Creatinine, Ser 05/10/2024 0.93  0.44 - 1.00 mg/dL Final   Calcium 90/93/7974 9.7  8.9 - 10.3 mg/dL Final   Total Protein 90/93/7974 8.4 (H)  6.5 - 8.1 g/dL Final   Albumin 90/93/7974 4.6  3.5 - 5.0 g/dL Final   AST 90/93/7974 116 (H)  15 - 41 U/L Final   ALT 05/10/2024 31  0 - 44 U/L Final   Alkaline Phosphatase 05/10/2024 54  38 - 126 U/L Final   Total Bilirubin 05/10/2024 1.2  0.0 - 1.2 mg/dL Final   GFR, Estimated 05/10/2024 >60  >60 mL/min Final   Comment: (NOTE) Calculated using the CKD-EPI Creatinine Equation (2021)    Anion gap 05/10/2024 16 (H)  5 - 15 Final   Performed at [facility], [address], [place], [place] [address]   Hgb A1c MFr Bld 05/10/2024 4.8  4.8 - 5.6 % Final   Comment: (NOTE) Diagnosis of Diabetes The following HbA1c ranges recommended by the American Diabetes Association (ADA) may be used as an aid in the diagnosis of diabetes mellitus.  Hemoglobin             Suggested A1C NGSP%              Diagnosis  <5.7                   Non Diabetic  5.7-6.4                Pre-Diabetic  >6.4                   Diabetic  <7.0                   Glycemic control for                       adults with diabetes.     Mean Plasma Glucose 05/10/2024  91.06  mg/dL Final   Performed at [facility], [address], [place], [place] [address]   Alcohol, Ethyl (B) 05/10/2024 <15  <15 mg/dL Final   Comment: (NOTE) For medical purposes only. Performed at [facility], [address], [place], [place] [address]    TSH 05/10/2024 0.992  0.350 - 4.500 uIU/mL Final   Comment: Performed by a 3rd Generation assay with a functional sensitivity of <=0.01 uIU/mL. Performed at [facility], [address], [place], [place] [address]    Preg Test, Ur 05/10/2024  Negative  Negative Final   POC Amphetamine UR 05/10/2024 None Detected  NONE DETECTED (Cut Off Level 1000 ng/mL) Final   POC Secobarbital (BAR) 05/10/2024 None Detected  NONE DETECTED (Cut Off Level 300 ng/mL) Final   POC Buprenorphine (BUP) 05/10/2024 None Detected  NONE DETECTED (Cut Off Level 10 ng/mL) Final   POC Oxazepam (BZO) 05/10/2024 None Detected  NONE DETECTED (Cut Off Level 300 ng/mL) Final   POC Cocaine UR 05/10/2024 Positive (A)  NONE DETECTED (Cut Off Level 300 ng/mL) Final   POC Methamphetamine UR 05/10/2024 None Detected  NONE DETECTED (Cut Off Level 1000 ng/mL) Final   POC Morphine  05/10/2024 None Detected  NONE DETECTED (Cut Off Level 300 ng/mL) Final   POC Methadone UR 05/10/2024 None Detected  NONE DETECTED (Cut Off Level 300 ng/mL) Final   POC Oxycodone  UR 05/10/2024 None Detected  NONE DETECTED (Cut Off Level 100 ng/mL) Final   POC Marijuana UR 05/10/2024 Positive (A)  NONE DETECTED (Cut Off Level 50 ng/mL) Final  Office Visit on 12/10/2023  Component Date Value Ref Range Status   Neisseria Gonorrhea 12/10/2023 Negative   Final   Chlamydia 12/10/2023 Negative   Final   Trichomonas 12/10/2023 Positive (A)   Final   Bacterial Vaginitis (gardnerella) 12/10/2023 Positive (A)   Final   Candida Vaginitis 12/10/2023 Negative   Final   Candida Glabrata 12/10/2023 Negative   Final   Comment 12/10/2023 Normal Reference Range Bacterial Vaginosis - Negative   Final   Comment 12/10/2023 Normal Reference Range Candida Species - Negative   Final   Comment 12/10/2023 Normal Reference Range Candida Galbrata - Negative   Final   Comment 12/10/2023 Normal Reference Range Trichomonas - Negative   Final   Comment 12/10/2023 Normal Reference Ranger Chlamydia - Negative   Final   Comment 12/10/2023 Normal Reference Range Neisseria Gonorrhea - Negative   Final   HIV Screen 4th Generation wRfx 12/10/2023 Non Reactive  Non Reactive Final   Comment: HIV-1/HIV-2 antibodies and HIV-1 p24 antigen  were NOT detected. There is no laboratory evidence of HIV infection. HIV Negative    RPR Ser Ql 12/10/2023 Non Reactive  Non Reactive Final  Admission on 11/12/2023, Discharged on 11/13/2023  Component Date Value Ref Range Status   WBC 11/12/2023 8.9  4.0 - 10.5 K/uL Final   RBC 11/12/2023 4.28  3.87 - 5.11 MIL/uL Final   Hemoglobin 11/12/2023 14.4  12.0 - 15.0 g/dL Final   HCT 96/89/7974 41.6  36.0 - 46.0 % Final   MCV 11/12/2023 97.2  80.0 - 100.0 fL Final   MCH 11/12/2023 33.6  26.0 - 34.0 pg Final   MCHC 11/12/2023 34.6  30.0 - 36.0 g/dL Final   RDW 96/89/7974 12.1  11.5 - 15.5 % Final   Platelets 11/12/2023 364  150 - 400 K/uL Final   nRBC 11/12/2023 0.0  0.0 - 0.2 % Final   Neutrophils Relative %  11/12/2023 44  % Final   Neutro Abs 11/12/2023 3.9  1.7 - 7.7 K/uL Final   Lymphocytes Relative 11/12/2023 45  % Final   Lymphs Abs 11/12/2023 3.9  0.7 - 4.0 K/uL Final   Monocytes Relative 11/12/2023 5  % Final   Monocytes Absolute 11/12/2023 0.5  0.1 - 1.0 K/uL Final   Eosinophils Relative 11/12/2023 5  % Final   Eosinophils Absolute 11/12/2023 0.5  0.0 - 0.5 K/uL Final   Basophils Relative 11/12/2023 1  % Final   Basophils Absolute 11/12/2023 0.1  0.0 - 0.1 K/uL Final   Immature Granulocytes 11/12/2023 0  % Final   Abs Immature Granulocytes 11/12/2023 0.02  0.00 - 0.07 K/uL Final   Performed at [facility], [address] [place], [place] [address]   Sodium 11/12/2023 139  135 - 145 mmol/L Final   Potassium 11/12/2023 4.3  3.5 - 5.1 mmol/L Final   Chloride 11/12/2023 105  98 - 111 mmol/L Final   CO2 11/12/2023 25  22 - 32 mmol/L Final   Glucose, Bld 11/12/2023 80  70 - 99 mg/dL Final   Glucose reference range applies only to samples taken after fasting for at least 8 hours.   BUN 11/12/2023 7  6 - 20 mg/dL Final   Creatinine, Ser 11/12/2023 0.76  0.44 - 1.00 mg/dL Final   Calcium 96/89/7974 9.7  8.9 - 10.3 mg/dL Final   Total Protein 96/89/7974 7.5  6.5 - 8.1 g/dL  Final   Albumin 96/89/7974 4.1  3.5 - 5.0 g/dL Final   AST 96/89/7974 41  15 - 41 U/L Final   ALT 11/12/2023 27  0 - 44 U/L Final   Alkaline Phosphatase 11/12/2023 54  38 - 126 U/L Final   Total Bilirubin 11/12/2023 0.6  0.0 - 1.2 mg/dL Final   GFR, Estimated 11/12/2023 >60  >60 mL/min Final   Comment: (NOTE) Calculated using the CKD-EPI Creatinine Equation (2021)    Anion gap 11/12/2023 9  5 - 15 Final   Performed at [facility], [address] [place], [place] [address]   Alcohol, Ethyl (B) 11/12/2023 <10  <10 mg/dL Final   Comment: (NOTE) Lowest detectable limit for serum alcohol is 10 mg/dL.  For medical purposes only. Performed at [facility], [address], [place], [place] [address]    Preg Test, Ur 11/12/2023 Negative  Negative Final   POC Amphetamine UR 11/12/2023 None Detected  NONE DETECTED (Cut Off Level 1000 ng/mL) Final   POC Secobarbital (BAR) 11/12/2023 None Detected  NONE DETECTED (Cut Off Level 300 ng/mL) Final   POC Buprenorphine (BUP) 11/12/2023 None Detected  NONE DETECTED (Cut Off Level 10 ng/mL) Final   POC Oxazepam (BZO) 11/12/2023 None Detected  NONE DETECTED (Cut Off Level 300 ng/mL) Final   POC Cocaine UR 11/12/2023 Positive (A)  NONE DETECTED (Cut Off Level 300 ng/mL) Final   POC Methamphetamine UR 11/12/2023 None Detected  NONE DETECTED (Cut Off Level 1000 ng/mL) Final   POC Morphine  11/12/2023 None Detected  NONE DETECTED (Cut Off Level 300 ng/mL) Final   POC Methadone UR 11/12/2023 None Detected  NONE DETECTED (Cut Off Level 300 ng/mL) Final   POC Oxycodone  UR 11/12/2023 None Detected  NONE DETECTED (Cut Off Level 100 ng/mL) Final   POC Marijuana UR 11/12/2023 Positive (A)  NONE DETECTED (Cut Off Level 50 ng/mL) Final   Color, Urine 11/12/2023 AMBER (A)  YELLOW Final   BIOCHEMICALS MAY BE  AFFECTED BY COLOR   APPearance 11/12/2023 HAZY (A)  CLEAR Final   Specific Gravity, Urine 11/12/2023 1.027  1.005 - 1.030 Final   pH 11/12/2023 5.0   5.0 - 8.0 Final   Glucose, UA 11/12/2023 NEGATIVE  NEGATIVE mg/dL Final   Hgb urine dipstick 11/12/2023 LARGE (A)  NEGATIVE Final   Bilirubin Urine 11/12/2023 NEGATIVE  NEGATIVE Final   Ketones, ur 11/12/2023 NEGATIVE  NEGATIVE mg/dL Final   Protein, ur 96/89/7974 NEGATIVE  NEGATIVE mg/dL Final   Nitrite 96/89/7974 POSITIVE (A)  NEGATIVE Final   Leukocytes,Ua 11/12/2023 NEGATIVE  NEGATIVE Final   RBC / HPF 11/12/2023 6-10  0 - 5 RBC/hpf Final   WBC, UA 11/12/2023 0-5  0 - 5 WBC/hpf Final   Bacteria, UA 11/12/2023 MANY (A)  NONE SEEN Final   Squamous Epithelial / HPF 11/12/2023 0-5  0 - 5 /HPF Final   Mucus 11/12/2023 PRESENT   Final   Performed at [facility], [address], [place], [place] [address]    Allergies: Patient has no known allergies.  Medications:  Facility Ordered Medications  Medication   acetaminophen  (TYLENOL ) tablet 650 mg   alum & mag hydroxide-simeth (MAALOX/MYLANTA) 200-200-20 MG/5ML suspension 30 mL   magnesium  hydroxide (MILK OF MAGNESIA) suspension 30 mL   haloperidol  (HALDOL ) tablet 5 mg   And   diphenhydrAMINE  (BENADRYL ) capsule 50 mg   haloperidol  lactate (HALDOL ) injection 5 mg   And   diphenhydrAMINE  (BENADRYL ) injection 50 mg   And   LORazepam  (ATIVAN ) injection 2 mg   haloperidol  lactate (HALDOL ) injection 10 mg   And   diphenhydrAMINE  (BENADRYL ) injection 50 mg   And   LORazepam  (ATIVAN ) injection 2 mg   traZODone  (DESYREL ) tablet 50 mg   topiramate  (TOPAMAX ) tablet 25 mg   nicotine  polacrilex (NICORETTE ) gum 2 mg   melatonin tablet 5 mg   PTA Medications  Medication Sig   naltrexone  (DEPADE) 50 MG tablet Take 1 tablet (50 mg total) by mouth daily. (Patient not taking: Reported on 05/11/2024)   traZODone  (DESYREL ) 50 MG tablet Take 1 tablet (50 mg total) by mouth at bedtime as needed for sleep. (Patient not taking: Reported on 05/11/2024)   sertraline  (ZOLOFT ) 50 MG tablet Take 1 tablet (50 mg total) by mouth daily. (Patient not  taking: Reported on 05/11/2024)   nicotine  (NICODERM CQ  - DOSED IN MG/24 HOURS) 21 mg/24hr patch Place 1 patch (21 mg total) onto the skin daily. (Patient not taking: Reported on 05/11/2024)   amoxicillin  (AMOXIL ) 500 MG capsule Take 2 capsules (1,000 mg total) by mouth 2 (two) times daily. (Patient not taking: Reported on 05/11/2024)   ibuprofen  (ADVIL ) 600 MG tablet Take 1 tablet (600 mg total) by mouth every 6 (six) hours as needed. (Patient not taking: Reported on 05/11/2024)   thiamine  (VITAMIN B-1) 100 MG tablet Take 1 tablet (100 mg total) by mouth daily. (Patient not taking: Reported on 05/11/2024)   Multiple Vitamin (MULTIVITAMIN WITH MINERALS) TABS tablet Take 1 tablet by mouth daily. (Patient not taking: Reported on 05/11/2024)      Medical Decision Making  Marlee has both depressed mood and substance dependence, and it is uncertain if this is substance induced, or if the substances were self medicating a longstanding issue with depression.     Recommendations  Long Term Goals: Improvement in symptoms so as ready for discharge   Short Term Goals: Patient will verbalize feelings in meetings with treatment team members., Patient will attend at  least of 50% of the groups daily., Pt will complete the PHQ9 on admission, day 3 and discharge., and Patient will take medications as prescribed daily.  Medications: Mood/anxiety: continue group therapy, milieu therapy, 1:1 evaluation with provider.  Medication management: started topamax  25mg  PO BID, for both addiction and for migraines.   Substance Abuse:  brief intervention provided abstinence advised.  stimulant use: encourage patient to maintain PO intake. Coverage PRN for secondary psychosis.  Alcohol use disorder: Replacing Thiamine . CIWA monitoring with benzodiazepine coverage per withdrawal scoring. Monitoring HR and BP.  Cannabis use disorder: Encouraging insight. Education provided on the effect of cannabis on mental health. Coverage for  withdrawal symptoms and nausea.  Nicotine  and tobacco use: Nicotine  replacement therapy provided.  counseling on cessation of nicotine  use provided Medical: PRNs for pain, constipation, indigestion available.  Labs/studies: AST 116, WBC 13.4. UDS + cocaine, cannabis.  Safety and Monitoring: voluntarily admission to [facility]/Facility based care unit [facility]) unit for safety, stabilization and treatment Daily contact with patient to assess and evaluate symptoms and progress in treatment Patient's case to be discussed in multi-disciplinary team meeting Observation Level : q15 minute checks Vital signs: q12 hours Precautions: withdrawal  Based on my evaluation the patient does not appear to have an emergency medical condition.   Corean Anette Potters, MD 05/11/24  2:43 PM

## 2024-05-11 NOTE — Group Note (Signed)
 Group Topic: Balance in Life  Group Date: 05/11/2024 Start Time: 2000 End Time: 2100 Facilitators: Luvenia Mae SAUNDERS, NT  Department: [facility]  Number of Participants: 6  Group Focus: relapse prevention Treatment Modality:  Leisure Development Interventions utilized were story telling Purpose: express feelings  Name: Carla Rollins Date of Birth: [birth year]  MR: [id number]    Level of Participation: active Quality of Participation: cooperative Interactions with others: gave feedback Mood/Affect: appropriate Triggers (if applicable): NA Cognition: coherent/clear Progress: Gaining insight Response: NA Plan: patient will be encouraged to continue to go to group  Patients Problems:  Patient Active Problem List   Diagnosis Date Noted   Migraine 05/11/2024   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

## 2024-05-11 NOTE — ED Notes (Signed)
 Patient is in the bedroom sleeping NAD. Will continue to monitor for safety.

## 2024-05-11 NOTE — ED Notes (Signed)
 Pt presents as pleasant cooperative and polite. Pt denies si hi and avh, verbal contract for safety provided. Pt ate breakfast. Pt c/o chronic L shoulder pain, level 8/10. Pt administered prn tylenol . Pt c/o poor sleep last night, saying she had symptoms of chills and shakes. Medication orders reviewed, questions denied.

## 2024-05-11 NOTE — ED Notes (Signed)
 Patient CIWA assessment could not be done as pt is asleep at this time

## 2024-05-11 NOTE — ED Notes (Signed)
 Pt ate lunch, reports 0/10 pain level at this time, pt denies other concerns or complaints.

## 2024-05-11 NOTE — ED Notes (Signed)
 Pt ate dinner, was provided with toiletries as requested. Pt generally calm and cooperative. To complaints or concerns reported to RN.

## 2024-05-11 NOTE — Group Note (Signed)
 Group Topic: Wellness  Group Date: 05/11/2024 Start Time: 0900 End Time: 1000 Facilitators: Daved Tinnie HERO, RN  Department: [facility]  Number of Participants: 8  Group Focus: nursing group Treatment Modality:  Psychoeducation Interventions utilized were patient education Purpose: relapse prevention strategies  Name: Carla Rollins Date of Birth: [birth year]  MR: [id number]    Level of Participation: active Quality of Participation: cooperative Interactions with others: gave feedback Mood/Affect: appropriate Triggers (if applicable): n/a Cognition: coherent/clear Progress: Gaining insight Response: RN reviewed and discussed medications with pt, questions denied.  Plan: patient will be encouraged to attend future RN education groups.   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

## 2024-05-11 NOTE — Group Note (Signed)
 Group Topic: Healthy Self Image and Positive Change  Group Date: 05/11/2024 Start Time: 0800 End Time: 0850 Facilitators: Lonzell Dwayne RAMAN, NT  Department: [facility]  Number of Participants: 3  Group Focus: anxiety Treatment Modality:  Psychoeducation Interventions utilized were patient education Purpose: reinforce self-care  Name: Carla Rollins Date of Birth: 25-Feb-1996  MR: [id number]    Level of Participation: Patient did not attend group Quality of Particapation: N/A Interactions with others: N/A Mood/Affect: N/A Triggers (if applicable): N/A Cognition: N/A Progress: N/A Response: N/A Plan: N/A  Patients Problems:  Patient Active Problem List   Diagnosis Date Noted   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

## 2024-05-11 NOTE — ED Notes (Signed)
 New medication order reviewed with pt, questions denied, pt declined handout. Pt ate lunch, denies complaints or concerns at this time.

## 2024-05-12 DIAGNOSIS — F1910 Other psychoactive substance abuse, uncomplicated: Secondary | ICD-10-CM

## 2024-05-12 DIAGNOSIS — F1210 Cannabis abuse, uncomplicated: Secondary | ICD-10-CM | POA: Diagnosis not present

## 2024-05-12 DIAGNOSIS — F1094 Alcohol use, unspecified with alcohol-induced mood disorder: Secondary | ICD-10-CM | POA: Diagnosis not present

## 2024-05-12 DIAGNOSIS — F1410 Cocaine abuse, uncomplicated: Secondary | ICD-10-CM | POA: Diagnosis not present

## 2024-05-12 DIAGNOSIS — F1994 Other psychoactive substance use, unspecified with psychoactive substance-induced mood disorder: Secondary | ICD-10-CM | POA: Diagnosis not present

## 2024-05-12 NOTE — ED Notes (Signed)
 Pt observed and assessed in the dayroom. Pt is pleasant and states she is feeling better than when she came in and doesn't have any complains now. Pt requested for trazodone , melatonin to help with sleep. Pt denies SI/HI/AVH. Q 15 safety checks in place.

## 2024-05-12 NOTE — Group Note (Signed)
 Group Topic: Relapse and Recovery  Group Date: 05/12/2024 Start Time: 1000 End Time: 1100 Facilitators: Alyse Leilani LABOR, NT  Department: [facility]  Number of Participants: 6  Group Focus: chemical dependency education and chemical dependency issues Treatment Modality:  Skills Training Interventions utilized were patient education Purpose: relapse prevention strategies  Name: Carla Rollins Date of Birth: [birth year]  MR: [id number]    Level of Participation: active Quality of Participation: cooperative Interactions with others: gave feedback Mood/Affect: brightens with interaction Triggers (if applicable): none Cognition: coherent/clear and goal directed Progress: Gaining insight Response: Good group Plan: patient will be encouraged to attend groups when she is discharged  Patients Problems:  Patient Active Problem List   Diagnosis Date Noted   Migraine 05/11/2024   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

## 2024-05-12 NOTE — Group Note (Signed)
 Group Topic: Social Support  Group Date: 05/12/2024 Start Time: 2015 End Time: 2045 Facilitators: Tanda Ogren D, NT  Department: [facility]  Number of Participants: 5  Group Focus: daily focus Treatment Modality:  Psychoeducation Interventions utilized were support Purpose: express feelings  Name: Carla Rollins Date of Birth: 12-23-1995  MR: [id number]    Level of Participation: moderate Quality of Participation: attentive Interactions with others: gave feedback Mood/Affect: appropriate Triggers (if applicable): n/a Cognition: coherent/clear Progress: Significant Response: n/a Plan: follow-up needed  Patients Problems:  Patient Active Problem List   Diagnosis Date Noted   Polysubstance abuse (HCC) 05/12/2024   Migraine 05/11/2024   

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
## 2024-05-12 NOTE — Group Note (Signed)
 Group Topic: Recovery Basics  Group Date: 05/12/2024 Start Time: 1300 End Time: 1400 Facilitators: Belle Camellia SAILOR, RN  Department: [facility]  Number of Participants: 4  Group Focus: substance abuse education Treatment Modality:  Psychoeducation Interventions utilized were patient education Purpose: relapse prevention strategies and substance abuse recovery education   Name: Carla Rollins Date of Birth: 1996-03-20  MR: [id number]    Level of Participation: active Quality of Participation: cooperative Interactions with others: gave feedback Mood/Affect: flat Triggers (if applicable): N/A Cognition: obsessive Progress: Minimal Response: n/a Plan: patient will be encouraged to continue with participation   Patients Problems:  Patient Active Problem List   Diagnosis Date Noted   Migraine 05/11/2024   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

## 2024-05-12 NOTE — Care Management (Signed)
 [facility] Care Management   Writer referred patient to [facility], [facility] and [facility].

## 2024-05-12 NOTE — ED Notes (Signed)
 Patient resting quietly in bed with eyes closed, Respirations equal and unlabored, skin warm and dry, NAD. Routine safety checks conducted according to facility protocol. Will continue to monitor for safety.

## 2024-05-13 DIAGNOSIS — F1020 Alcohol dependence, uncomplicated: Secondary | ICD-10-CM

## 2024-05-13 DIAGNOSIS — F1210 Cannabis abuse, uncomplicated: Secondary | ICD-10-CM | POA: Diagnosis not present

## 2024-05-13 DIAGNOSIS — F1410 Cocaine abuse, uncomplicated: Secondary | ICD-10-CM | POA: Diagnosis not present

## 2024-05-13 DIAGNOSIS — F1994 Other psychoactive substance use, unspecified with psychoactive substance-induced mood disorder: Secondary | ICD-10-CM | POA: Diagnosis not present

## 2024-05-13 DIAGNOSIS — F1094 Alcohol use, unspecified with alcohol-induced mood disorder: Secondary | ICD-10-CM | POA: Diagnosis not present

## 2024-05-13 LAB — URINALYSIS, ROUTINE W REFLEX MICROSCOPIC
Bilirubin Urine: NEGATIVE
Glucose, UA: NEGATIVE mg/dL
Ketones, ur: NEGATIVE mg/dL
Nitrite: POSITIVE — AB
Protein, ur: 30 mg/dL — AB
Specific Gravity, Urine: 1.018 (ref 1.005–1.030)
pH: 6 (ref 5.0–8.0)

## 2024-05-13 MED ORDER — TRAZODONE HCL 50 MG PO TABS: 50.0000 mg | ORAL_TABLET | Freq: Every day | ORAL | 0 refills | Status: AC

## 2024-05-13 MED ORDER — ADULT MULTIVITAMIN W/MINERALS CH: 1.0000 | ORAL_TABLET | Freq: Every day | ORAL | 1 refills | Status: AC

## 2024-05-13 MED ORDER — NALTREXONE HCL 50 MG PO TABS: 50.0000 mg | ORAL_TABLET | Freq: Every day | ORAL | 0 refills | Status: AC

## 2024-05-13 MED ORDER — MELATONIN 5 MG PO TABS: 5.0000 mg | ORAL_TABLET | Freq: Every day | ORAL | 0 refills | Status: AC

## 2024-05-13 MED ORDER — TOPIRAMATE 25 MG PO TABS: 25.0000 mg | ORAL_TABLET | Freq: Two times a day (BID) | ORAL | 0 refills | Status: AC

## 2024-05-13 MED ORDER — NALTREXONE HCL 50 MG PO TABS
50.0000 mg | ORAL_TABLET | Freq: Every day | ORAL | Status: DC
Start: 2024-05-13 — End: 2024-05-13
  Administered 2024-05-13: 50 mg via ORAL
  Filled 2024-05-13: qty 1

## 2024-05-13 NOTE — ED Notes (Signed)
 Patient sitting in dayroom interacting with peers. No acute distress noted. No concerns voiced. No inappropriate behaviors observed or reported at this time. Informed patient to notify staff with any needs or assistance. Patient verbalized understanding or agreement. Safety checks in place per facility policy.

## 2024-05-13 NOTE — ED Notes (Signed)
 Patient is in the bedroom sleeping NAD. Will continue to monitor for safety.

## 2024-05-13 NOTE — ED Notes (Signed)

## 2024-05-13 NOTE — Discharge Instructions (Addendum)
 Intake appointment CD-IOP 05/15/24 at 9:30 a.m. to the [facility] office. Initial appointment will be 90 minutes. The number for the Receptionist is [phone] and direct number for Elsie Maier LCSW is [phone].    # It is recommended to the patient to continue psychiatric and somatic medications as prescribed, after discharge.    # It is recommended to the patient to follow up with your outpatient psychiatric provider. Patient may self-refer to [facility] 2nd floor clinic. Please arrive before 7AM to sign up. Appointments are limited so the earlier you arrive the better the opportunity. #Patient should followup with PCP for somatic health conditions. # Testing: Follow-up with outpatient provider for abnormal lab results: proteinuria # It was discussed with the patient, the impact of alcohol, drugs, tobacco have been there overall psychiatric and medical wellbeing, and total abstinence from substance use was recommended.  # Prescriptions provided. Patient agreeable to plan. Given opportunity to ask questions. Appears to feel comfortable with discharge.  #Activity: Daily physical activity; reduce sedentary behaviors.  #Diet: Portion-limited diet rich in produce, whole (minimally processed) grains, nuts/seeds, beans/legumes, seafood, eggs, fermented food, lowfat dairy (if tolerated), lean meat. Copious water intake. Limit (ultra)processed foods and sugar-sweetened beverages.  # In the event of worsening symptoms, the patient is instructed to call your outpatient psychiatric provider, 911, [phone] or go to the nearest emergency department for appropriate evaluation and treatment of symptoms.  # Patient was discharged with a plan to follow up as noted previously.

## 2024-05-13 NOTE — Group Note (Signed)
 Group Topic: Recovery Basics  Group Date: 05/13/2024 Start Time: 1039 End Time: 1046 Facilitators: Quanasia Defino, Zane HERO, RN  Department: [facility]  Number of Participants: 1  Group Focus: discharge education Treatment Modality:  Individual Therapy Interventions utilized were patient education Purpose: increase insight  Name: Carla Rollins Date of Birth: 02-06-1996  MR: [id number]    Level of Participation: active Quality of Participation: attentive and cooperative Interactions with others: gave feedback Mood/Affect: appropriate Triggers (if applicable): None identified at this time Cognition: coherent/clear Progress: Significant Response: Patient voiced understanding of all discharge instructions as explained to him. Plan: patient will be encouraged to follow-up with discharge plans as stated in AVS  Patients Problems:  Patient Active Problem List   Diagnosis Date Noted   Alcohol use disorder, moderate, dependence (HCC) 05/13/2024   Polysubstance abuse (HCC) 05/12/2024   Migraine 05/11/2024   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

## 2024-05-13 NOTE — ED Notes (Signed)
 Patient discharged with CDIOP per MD order. After Visit Summary (AVS) printed and given to patient, as well as printed prescriptions. AVS reviewed with patient and all questions fully answered. Patient discharged in no acute distress, A& O x4 and ambulatory. Patient denied SI/HI, A/VH upon discharge. Patient verbalized understanding of all discharge instructions explained by staff, including follow up appointments, RX's and safety plan. Patient mood fair. Patient belongings returned to patient from locker #21 complete and intact. Patient escorted to lobby via staff for transport to destination. Safety maintained.

## 2024-05-13 NOTE — Care Management (Signed)
 [facility] Care Management   Writer referred patient to the CD-IOP program.
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
## 2024-05-15 ENCOUNTER — Ambulatory Visit (HOSPITAL_COMMUNITY): Admitting: Licensed Clinical Social Worker

## 2024-05-15 ENCOUNTER — Telehealth (HOSPITAL_COMMUNITY): Payer: Self-pay | Admitting: Licensed Clinical Social Worker

## 2024-05-15 NOTE — Telephone Encounter (Signed)
 The therapist called Carla Rollins confirming her identity via 2 identifiers.  She says that she is aware that she missed her appointment today and did not have the number to call but says that she wants to reschedule this appointment soon as possible so was given a 1 PM appointment on Monday the 13th at [facility].  Zell Maier, MA, LCSW, [facility], LCAS 05/15/2024

## 2024-05-19 ENCOUNTER — Ambulatory Visit (HOSPITAL_COMMUNITY): Payer: MEDICAID

## 2024-05-19 ENCOUNTER — Encounter (HOSPITAL_COMMUNITY): Payer: Self-pay

## 2024-05-19 DIAGNOSIS — F1020 Alcohol dependence, uncomplicated: Secondary | ICD-10-CM

## 2024-05-19 DIAGNOSIS — F1994 Other psychoactive substance use, unspecified with psychoactive substance-induced mood disorder: Secondary | ICD-10-CM

## 2024-05-19 DIAGNOSIS — F1420 Cocaine dependence, uncomplicated: Secondary | ICD-10-CM

## 2024-05-19 NOTE — Progress Notes (Signed)
 THERAPIST PROGRESS NOTE  Session Time:1:00 pm to 2-20 pm  Type of Therapy: Individual   Therapist Response/Interventions: Psychoeducation/Discussed the importance of being all in on your treatment.  Discussed readiness for change  given pt drank alcohol yesterday and smoked Delta 8 or 9 today. Discussed the important of a sober support system and encouraged Lova to get involved in 12 step meetings and obtain a sponsor.  Treatment Goals addressed: Problem: Substance Use  Dates: Start:  05/19/24    Disciplines: Interdisciplinary, PROVIDER  Goal: Hedwig will report completed abstinence from drugs and alcohol per self report while attending 12 step meetings, obtaining a sponsor and begin step work per self report  Dates: Start:  05/19/24   Expected End:  11/18/24    Disciplines: Interdisciplinary, PROVIDER  Goal: Jenyfer will decrease her depresion and anxiety by reporting her PHQ-9 and GAD-7 score to no higher than 4  Dates: Start:  05/19/24    Disciplines: Interdisciplinary, PROVIDER  Intervention: Therapist will education Carlynn abouts SUDS, patterns and consequences of use, relapse risks, the treatment process and types of mutual groups and provide early recovery and relapse prevention skills  Dates: Start:  05/19/24    Intervention: Therapist will assist Annalia in identifying thoughts and behaviors that can contribute to feelings of depression and anxiety  Dates: Start:  05/19/24    Description: Maeli gives this therapist verbal permission to electronically sign her Care Plan    Summary: Alexias presents requesting for entrance to [facility]. She had a CCA on 05/10/24.  Shaneese has been in [facility] for Detox. She says after she saw this therapist in March 2025 and then went to [facility] in [place] and she went to [facility] in [place] for 40 days. She says they have a program where a person can stay 18 months.  Saray says she stayed sober for 1 to 1 months after discharging from  [facility].  She says she starting working in June and she had moved in with her sister (who used). Sher relapsed and came to [facility]. After discharge from [facility], She says she could not stay sober living at her sisters so a female friend offered for her to come live with him and she took him up on his offer where she is currently living. She says he does not use anything.   PHQ-9 is 14 and GAD 7 is 12.  Name of Substance: Alcohol Age of first use: age 45 Amount using: 1 fifth of tequila a day Frequency of use: Daily  How long using: used less until a year ago and begin using 1/5 of tequilla per day.  13 years Last used: 05-1424 Withdrawal symptoms: none Longest period of sobriety: 2 weeks   Name of Substance: Cocaine Age of first use: 28 yrs old Amount using: $20 worth or 1/2 gram Frequency of use: Daily How long using:  Last used: ? Withdrawal symptoms: none Longest period of sobriety:  2 weeks   Ryelynn says sobriety is something she wants and desires.  Ilani got a call from the older man she is living with telling her he needed to leave.  Progress Towards Goals: initial  Suicidal/Homicidal:   Plan: Return again on 05-21-24  Diagnosis:   Collaboration of Care:   Patient/Guardian was advised Release of Information must be obtained prior to any record release in order to collaborate their care with an outside provider. Patient/Guardian was advised if they have not already done so to contact the registration department to sign  all necessary forms in order for us  to release information regarding their care.   Consent: Patient/Guardian gives verbal consent for treatment and assignment of benefits for services provided during this visit. Patient/Guardian expressed understanding and agreed to proceed.   Darice Hennie Gosa,MS.  LMFT, LCAS

## 2024-05-21 ENCOUNTER — Ambulatory Visit (HOSPITAL_COMMUNITY): Payer: MEDICAID

## 2024-07-02 ENCOUNTER — Ambulatory Visit: Payer: MEDICAID | Admitting: Family Medicine
